# Patient Record
Sex: Male | Born: 1955 | Race: White | Hispanic: Refuse to answer | Marital: Married | Smoking: Current some day smoker
Health system: Southern US, Community
[De-identification: ages and names within clinical notes are randomized; demographics above are authoritative.]

## PROBLEM LIST (undated history)

## (undated) DIAGNOSIS — Z86010 Personal history of colonic polyps: Secondary | ICD-10-CM

## (undated) DIAGNOSIS — I10 Essential (primary) hypertension: Secondary | ICD-10-CM

## (undated) DIAGNOSIS — E785 Hyperlipidemia, unspecified: Secondary | ICD-10-CM

## (undated) DIAGNOSIS — K603 Anal fistula, unspecified: Secondary | ICD-10-CM

## (undated) DIAGNOSIS — N189 Chronic kidney disease, unspecified: Secondary | ICD-10-CM

## (undated) DIAGNOSIS — U071 COVID-19: Secondary | ICD-10-CM

## (undated) DIAGNOSIS — I639 Cerebral infarction, unspecified: Secondary | ICD-10-CM

## (undated) DIAGNOSIS — E119 Type 2 diabetes mellitus without complications: Principal | ICD-10-CM

## (undated) DIAGNOSIS — J189 Pneumonia, unspecified organism: Secondary | ICD-10-CM

## (undated) DIAGNOSIS — Z8719 Personal history of other diseases of the digestive system: Secondary | ICD-10-CM

## (undated) DIAGNOSIS — C679 Malignant neoplasm of bladder, unspecified: Secondary | ICD-10-CM

## (undated) DIAGNOSIS — M1990 Unspecified osteoarthritis, unspecified site: Secondary | ICD-10-CM

## (undated) DIAGNOSIS — L732 Hidradenitis suppurativa: Secondary | ICD-10-CM

## (undated) DIAGNOSIS — L4050 Arthropathic psoriasis, unspecified: Secondary | ICD-10-CM

## (undated) DIAGNOSIS — M25519 Pain in unspecified shoulder: Secondary | ICD-10-CM

## (undated) DIAGNOSIS — Z87442 Personal history of urinary calculi: Secondary | ICD-10-CM

## (undated) HISTORY — DX: Chronic kidney disease, unspecified: N18.9

## (undated) HISTORY — DX: Cerebral infarction, unspecified: I63.9

## (undated) HISTORY — DX: Essential (primary) hypertension: I10

## (undated) HISTORY — PX: TONSILLECTOMY: SUR1361

## (undated) HISTORY — DX: Hidradenitis suppurativa: L73.2

## (undated) HISTORY — DX: Unspecified osteoarthritis, unspecified site: M19.90

## (undated) HISTORY — PX: COLONOSCOPY: SHX174

## (undated) HISTORY — DX: Hyperlipidemia, unspecified: E78.5

## (undated) HISTORY — DX: Pain in unspecified shoulder: M25.519

## (undated) HISTORY — DX: Personal history of colonic polyps: Z86.010

## (undated) HISTORY — DX: Malignant neoplasm of bladder, unspecified: C67.9

## (undated) HISTORY — DX: Type 2 diabetes mellitus without complications: E11.9

---

## 2007-06-06 DIAGNOSIS — Z8551 Personal history of malignant neoplasm of bladder: Secondary | ICD-10-CM

## 2007-06-06 HISTORY — DX: Personal history of malignant neoplasm of bladder: Z85.51

## 2007-06-06 HISTORY — PX: CYSTOSCOPY: SUR368

## 2008-02-04 HISTORY — PX: TRANSURETHRAL RESECTION OF BLADDER TUMOR: SHX2575

## 2009-01-25 ENCOUNTER — Encounter (INDEPENDENT_AMBULATORY_CARE_PROVIDER_SITE_OTHER): Payer: Self-pay | Admitting: *Deleted

## 2009-04-05 ENCOUNTER — Ambulatory Visit: Payer: Self-pay | Admitting: Family Medicine

## 2009-04-05 DIAGNOSIS — E119 Type 2 diabetes mellitus without complications: Secondary | ICD-10-CM

## 2009-04-05 DIAGNOSIS — E785 Hyperlipidemia, unspecified: Secondary | ICD-10-CM

## 2009-04-05 DIAGNOSIS — C679 Malignant neoplasm of bladder, unspecified: Secondary | ICD-10-CM | POA: Insufficient documentation

## 2009-04-05 DIAGNOSIS — M1990 Unspecified osteoarthritis, unspecified site: Secondary | ICD-10-CM

## 2009-04-05 DIAGNOSIS — I10 Essential (primary) hypertension: Secondary | ICD-10-CM | POA: Insufficient documentation

## 2009-04-05 DIAGNOSIS — Z8679 Personal history of other diseases of the circulatory system: Secondary | ICD-10-CM

## 2009-04-05 HISTORY — DX: Hyperlipidemia, unspecified: E78.5

## 2009-04-05 HISTORY — DX: Malignant neoplasm of bladder, unspecified: C67.9

## 2009-04-05 HISTORY — DX: Type 2 diabetes mellitus without complications: E11.9

## 2009-04-05 HISTORY — DX: Personal history of other diseases of the circulatory system: Z86.79

## 2009-04-05 HISTORY — DX: Unspecified osteoarthritis, unspecified site: M19.90

## 2009-04-05 HISTORY — DX: Essential (primary) hypertension: I10

## 2009-04-06 ENCOUNTER — Ambulatory Visit: Payer: Self-pay | Admitting: Family Medicine

## 2009-04-08 ENCOUNTER — Telehealth: Payer: Self-pay | Admitting: Family Medicine

## 2009-04-08 LAB — CONVERTED CEMR LAB
Cholesterol: 206 mg/dL — ABNORMAL HIGH (ref 0–200)
HDL: 37.2 mg/dL — ABNORMAL LOW (ref >39.00)
Triglycerides: 131 mg/dL (ref 0.0–149.0)

## 2009-05-03 ENCOUNTER — Encounter (INDEPENDENT_AMBULATORY_CARE_PROVIDER_SITE_OTHER): Payer: Self-pay | Admitting: *Deleted

## 2009-07-27 ENCOUNTER — Encounter: Payer: Self-pay | Admitting: Family Medicine

## 2009-08-25 ENCOUNTER — Encounter: Payer: Self-pay | Admitting: Family Medicine

## 2010-01-05 ENCOUNTER — Ambulatory Visit: Payer: Self-pay | Admitting: Family Medicine

## 2010-01-05 DIAGNOSIS — M25519 Pain in unspecified shoulder: Secondary | ICD-10-CM

## 2010-01-05 HISTORY — DX: Pain in unspecified shoulder: M25.519

## 2010-01-11 ENCOUNTER — Encounter: Payer: Self-pay | Admitting: Family Medicine

## 2010-03-01 ENCOUNTER — Telehealth: Payer: Self-pay | Admitting: Family Medicine

## 2010-07-05 NOTE — Letter (Signed)
Summary: [facility]  [facility]   Imported By: Maryln Gottron 01/19/2010 10:58:43  _____________________________________________________________________  External Attachment:    Type:   Image     Comment:   External Document

## 2010-07-05 NOTE — Miscellaneous (Signed)
Summary: Accu-Chek testing device dispensed  Pt wife is here with daughter for OV.  Pt has been experiencing evevated blood sugars.  Dr Justine Null requested pt be given a blood sugar testing kit.  Wife instructed on use, Accu-chek dispensed with 20 testing strips.  Pt will schedule OV with Dr Caryl Never in follow-up Sid Falcon LPN  August 25, 2009 10:55 AM  Clinical Lists Changes  Medications: Added new medication of ACCU-CHEK AVIVA  STRP (GLUCOSE BLOOD) as directed - Signed Added new medication of ACCU-CHEK SOFT TOUCH LANCETS  MISC (LANCETS) as directed - Signed Rx of ACCU-CHEK AVIVA  STRP (GLUCOSE BLOOD) as directed;  #50 x 3;  Signed;  Entered by: Sid Falcon LPN;  Authorized by: Evelena Peat MD;  Method used: Electronically to [facility] [id number], [place], [place]  [address], Ph: [phone], Fax: [phone] Rx of ACCU-CHEK SOFT TOUCH LANCETS  MISC (LANCETS) as directed;  #50 x 3;  Signed;  Entered by: Sid Falcon LPN;  Authorized by: Evelena Peat MD;  Method used: Electronically to [facility] [id number], [place], [place]  [address], Ph: [phone], Fax: [phone]    Prescriptions: ACCU-CHEK SOFT TOUCH LANCETS  MISC (LANCETS) as directed  #50 x 3   Entered by:   Sid Falcon LPN   Authorized by:   Evelena Peat MD   Signed by:   Sid Falcon LPN on 66/11/3014   Method used:   Electronically to        [facility] [address] [phone]* (retail)       [address] [place], [place]  [address]       Ph: [phone]       Fax: [phone]   RxID:   [id number] ACCU-CHEK AVIVA  STRP (GLUCOSE BLOOD) as directed  #50 x 3   Entered by:   Sid Falcon LPN   Authorized by:   Evelena Peat MD   Signed by:   Sid Falcon LPN on 85/46/2703   Method used:   Electronically to        [facility] [address] [phone]* (retail)       [address] [place], [place]  [address]       Ph: [phone]       Fax: [phone]   RxID:   [id number]

## 2010-07-05 NOTE — Assessment & Plan Note (Signed)
Summary: CONSULT RE: SHOULDER PAIN ONLY WHEN LIFTING ARM UP/CJR   Vital Signs:  Patient profile:   55 year old male Weight:      226 pounds Temp:     98.0 degrees F oral BP sitting:   110 / 70  (left arm) Cuff size:   large  Vitals Entered By: Sid Falcon LPN (January 05, 2010 11:07 AM) CC: left shoulder pain Is Patient Diabetic? Yes Did you bring your meter with you today? Yes   History of Present Illness: Right shoulder pain for 6 months. No injury. Some gradual worsening with time. Deep achy pain worse with internal rotation and abduction. Denies neck pain, weakness, or numbness. No significant night pain. Occasional Aleve but not sure if this helps. No prior history of shoulder problems  Allergies: 1)  Penicillin V Potassium (Penicillin V Potassium)  Past History:  Past Medical History: Last updated: 04/05/2009 Bladder cancer  2009 Hyperlipidemia Hypertension Kidney stones Hay fever, Alergies Diabetes mellitus, type II Osteoarthritis  Review of Systems      See HPI  Physical Exam  General:  Well-developed,well-nourished,in no acute distress; alert,appropriate and cooperative throughout examination Neck:  No deformities, masses, or tenderness noted. Lungs:  Normal respiratory effort, chest expands symmetrically. Lungs are clear to auscultation, no crackles or wheezes. Heart:  normal rate and regular rhythm.   Extremities:  shoulders are symmetric in appearance. No localized tenderness. Pain with abduction greater than 90 and internal rotation. No triceps or bicipital tenderness. No a.c. joint tenderness   Impression & Recommendations:  Problem # 1:  SHOULDER PAIN (ICD-719.41)  Probable impingement syndrome.  Discussed risks and benefits of steroid injection and pt consents.  Prepped skin with betadine and using sterile technique injected 40 mg depomedrol and 2 cc plain xylocaine without difficulty into subacromial space (post-lateral approach). His updated  medication list for this problem includes:    Ibuprofen 400 Mg Tabs (Ibuprofen) .Marland Kitchen... Take one (1) tablet by mouth three (3) times a day with food  Orders: Joint Aspirate / Injection, Large (20610) Depo- Medrol 40mg  (J1030)  Complete Medication List: 1)  Lisinopril 10 Mg Tabs (Lisinopril) .... Once daily 2)  Accu-chek Aviva Strp (Glucose blood) .... As directed 3)  Accu-chek Soft Touch Lancets Misc (Lancets) .... As directed 4)  Ibuprofen 400 Mg Tabs (Ibuprofen) .... Take one (1) tablet by mouth three (3) times a day with food  Patient Instructions: 1)  Call or touch base in 2 weeks if no improvement 2)  work on gentle range of motion

## 2010-07-05 NOTE — Letter (Signed)
Summary: [facility]  [facility]   Imported By: Sherian Rein 08/05/2009 13:30:15  _____________________________________________________________________  External Attachment:    Type:   Image     Comment:   External Document

## 2010-07-05 NOTE — Progress Notes (Signed)
Summary: REFILL REQUEST Lisinopril, dose question  Phone Note Refill Request Message from:  Patient on March 01, 2010 9:32 AM  Refills Requested: Medication #1:  LISINOPRIL 10 MG TABS once daily   Notes: Education officer, environmental...Marland KitchenMarland KitchenMarland Kitchen Pts wife adv that Dr Caryl Never told her husband that he could take 5mg  instead of 10mg .    Initial call taken by: Debbra Riding,  March 01, 2010 9:32 AM  Follow-up for Phone Call        I believe pt should have return OV, please confirm dose for pt. Sid Falcon LPN  March 01, 2010 11:20 AM We did discuss reducing lisinopril to one half tablet daily since BPs have been well controlled. Follow-up by: Evelena Peat MD,  March 01, 2010 12:37 PM    New/Updated Medications: LISINOPRIL 10 MG TABS (LISINOPRIL) 1/2 tab daily Prescriptions: LISINOPRIL 10 MG TABS (LISINOPRIL) 1/2 tab daily  #135 x 3   Entered by:   Sid Falcon LPN   Authorized by:   Evelena Peat MD   Signed by:   Sid Falcon LPN on 16/03/9603   Method used:   Faxed to ...       [facility] (retail)             , [place]         Ph: [phone]       Fax: [phone]   RxID:   [id number]

## 2011-05-03 ENCOUNTER — Ambulatory Visit (INDEPENDENT_AMBULATORY_CARE_PROVIDER_SITE_OTHER): Payer: 59 | Admitting: Family Medicine

## 2011-05-03 ENCOUNTER — Encounter: Payer: Self-pay | Admitting: Family Medicine

## 2011-05-03 DIAGNOSIS — E785 Hyperlipidemia, unspecified: Secondary | ICD-10-CM

## 2011-05-03 DIAGNOSIS — M1990 Unspecified osteoarthritis, unspecified site: Secondary | ICD-10-CM

## 2011-05-03 DIAGNOSIS — I10 Essential (primary) hypertension: Secondary | ICD-10-CM

## 2011-05-03 DIAGNOSIS — E119 Type 2 diabetes mellitus without complications: Secondary | ICD-10-CM

## 2011-05-03 LAB — LIPID PANEL
LDL Cholesterol: 107 mg/dL — ABNORMAL HIGH (ref 0–99)
Total CHOL/HDL Ratio: 4
VLDL: 19 mg/dL (ref 0.0–40.0)

## 2011-05-03 LAB — BASIC METABOLIC PANEL
BUN: 12 mg/dL (ref 6–23)
Chloride: 107 mEq/L (ref 96–112)
Creatinine, Ser: 0.9 mg/dL (ref 0.4–1.5)
Glucose, Bld: 125 mg/dL — ABNORMAL HIGH (ref 70–99)
Potassium: 4.9 mEq/L (ref 3.5–5.1)

## 2011-05-03 LAB — HEPATIC FUNCTION PANEL
Bilirubin, Direct: 0 mg/dL (ref 0.0–0.3)
Total Bilirubin: 0.7 mg/dL (ref 0.3–1.2)

## 2011-05-03 MED ORDER — GLUCOSE BLOOD VI STRP: ORAL_STRIP | Status: DC

## 2011-05-03 MED ORDER — MELOXICAM 15 MG PO TABS: 15.0000 mg | ORAL_TABLET | Freq: Every day | ORAL | Status: DC

## 2011-05-03 NOTE — Progress Notes (Signed)
  Subjective:    Patient ID: Joshua [name], male    DOB: 09-11-1955, 55 y.o.   MRN: [id number]  HPI  Medical followup. Patient has history of osteoarthritis, remote history of bladder cancer, type 2 diabetes, hyperlipidemia, and history of hypertension. He is currently taking no prescription medications. Not monitoring blood sugars. Needs test strips refilled. Has lost about 20 pounds due to exercise efforts. Overall feels well but is having some bilateral hand and foot pain. Most involving toes DIP and PIP joints of the hand. No signs of inflammation such as erythema or warmth. No other joint pains. Uses ibuprofen and Aleve with some relief. He has early morning stiffness and pain which improves with activity.  Patient denies any chest pains or dyspnea. No symptoms of hyper or hypoglycemia.  Past Medical History  Diagnosis Date  . BLADDER CANCER 04/05/2009  . DIABETES MELLITUS, TYPE II 04/05/2009  . HYPERLIPIDEMIA 04/05/2009  . HYPERTENSION 04/05/2009  . OSTEOARTHRITIS 04/05/2009  . SHOULDER PAIN 01/05/2010   Past Surgical History  Procedure Date  . Cystoscopy 2009    tumor on bladder    reports that he has quit smoking. His smoking use included Cigarettes. He has a 5 pack-year smoking history. He does not have any smokeless tobacco history on file. His alcohol and drug histories not on file. family history includes Cancer in his other and Hypertension in his other. Allergies  Allergen Reactions  . Penicillins     REACTION: hives, fever blisters      Review of Systems  Constitutional: Negative for fever, chills, fatigue and unexpected weight change.  Respiratory: Negative for cough and shortness of breath.   Cardiovascular: Negative for chest pain, palpitations and leg swelling.  Musculoskeletal: Positive for arthralgias.  Neurological: Negative for dizziness and headaches.  Psychiatric/Behavioral: Negative for dysphoric mood.       Objective:   Physical Exam  Constitutional:  He appears well-developed and well-nourished. No distress.  HENT:  Mouth/Throat: Oropharynx is clear and moist.  Neck: Neck supple. No thyromegaly present.  Cardiovascular: Normal rate and regular rhythm.   Pulmonary/Chest: Effort normal and breath sounds normal. No respiratory distress. He has no wheezes. He has no rales.  Musculoskeletal: Normal range of motion. He exhibits no edema and no tenderness.  Lymphadenopathy:    He has no cervical adenopathy.          Assessment & Plan:  #1 osteoarthritis most involving hands. Trial of Meloxicam 15 mg one daily and reviewed possible side effects #2 history of type 2 diabetes. Recheck A1c. Continue weight loss efforts.   test strips ordered #3 hyperlipidemia recheck lipids

## 2011-05-04 NOTE — Progress Notes (Signed)
Quick Note:  Pt informed ______ 

## 2011-07-03 ENCOUNTER — Telehealth: Payer: Self-pay | Admitting: Family Medicine

## 2011-07-03 MED ORDER — LISINOPRIL 5 MG PO TABS: 5.0000 mg | ORAL_TABLET | Freq: Every day | ORAL | Status: DC

## 2011-07-03 NOTE — Telephone Encounter (Signed)
Pt req refill of lisinopril (PRINIVIL,ZESTRIL) 5 MG tablet to [facility] 1 year script. Pls call in today. Pt will be out of med on Thursday.

## 2011-12-28 ENCOUNTER — Encounter: Payer: Self-pay | Admitting: Family Medicine

## 2011-12-28 ENCOUNTER — Ambulatory Visit (INDEPENDENT_AMBULATORY_CARE_PROVIDER_SITE_OTHER): Payer: 59 | Admitting: Family Medicine

## 2011-12-28 VITALS — BP 110/70 | Temp 98.6°F | Ht 68.5 in | Wt 219.0 lb

## 2011-12-28 DIAGNOSIS — E119 Type 2 diabetes mellitus without complications: Secondary | ICD-10-CM

## 2011-12-28 DIAGNOSIS — E785 Hyperlipidemia, unspecified: Secondary | ICD-10-CM

## 2011-12-28 DIAGNOSIS — M25539 Pain in unspecified wrist: Secondary | ICD-10-CM

## 2011-12-28 DIAGNOSIS — M25532 Pain in left wrist: Secondary | ICD-10-CM

## 2011-12-28 DIAGNOSIS — I10 Essential (primary) hypertension: Secondary | ICD-10-CM

## 2011-12-28 LAB — BASIC METABOLIC PANEL
CO2: 26 mEq/L (ref 19–32)
Calcium: 9.5 mg/dL (ref 8.4–10.5)
Creatinine, Ser: 0.9 mg/dL (ref 0.4–1.5)
Glucose, Bld: 105 mg/dL — ABNORMAL HIGH (ref 70–99)
Sodium: 139 mEq/L (ref 135–145)

## 2011-12-28 LAB — LIPID PANEL
HDL: 47.9 mg/dL (ref >39.00)
Triglycerides: 117 mg/dL (ref 0.0–149.0)

## 2011-12-28 MED ORDER — CLOTRIMAZOLE-BETAMETHASONE 1-0.05 % EX CREA: TOPICAL_CREAM | Freq: Two times a day (BID) | CUTANEOUS | Status: AC

## 2011-12-28 NOTE — Progress Notes (Signed)
  Subjective:    Patient ID: Joshua [name], male    DOB: 04-Sep-1955, 56 y.o.   MRN: [id number]  HPI  Patient seen for medical followup. History of type 2 diabetes, hypertension, and hyperlipidemia. Currently takes low-dose lisinopril 5 mg daily but no other medications. He has not been monitoring blood sugars recently. Last A1c 6.4%. No symptoms of hyperglycemia. No consistent exercise. Poor compliance with diet at times. Blood pressure has been well controlled. No orthostasis. Patient brings in form from his employer requesting basic things like height and weight along with blood sugar and lipids. Patient is fasting today.  He continues to have problems with joint pains mostly involving the hands and has recently had some increased left wrist pain. No edema. No warmth. Possibly related to golf injury. Location is ulnar aspect of wrist  Past Medical History  Diagnosis Date  . BLADDER CANCER 04/05/2009  . DIABETES MELLITUS, TYPE II 04/05/2009  . HYPERLIPIDEMIA 04/05/2009  . HYPERTENSION 04/05/2009  . OSTEOARTHRITIS 04/05/2009  . SHOULDER PAIN 01/05/2010   Past Surgical History  Procedure Date  . Cystoscopy 2009    tumor on bladder    reports that he has quit smoking. His smoking use included Cigarettes. He has a 5 pack-year smoking history. He does not have any smokeless tobacco history on file. His alcohol and drug histories not on file. family history includes Cancer in his other and Hypertension in his other. Allergies  Allergen Reactions  . Penicillins     REACTION: hives, fever blisters      Review of Systems  Constitutional: Negative for fatigue.  Eyes: Negative for visual disturbance.  Respiratory: Negative for cough, chest tightness and shortness of breath.   Cardiovascular: Negative for chest pain, palpitations and leg swelling.  Neurological: Negative for dizziness, syncope, weakness, light-headedness and headaches.       Objective:   Physical Exam  Constitutional: He  appears well-developed and well-nourished.  Cardiovascular: Normal rate and regular rhythm.   Pulmonary/Chest: Effort normal and breath sounds normal. No respiratory distress. He has no wheezes. He has no rales.  Musculoskeletal: He exhibits no edema.       Left wrist reveals full range of motion. Minimal tenderness just distal to left ulna. No bony tenderness. No warmth or erythema.          Assessment & Plan:  #1type 2 diabetes. Diet controlled. Recheck A1c. Establish more consistent exercise  #2 hypertension adequate control  Continue low-dose lisinopril  #3 history of hyperlipidemia. Recheck lipid and hepatic panel  #4 left wrist pain. Question ligamentous strain. Try over-the-counter wrist splint and continue Aleve and consider orthopedic referral if not improving over the next few weeks

## 2012-01-02 NOTE — Progress Notes (Signed)
Quick Note:  Labs mailed to pt home to complete form ______

## 2012-07-17 ENCOUNTER — Other Ambulatory Visit: Payer: Self-pay | Admitting: *Deleted

## 2012-07-17 MED ORDER — LISINOPRIL 5 MG PO TABS: 5.0000 mg | ORAL_TABLET | Freq: Every day | ORAL | Status: DC

## 2012-07-18 ENCOUNTER — Ambulatory Visit: Payer: 59 | Admitting: Family Medicine

## 2012-07-26 ENCOUNTER — Ambulatory Visit: Payer: 59 | Admitting: Family Medicine

## 2012-08-01 ENCOUNTER — Encounter: Payer: Self-pay | Admitting: Family Medicine

## 2012-08-01 ENCOUNTER — Ambulatory Visit (INDEPENDENT_AMBULATORY_CARE_PROVIDER_SITE_OTHER): Payer: 59 | Admitting: Family Medicine

## 2012-08-01 VITALS — BP 122/78 | Temp 98.0°F | Wt 228.0 lb

## 2012-08-01 DIAGNOSIS — I10 Essential (primary) hypertension: Secondary | ICD-10-CM

## 2012-08-01 MED ORDER — LISINOPRIL 5 MG PO TABS: 5.0000 mg | ORAL_TABLET | Freq: Every day | ORAL | Status: DC

## 2012-08-01 NOTE — Progress Notes (Signed)
  Subjective:    Patient ID: Joshua [name], male    DOB: [birth year], 58 y.o.   MRN: [id number]  HPI Followup hypertension. Blood pressure controlled with very low-dose lisinopril 5 mg daily Has tried in the past discontinuing this but blood pressures rebounded. Generally feels well. Does have history of type 2 diabetes which is been managed with diet In consistent exercise. Last A1c 6.6%. Preparing to have labs through his work including A1c. Weight is up and down  Past Medical History  Diagnosis Date  . BLADDER CANCER 04/05/2009  . DIABETES MELLITUS, TYPE II 04/05/2009  . HYPERLIPIDEMIA 04/05/2009  . HYPERTENSION 04/05/2009  . OSTEOARTHRITIS 04/05/2009  . SHOULDER PAIN 01/05/2010   Past Surgical History  Procedure Laterality Date  . Cystoscopy  2009    tumor on bladder    reports that he has quit smoking. His smoking use included Cigarettes. He has a 5 pack-year smoking history. He does not have any smokeless tobacco history on file. His alcohol and drug histories are not on file. family history includes Cancer in his other and Hypertension in his other. Allergies  Allergen Reactions  . Penicillins     REACTION: hives, fever blisters      Review of Systems  Constitutional: Negative for chills, fatigue and unexpected weight change.  Eyes: Negative for visual disturbance.  Respiratory: Negative for cough, chest tightness and shortness of breath.   Cardiovascular: Negative for chest pain, palpitations and leg swelling.  Neurological: Negative for dizziness, syncope, weakness, light-headedness and headaches.       Objective:   Physical Exam  Constitutional: He appears well-developed and well-nourished.  Neck: Neck supple. No thyromegaly present.  Cardiovascular: Normal rate and regular rhythm.   Pulmonary/Chest: Effort normal and breath sounds normal. No respiratory distress. He has no wheezes. He has no rales.  Musculoskeletal: He exhibits no edema.  Lymphadenopathy:    He  has no cervical adenopathy.          Assessment & Plan:  Hypertension. Well controlled. Refill lisinopril for one year. Patient will have his upcoming lab work next week faxed over for [place] to review

## 2012-09-26 ENCOUNTER — Other Ambulatory Visit: Payer: Self-pay | Admitting: Family Medicine

## 2013-06-05 HISTORY — PX: EXTRACORPOREAL SHOCK WAVE LITHOTRIPSY: SHX1557

## 2013-12-17 ENCOUNTER — Other Ambulatory Visit (INDEPENDENT_AMBULATORY_CARE_PROVIDER_SITE_OTHER): Payer: 59

## 2013-12-17 DIAGNOSIS — Z0000 Encounter for general adult medical examination without abnormal findings: Secondary | ICD-10-CM

## 2013-12-17 LAB — POCT URINALYSIS DIPSTICK
Bilirubin, UA: NEGATIVE
Glucose, UA: NEGATIVE
KETONES UA: NEGATIVE
Leukocytes, UA: NEGATIVE
Nitrite, UA: NEGATIVE
PH UA: 6
RBC UA: NEGATIVE
SPEC GRAV UA: 1.02
UROBILINOGEN UA: 0.2

## 2013-12-17 LAB — LIPID PANEL
CHOL/HDL RATIO: 3
CHOLESTEROL: 164 mg/dL (ref 0–200)
HDL: 47.7 mg/dL (ref >39.00)
LDL CALC: 104 mg/dL — AB (ref 0–99)
NonHDL: 116.3
TRIGLYCERIDES: 63 mg/dL (ref 0.0–149.0)
VLDL: 12.6 mg/dL (ref 0.0–40.0)

## 2013-12-17 LAB — CBC WITH DIFFERENTIAL/PLATELET
BASOS PCT: 0.4 % (ref 0.0–3.0)
Basophils Absolute: 0 10*3/uL (ref 0.0–0.1)
EOS PCT: 0.8 % (ref 0.0–5.0)
Eosinophils Absolute: 0.1 10*3/uL (ref 0.0–0.7)
HCT: 46.3 % (ref 39.0–52.0)
Hemoglobin: 15.5 g/dL (ref 13.0–17.0)
LYMPHS PCT: 23.5 % (ref 12.0–46.0)
Lymphs Abs: 1.8 10*3/uL (ref 0.7–4.0)
MCHC: 33.5 g/dL (ref 30.0–36.0)
MCV: 93.3 fl (ref 78.0–100.0)
MONOS PCT: 4.5 % (ref 3.0–12.0)
Monocytes Absolute: 0.3 10*3/uL (ref 0.1–1.0)
NEUTROS PCT: 70.8 % (ref 43.0–77.0)
Neutro Abs: 5.4 10*3/uL (ref 1.4–7.7)
PLATELETS: 237 10*3/uL (ref 150.0–400.0)
RBC: 4.96 Mil/uL (ref 4.22–5.81)
RDW: 12.7 % (ref 11.5–15.5)
WBC: 7.6 10*3/uL (ref 4.0–10.5)

## 2013-12-17 LAB — HEPATIC FUNCTION PANEL
ALBUMIN: 4.1 g/dL (ref 3.5–5.2)
ALK PHOS: 85 U/L (ref 39–117)
ALT: 23 U/L (ref 0–53)
AST: 26 U/L (ref 0–37)
Bilirubin, Direct: 0.1 mg/dL (ref 0.0–0.3)
TOTAL PROTEIN: 7.6 g/dL (ref 6.0–8.3)
Total Bilirubin: 0.7 mg/dL (ref 0.2–1.2)

## 2013-12-17 LAB — BASIC METABOLIC PANEL
BUN: 17 mg/dL (ref 6–23)
CALCIUM: 9.9 mg/dL (ref 8.4–10.5)
CHLORIDE: 103 meq/L (ref 96–112)
CO2: 25 mEq/L (ref 19–32)
CREATININE: 1 mg/dL (ref 0.4–1.5)
GFR: 82.46 mL/min (ref >60.00)
Glucose, Bld: 148 mg/dL — ABNORMAL HIGH (ref 70–99)
Potassium: 4.8 mEq/L (ref 3.5–5.1)
SODIUM: 139 meq/L (ref 135–145)

## 2013-12-17 LAB — PSA: PSA: 1.63 ng/mL (ref 0.10–4.00)

## 2013-12-17 LAB — TSH: TSH: 1.67 u[IU]/mL (ref 0.35–4.50)

## 2013-12-26 ENCOUNTER — Ambulatory Visit (INDEPENDENT_AMBULATORY_CARE_PROVIDER_SITE_OTHER): Payer: 59 | Admitting: Family Medicine

## 2013-12-26 ENCOUNTER — Encounter: Payer: Self-pay | Admitting: Family Medicine

## 2013-12-26 VITALS — BP 120/70 | HR 63 | Temp 98.6°F | Ht 68.5 in | Wt 193.0 lb

## 2013-12-26 DIAGNOSIS — Z23 Encounter for immunization: Secondary | ICD-10-CM

## 2013-12-26 DIAGNOSIS — Z0000 Encounter for general adult medical examination without abnormal findings: Secondary | ICD-10-CM

## 2013-12-26 DIAGNOSIS — E119 Type 2 diabetes mellitus without complications: Secondary | ICD-10-CM

## 2013-12-26 LAB — HEMOGLOBIN A1C: Hgb A1c MFr Bld: 5.9 % (ref 4.6–6.5)

## 2013-12-26 NOTE — Progress Notes (Signed)
Pre visit review using our clinic review tool, if applicable. No additional management support is needed unless otherwise documented below in the visit note. 

## 2013-12-26 NOTE — Patient Instructions (Signed)
We will call you regarding screening colonoscopy.

## 2013-12-26 NOTE — Progress Notes (Signed)
   Subjective:    Patient ID: Joshua [name], male    DOB: [birth year], 58 y.o.   MRN: [id number]  HPI Patient seen for complete physical. He has history of type 2 diabetes, hypertension, dyslipidemia. He also has history of bladder cancer and is followed closely by urology. He has osteoarthritis mostly involving hands and feet. He has made dramatic lifestyle changes during the past year. Has lost about 45 pounds and blood sugars have tremendously improved. He has tapered himself off lisinopril for hypertension. He currently runs 3-4 miles most days of the week. Last tetanus unknown.  Smoked only briefly several years ago. Fasting blood sugars stable between 100 to 125 usually. No symptoms of hyperglycemia. He has never had screening colonoscopy.  Past Medical History  Diagnosis Date  . BLADDER CANCER 04/05/2009  . DIABETES MELLITUS, TYPE II 04/05/2009  . HYPERLIPIDEMIA 04/05/2009  . HYPERTENSION 04/05/2009  . OSTEOARTHRITIS 04/05/2009  . SHOULDER PAIN 01/05/2010   Past Surgical History  Procedure Laterality Date  . Cystoscopy  2009    tumor on bladder    reports that he has quit smoking. His smoking use included Cigarettes. He has a 5 pack-year smoking history. He does not have any smokeless tobacco history on file. His alcohol and drug histories are not on file. family history includes Cancer in his other; Hypertension in his other. Allergies  Allergen Reactions  . Penicillins     REACTION: hives, fever blisters      Review of Systems  Constitutional: Negative for fever, activity change, appetite change and fatigue.  HENT: Negative for congestion, ear pain and trouble swallowing.   Eyes: Negative for pain and visual disturbance.  Respiratory: Negative for cough, shortness of breath and wheezing.   Cardiovascular: Negative for chest pain and palpitations.  Gastrointestinal: Negative for nausea, vomiting, abdominal pain, diarrhea, constipation, blood in stool, abdominal distention and  rectal pain.  Genitourinary: Negative for dysuria, hematuria and testicular pain.  Musculoskeletal: Negative for arthralgias and joint swelling.  Skin: Negative for rash.  Neurological: Negative for dizziness, syncope and headaches.  Hematological: Negative for adenopathy.  Psychiatric/Behavioral: Negative for confusion and dysphoric mood.       Objective:   Physical Exam  Constitutional: He is oriented to person, place, and time. He appears well-developed and well-nourished. No distress.  HENT:  Head: Normocephalic and atraumatic.  Right Ear: External ear normal.  Left Ear: External ear normal.  Mouth/Throat: Oropharynx is clear and moist.  Eyes: Conjunctivae and EOM are normal. Pupils are equal, round, and reactive to light.  Neck: Normal range of motion. Neck supple. No thyromegaly present.  Cardiovascular: Normal rate, regular rhythm and normal heart sounds.   No murmur heard. Pulmonary/Chest: No respiratory distress. He has no wheezes. He has no rales.  Abdominal: Soft. Bowel sounds are normal. He exhibits no distension and no mass. There is no tenderness. There is no rebound and no guarding.  Genitourinary:  Per urology  Musculoskeletal: He exhibits no edema.  Lymphadenopathy:    He has no cervical adenopathy.  Neurological: He is alert and oriented to person, place, and time. He displays normal reflexes. No cranial nerve deficit.  Skin: No rash noted.  Psychiatric: He has a normal mood and affect.          Assessment & Plan:  Complete physical. Labs reviewed. Unfortunately, A1c was not obtained. Add A1c. Continue healthy lifestyle habits. Tetanus booster given. Schedule screening colonoscopy. Continue close followup with urology

## 2013-12-31 ENCOUNTER — Encounter: Payer: Self-pay | Admitting: Internal Medicine

## 2014-02-25 ENCOUNTER — Ambulatory Visit (AMBULATORY_SURGERY_CENTER): Payer: Self-pay

## 2014-02-25 VITALS — Ht 68.0 in | Wt 187.6 lb

## 2014-02-25 DIAGNOSIS — Z1211 Encounter for screening for malignant neoplasm of colon: Secondary | ICD-10-CM

## 2014-02-25 NOTE — Progress Notes (Signed)
No allergies to eggs or soy No past problems with anesthesia No home oxygen No diet/weight loss meds  Has email  Emmi instructions given for colonoscopy 

## 2014-03-12 ENCOUNTER — Encounter: Payer: Self-pay | Admitting: Internal Medicine

## 2014-03-12 ENCOUNTER — Ambulatory Visit (AMBULATORY_SURGERY_CENTER): Payer: 59 | Admitting: Internal Medicine

## 2014-03-12 VITALS — BP 110/64 | HR 52 | Temp 96.8°F | Resp 13 | Ht 68.0 in | Wt 187.0 lb

## 2014-03-12 DIAGNOSIS — D123 Benign neoplasm of transverse colon: Secondary | ICD-10-CM

## 2014-03-12 DIAGNOSIS — Z1211 Encounter for screening for malignant neoplasm of colon: Secondary | ICD-10-CM

## 2014-03-12 DIAGNOSIS — Z860100 Personal history of colon polyps, unspecified: Secondary | ICD-10-CM | POA: Insufficient documentation

## 2014-03-12 HISTORY — DX: Personal history of colonic polyps: Z86.010

## 2014-03-12 HISTORY — DX: Personal history of colon polyps, unspecified: Z86.0100

## 2014-03-12 MED ORDER — SODIUM CHLORIDE 0.9 % IV SOLN: 500.0000 mL | INTRAVENOUS | Status: DC

## 2014-03-12 NOTE — Op Note (Signed)
[facility]  [address] [place], [address]   COLONOSCOPY PROCEDURE REPORT  PATIENT: Joshua [name], Joshua [name]  MR#: [id number] BIRTHDATE: 1955/08/31 , 21  yrs. old GENDER: male ENDOSCOPIST: Gatha Mayer, MD, [facility] PROCEDURE DATE:  03/12/2014 PROCEDURE:   Colonoscopy with snare polypectomy First Screening Colonoscopy - Avg.  risk and is 50 yrs.  old or older Yes.  Prior Negative Screening - Now for repeat screening. N/A  History of Adenoma - Now for follow-up colonoscopy & has been > or = to 3 yrs.  N/A ASA CLASS:   Class II INDICATIONS:average risk for colorectal cancer and first colonoscopy. MEDICATIONS: Propofol 230 mg IV and Monitored anesthesia care  DESCRIPTION OF PROCEDURE:   After the risks benefits and alternatives of the procedure were thoroughly explained, informed consent was obtained.  The digital rectal exam revealed no abnormalities of the rectum, revealed no prostatic nodules, and revealed the prostate was not enlarged.   The [id number] endoscope was introduced through the anus and advanced to the   . No adverse events experienced.   The quality of the prep was excellent, using MiraLax  The instrument was then slowly withdrawn as the colon was fully examined.   COLON FINDINGS: A sessile polyp measuring 7 mm in size was found in the transverse colon.  A polypectomy was performed with a cold snare.  The resection was complete, the polyp tissue was completely retrieved and sent to histology.   There was moderate diverticulosis noted in the sigmoid colon.   The examination was otherwise normal.   Right colon retroflexion included.  Retroflexed views revealed no abnormalities. The time to cecum=5 minutes 16 seconds.  Withdrawal time=6 minutes 32 seconds.  The scope was withdrawn and the procedure completed. COMPLICATIONS: There were no immediate complications.  ENDOSCOPIC IMPRESSION: 1.   Sessile polyp measuring 7 mm in size was found in  the transverse colon; polypectomy was performed with a cold snare 2.   Moderate diverticulosis was noted in the sigmoid colon 3.   The examination was otherwise normal  RECOMMENDATIONS: Timing of repeat colonoscopy will be determined by pathology findings.  eSigned:  Gatha Mayer, MD, [facility] 03/12/2014 11:14 AM   cc: Carolann Littler, MD and The Patient

## 2014-03-12 NOTE — Patient Instructions (Addendum)
I found and removed 1 polyp that looks benign. You also have a condition called diverticulosis - common and not usually a problem. Please read the handout provided.  I will let you know pathology results and when to have another routine colonoscopy by mail.  I appreciate the opportunity to care for you. Gatha Mayer, MD, FACG  YOU HAD AN ENDOSCOPIC PROCEDURE TODAY AT [facility]: Refer to the procedure report that was given to you for any specific questions about what was found during the examination.  If the procedure report does not answer your questions, please call your gastroenterologist to clarify.  If you requested that your care partner not be given the details of your procedure findings, then the procedure report has been included in a sealed envelope for you to review at your convenience later.  YOU SHOULD EXPECT: Some feelings of bloating in the abdomen. Passage of more gas than usual.  Walking can help get rid of the air that was put into your GI tract during the procedure and reduce the bloating. If you had a lower endoscopy (such as a colonoscopy or flexible sigmoidoscopy) you may notice spotting of blood in your stool or on the toilet paper. If you underwent a bowel prep for your procedure, then you may not have a normal bowel movement for a few days.  DIET: Your first meal following the procedure should be a light meal and then it is ok to progress to your normal diet.  A half-sandwich or bowl of soup is an example of a good first meal.  Heavy or fried foods are harder to digest and may make you feel nauseous or bloated.  Likewise meals heavy in dairy and vegetables can cause extra gas to form and this can also increase the bloating.  Drink plenty of fluids but you should avoid alcoholic beverages for 24 hours.  ACTIVITY: Your care partner should take you home directly after the procedure.  You should plan to take it easy, moving slowly for the rest of the  day.  You can resume normal activity the day after the procedure however you should NOT DRIVE or use heavy machinery for 24 hours (because of the sedation medicines used during the test).    SYMPTOMS TO REPORT IMMEDIATELY: A gastroenterologist can be reached at any hour.  During normal business hours, 8:30 AM to 5:00 PM Monday through Friday, call [phone].  After hours and on weekends, please call the GI answering service at [phone] who will take a message and have the physician on call contact you.   Following lower endoscopy (colonoscopy or flexible sigmoidoscopy):  Excessive amounts of blood in the stool  Significant tenderness or worsening of abdominal pains  Swelling of the abdomen that is new, acute  Fever of 100F or higher  FOLLOW UP: If any biopsies were taken you will be contacted by phone or by letter within the next 1-3 weeks.  Call your gastroenterologist if you have not heard about the biopsies in 3 weeks.  Our staff will call the home number listed on your records the next business day following your procedure to check on you and address any questions or concerns that you may have at that time regarding the information given to you following your procedure. This is a courtesy call and so if there is no answer at the home number and we have not heard from you through the emergency physician on call, we will  assume that you have returned to your regular daily activities without incident.  SIGNATURES/CONFIDENTIALITY: You and/or your care partner have signed paperwork which will be entered into your electronic medical record.  These signatures attest to the fact that that the information above on your After Visit Summary has been reviewed and is understood.  Full responsibility of the confidentiality of this discharge information lies with you and/or your care-partner.  Await pathology  Please continue your normal medications  Please read over handouts about polyps and  diverticulosis

## 2014-03-12 NOTE — Progress Notes (Signed)
Called to room to assist during endoscopic procedure.  Patient ID and intended procedure confirmed with present staff. Received instructions for my participation in the procedure from the performing physician.  

## 2014-03-12 NOTE — Progress Notes (Signed)
Report to PACU, RN, vss, BBS= Clear.  

## 2014-03-13 ENCOUNTER — Telehealth: Payer: Self-pay | Admitting: *Deleted

## 2014-03-13 NOTE — Telephone Encounter (Signed)
  Follow up Call-  Call back number 03/12/2014  Post procedure Call Back phone  # [phone] cell  Permission to leave phone message Yes     Patient questions:  Do you have a fever, pain , or abdominal swelling? No. Pain Score  0 *  Have you tolerated food without any problems? Yes.    Have you been able to return to your normal activities? Yes.    Do you have any questions about your discharge instructions: Diet   No. Medications  No. Follow up visit  No.  Do you have questions or concerns about your Care? No.  Actions: * If pain score is 4 or above: No action needed, pain <4.

## 2014-03-18 ENCOUNTER — Encounter: Payer: Self-pay | Admitting: Internal Medicine

## 2014-03-24 ENCOUNTER — Other Ambulatory Visit: Payer: Self-pay | Admitting: Urology

## 2014-04-03 ENCOUNTER — Encounter (HOSPITAL_COMMUNITY): Payer: Self-pay | Admitting: Pharmacy Technician

## 2014-04-06 ENCOUNTER — Ambulatory Visit (HOSPITAL_COMMUNITY): Payer: 59

## 2014-04-06 ENCOUNTER — Encounter (HOSPITAL_COMMUNITY): Payer: Self-pay | Admitting: *Deleted

## 2014-04-06 ENCOUNTER — Encounter (HOSPITAL_COMMUNITY)
Admission: RE | Disposition: A | Discharge status: Home / Self Care | Payer: Self-pay | Source: Ambulatory Visit | Attending: Urology

## 2014-04-06 ENCOUNTER — Ambulatory Visit (HOSPITAL_COMMUNITY)
Admission: RE | Admit: 2014-04-06 | Discharge: 2014-04-06 | Disposition: A | Discharge status: Home / Self Care | Payer: 59 | Source: Ambulatory Visit | Attending: Urology | Admitting: Urology

## 2014-04-06 DIAGNOSIS — Z8551 Personal history of malignant neoplasm of bladder: Secondary | ICD-10-CM | POA: Insufficient documentation

## 2014-04-06 DIAGNOSIS — I10 Essential (primary) hypertension: Secondary | ICD-10-CM | POA: Diagnosis not present

## 2014-04-06 DIAGNOSIS — M1990 Unspecified osteoarthritis, unspecified site: Secondary | ICD-10-CM | POA: Diagnosis not present

## 2014-04-06 DIAGNOSIS — E785 Hyperlipidemia, unspecified: Secondary | ICD-10-CM | POA: Insufficient documentation

## 2014-04-06 DIAGNOSIS — N200 Calculus of kidney: Secondary | ICD-10-CM | POA: Diagnosis present

## 2014-04-06 DIAGNOSIS — E119 Type 2 diabetes mellitus without complications: Secondary | ICD-10-CM | POA: Diagnosis not present

## 2014-04-06 DIAGNOSIS — Z87891 Personal history of nicotine dependence: Secondary | ICD-10-CM | POA: Insufficient documentation

## 2014-04-06 SURGERY — LITHOTRIPSY, ESWL
Anesthesia: LOCAL | Laterality: Right

## 2014-04-06 MED ORDER — OXYCODONE-ACETAMINOPHEN 5-325 MG PO TABS
1.0000 | ORAL_TABLET | Freq: Once | ORAL | Status: AC
  Administered 2014-04-06: 1 via ORAL
  Filled 2014-04-06: qty 1

## 2014-04-06 MED ORDER — DIPHENHYDRAMINE HCL 25 MG PO CAPS
25.0000 mg | ORAL_CAPSULE | ORAL | Status: AC
  Administered 2014-04-06: 25 mg via ORAL
  Filled 2014-04-06: qty 1

## 2014-04-06 MED ORDER — DIAZEPAM 5 MG PO TABS
10.0000 mg | ORAL_TABLET | ORAL | Status: AC
  Administered 2014-04-06: 10 mg via ORAL
  Filled 2014-04-06: qty 2

## 2014-04-06 MED ORDER — CIPROFLOXACIN HCL 500 MG PO TABS
500.0000 mg | ORAL_TABLET | ORAL | Status: AC
Start: 2014-04-06 — End: 2014-04-06
  Administered 2014-04-06: 500 mg via ORAL
  Filled 2014-04-06: qty 1

## 2014-04-06 MED ORDER — OXYCODONE-ACETAMINOPHEN 10-325 MG PO TABS: 1.0000 | ORAL_TABLET | ORAL | Status: DC | PRN

## 2014-04-06 MED ORDER — OXYCODONE HCL 5 MG PO TABS
5.0000 mg | ORAL_TABLET | Freq: Once | ORAL | Status: AC
Start: 2014-04-06 — End: 2014-04-06
  Administered 2014-04-06: 5 mg via ORAL
  Filled 2014-04-06: qty 1

## 2014-04-06 MED ORDER — SODIUM CHLORIDE 0.9 % IV SOLN
INTRAVENOUS | Status: DC
  Administered 2014-04-06: 1000 mL via INTRAVENOUS

## 2014-04-06 MED ORDER — TAMSULOSIN HCL 0.4 MG PO CAPS: 0.4000 mg | ORAL_CAPSULE | ORAL | Status: DC

## 2014-04-06 NOTE — Op Note (Signed)
See [facility] OP note scanned into chart. 

## 2014-04-06 NOTE — H&P (Signed)
History of Present Illness History of transitional cell carcinoma of the bladder - This was diagnosed in 9/09 after an episode of gross hematuria and treated with transurethral resection. The pathology revealed low grade, superficial transitional cell carcinoma. He has not had a recurrence.        He has a history of nephrolithiasis that was diagnosed at the time of his workup for his hematuria in '09. Bilateral stones were identified but he has never passed a stone nor has he had any flank pain. On KUB he has a single stone seen in the lower pole of his right kidney and no stones can be seen on the left-hand side.        There is a family history of prostate cancer. His brother was diagnosed with this disease.     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  Current therapy: Cialis 5 mg    Interval history: He reports that about 2 months ago he noted some rust-colored urine. He then had a similar episode about 2 weeks ago and then yesterday, after going out for a run, he noted grossly bloody urine. He has not been having any flank pain. He denies any new voiding symptoms. His gross hematuria would be considered moderate severity with no modifying factors or associated signs and symptoms.   Past Medical History Problems  1. History of Diabetes mellitus type 2, controlled, without complications (Y85.0) 2. Former smoker [phone]) 3. History of hyperlipidemia (Z86.39) 4. History of hypertension (Z86.79) 5. History of Osteoarthritis 6. 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 History of Cystoscopy Bladder Tumor  Current Meds 1. Aleve TABS;  Therapy: (Recorded:28Oct2014) to Recorded 2. Cialis 5 MG Oral Tablet; TAKE AS DIRECTED;  Therapy: 04Apr2012 to (Last Rx:04Apr2012) Ordered 3. Diazepam 10 MG Oral  Tablet; USE AS DIRECTED;  Therapy: [id number] to (Last Rx:28Apr2015) Ordered 4. Fish Oil CAPS;  Therapy: (Recorded:28Oct2014) to Recorded 5. Lisinopril 5 MG Oral Tablet;  Therapy: 26Oct2013 to Recorded 6. Multi Vitamin/Minerals TABS;  Therapy: (Recorded:28Oct2014) to Recorded  Allergies Medication  1. Penicillins  Family History Problems  1. Family history of Diabetes Mellitus : Maternal Aunt 2. Family history of Prostate Cancer : Brother  Social History Problems  1. Alcohol Use 2. Caffeine Use 3. Former smoker [phone]) 4. Marital History - Currently Married  Review of Systems Genitourinary, constitutional, skin, eye, otolaryngeal, hematologic/lymphatic, cardiovascular, pulmonary, endocrine, musculoskeletal, gastrointestinal, neurological and psychiatric system(s) were reviewed and pertinent findings if present are noted.    Vitals Vital Signs  Weight: 217 lb  Blood Pressure  Blood Pressure: 120 / 78 Pulse  Heart Rate: 82 Respiration  Respiration: 18  Physical Exam Constitutional: Well nourished and well developed. No acute distress.  ENT:. The ears and nose are normal in appearance.  Neck: The appearance of the neck is normal and no neck mass is present.  Pulmonary: No respiratory distress and normal respiratory rhythm and effort.  Cardiovascular: Heart rate and rhythm are normal. No peripheral edema.  Abdomen: The abdomen is soft and nontender. No masses are palpated. No CVA tenderness. No hernias are palpable. No hepatosplenomegaly noted.  Rectal: Rectal exam demonstrates normal sphincter tone, no tenderness and no masses. The prostate has no nodularity and is not tender. The left seminal vesicle is nonpalpable. The right seminal vesicle is nonpalpable. The perineum is normal on inspection.  Genitourinary: Examination  of the penis demonstrates no discharge, no masses, no lesions and a normal meatus. The scrotum is without lesions. The right epididymis is palpably  normal and non-tender. The left epididymis is palpably normal and non-tender. The right testis is non-tender and without masses. The left testis is non-tender and without masses.  Lymphatics: The femoral and inguinal nodes are not enlarged or tender.  Skin: Normal skin turgor, no visible rash and no visible skin lesions.  Neuro/Psych:. Mood and affect are appropriate.       The following images/tracing/specimen were independently visualized:  KUB: Stone in his right kidney appears to increased in size measuring 9 x 10 mm and is now located in the area of the renal pelvis.  The following clinical lab reports were reviewed:  UA:.    Assessment   We discussed the management of urinary stones. These options include observation, ureteroscopy, shockwave lithotripsy, and PCNL. We discussed which options are relevant to these particular stones. We discussed the natural history of stones as well as the complications of untreated stones and the impact on quality of life without treatment as well as with each of the above listed treatments. We also discussed the efficacy of each treatment in its ability to clear the stone burden. With any of these management options I discussed the signs and symptoms of infection and the need for emergent treatment should these be experienced. For each option we discussed the ability of each procedure to clear the patient of their stone burden.    For observation I described the risks which include but are not limited to silent renal damage, life-threatening infection, need for emergent surgery, failure to pass stone, and pain.    For ureteroscopy I described the risks which include heart attack, stroke, pulmonary embolus, death, bleeding, infection, damage to contiguous structures, positioning injury, ureteral stricture, ureteral avulsion, ureteral injury, need for ureteral stent, inability to perform ureteroscopy, need for an interval procedure, inability to clear stone  burden, stent discomfort and pain.    For shockwave lithotripsy I described the risks which include arrhythmia, kidney contusion, kidney hemorrhage, need for transfusion, long-term risk of diabetes or hypertension, back discomfort, flank ecchymosis, flank abrasion, inability to break up stone, inability to pass stone fragments, Steinstrasse, infection associated with obstructing stones, need for different surgical procedure, need for repeat shockwave lithotripsy, and death.    I didn't recommend a percutaneous procedure based on the size of the stone but did discuss ureteroscopy and lithotripsy at length. He has elected proceed with lithotripsy.   Plan  He will be scheduled for lithotripsy of his 10 mm right renal pelvic stone

## 2014-04-06 NOTE — Discharge Instructions (Signed)

## 2014-04-06 NOTE — Progress Notes (Signed)
Patient asked when he could take his alleve and also when he can resume running on treadmill post litho. Dr. Karsten Ro stated he can resume both in 48 hours. Patient informed.

## 2014-08-17 ENCOUNTER — Ambulatory Visit (INDEPENDENT_AMBULATORY_CARE_PROVIDER_SITE_OTHER): Payer: 59 | Admitting: Family Medicine

## 2014-08-17 ENCOUNTER — Encounter: Payer: Self-pay | Admitting: Family Medicine

## 2014-08-17 VITALS — BP 128/72 | HR 60 | Temp 97.9°F | Wt 198.0 lb

## 2014-08-17 DIAGNOSIS — M150 Primary generalized (osteo)arthritis: Secondary | ICD-10-CM

## 2014-08-17 DIAGNOSIS — M79641 Pain in right hand: Secondary | ICD-10-CM

## 2014-08-17 DIAGNOSIS — M159 Polyosteoarthritis, unspecified: Secondary | ICD-10-CM

## 2014-08-17 DIAGNOSIS — M8949 Other hypertrophic osteoarthropathy, multiple sites: Secondary | ICD-10-CM

## 2014-08-17 LAB — RHEUMATOID FACTOR: Rhuematoid fact SerPl-aCnc: <10 IU/mL (ref <=14)

## 2014-08-17 MED ORDER — IBUPROFEN 800 MG PO TABS: 800.0000 mg | ORAL_TABLET | Freq: Three times a day (TID) | ORAL | Status: DC | PRN

## 2014-08-17 NOTE — Progress Notes (Signed)
Pre visit review using our clinic review tool, if applicable. No additional management support is needed unless otherwise documented below in the visit note. 

## 2014-08-17 NOTE — Progress Notes (Signed)
   Subjective:    Patient ID: Joshua [name], male    DOB: [birth year], 59 y.o.   MRN: [id number]  HPI Patient with right hand swelling. He has known osteoarthritis involving multiple joints including the hands but this is more of an acute swelling. He's not had any upper extremity swelling otherwise. No injury. Pain involving all the digits diffusely. Denies any MCP joint pain. He's had some diffuse swelling and possibly some mild warmth. He has taken over-the-counter Motrin with mild relief. He has not been formally diagnosed with psoriasis but recently seen dermatologist and has psoriasis-like plaque in his posterior scalp. No other psoriasis rash. No other family history of inflammatory arthritis. He's had other joint pains including shoulder and knee intermittently  Past Medical History  Diagnosis Date  . BLADDER CANCER 04/05/2009  . HYPERLIPIDEMIA 04/05/2009  . OSTEOARTHRITIS 04/05/2009  . SHOULDER PAIN 01/05/2010  . DIABETES MELLITUS, TYPE II 04/05/2009    pt lost 55 lbs dm has been reversed per the pt  . HYPERTENSION 04/05/2009    per the pt he lost 55 lbs and htn has been reversed  . History of colonic polyp - sessile serrated polyp 03/12/2014   Past Surgical History  Procedure Laterality Date  . Cystoscopy  2009    tumor on bladder  . Tonsillectomy      reports that he has quit smoking. His smoking use included Cigarettes. He has a 5 pack-year smoking history. He has never used smokeless tobacco. He reports that he drinks about 1.8 oz of alcohol per week. He reports that he does not use illicit drugs. family history is not on file. Allergies  Allergen Reactions  . Penicillins     REACTION: hives, fever blisters      Review of Systems  Constitutional: Negative for fever, chills and unexpected weight change.  Respiratory: Negative for cough.   Musculoskeletal: Positive for arthralgias.  Skin: Positive for rash.  Hematological: Negative for adenopathy.       Objective:   Physical Exam  Constitutional: He appears well-developed and well-nourished.  Cardiovascular: Normal rate and regular rhythm.   Pulmonary/Chest: Effort normal and breath sounds normal. No respiratory distress. He has no wheezes. He has no rales.  Musculoskeletal:  He has mild edema involving the right hand compared to the left. Minimal warmth involving all digits. No erythema. Full grip strengths bilaterally.  Skin: Rash noted.  No nail changes such as pitting nails  The midline occipital scalp he has area of rash about 2 x 3 cm which is erythematous well demarcated with thick silvery scale          Assessment & Plan:  Hand pain. He has some mild edema and slight warmth suggesting possible inflammatory changes. Doubt rheumatoid arthritis-given asymmetry and diffuse digit involvement and sparing of MCP joint. He has some known underlying osteoarthritis but this seems to be more acute. Rule out psoriatic arthritis. Obtain labs with CBC, sedimentation rate, rheumatoid factor, anti-CCP antibodies, ANA, HLA-B27 antigen. Motrin 800 mg every 8 hours as needed with food with caution for possible GI symptoms. Consider rheumatology referral based on labs above and response to Motrin

## 2014-08-18 LAB — CBC WITH DIFFERENTIAL/PLATELET
BASOS ABS: 0 10*3/uL (ref 0.0–0.1)
Basophils Relative: 0.5 % (ref 0.0–3.0)
Eosinophils Absolute: 0.2 10*3/uL (ref 0.0–0.7)
Eosinophils Relative: 2.3 % (ref 0.0–5.0)
HCT: 42.9 % (ref 39.0–52.0)
Hemoglobin: 14.7 g/dL (ref 13.0–17.0)
LYMPHS ABS: 3.1 10*3/uL (ref 0.7–4.0)
Lymphocytes Relative: 38.6 % (ref 12.0–46.0)
MCHC: 34.2 g/dL (ref 30.0–36.0)
MCV: 90 fl (ref 78.0–100.0)
Monocytes Absolute: 0.6 10*3/uL (ref 0.1–1.0)
Monocytes Relative: 7.4 % (ref 3.0–12.0)
NEUTROS PCT: 51.2 % (ref 43.0–77.0)
Neutro Abs: 4.1 10*3/uL (ref 1.4–7.7)
Platelets: 288 10*3/uL (ref 150.0–400.0)
RBC: 4.77 Mil/uL (ref 4.22–5.81)
RDW: 13.8 % (ref 11.5–15.5)
WBC: 7.9 10*3/uL (ref 4.0–10.5)

## 2014-08-18 LAB — ANA: ANA: NEGATIVE

## 2014-08-18 LAB — CYCLIC CITRUL PEPTIDE ANTIBODY, IGG

## 2014-08-18 LAB — SEDIMENTATION RATE: Sed Rate: 31 mm/hr — ABNORMAL HIGH (ref 0–22)

## 2014-08-19 LAB — HLA-B27 ANTIGEN: DNA Result:: NOT DETECTED

## 2014-08-21 ENCOUNTER — Other Ambulatory Visit: Payer: Self-pay | Admitting: Family Medicine

## 2014-08-21 DIAGNOSIS — M79641 Pain in right hand: Secondary | ICD-10-CM

## 2016-01-31 ENCOUNTER — Other Ambulatory Visit (INDEPENDENT_AMBULATORY_CARE_PROVIDER_SITE_OTHER): Payer: 59

## 2016-01-31 DIAGNOSIS — Z0000 Encounter for general adult medical examination without abnormal findings: Secondary | ICD-10-CM | POA: Diagnosis not present

## 2016-01-31 DIAGNOSIS — R7989 Other specified abnormal findings of blood chemistry: Secondary | ICD-10-CM | POA: Diagnosis not present

## 2016-01-31 LAB — CBC WITH DIFFERENTIAL/PLATELET
BASOS ABS: 0 10*3/uL (ref 0.0–0.1)
Basophils Relative: 0.6 % (ref 0.0–3.0)
Eosinophils Absolute: 0.1 10*3/uL (ref 0.0–0.7)
Eosinophils Relative: 1.7 % (ref 0.0–5.0)
HEMATOCRIT: 47 % (ref 39.0–52.0)
Hemoglobin: 16.2 g/dL (ref 13.0–17.0)
LYMPHS PCT: 30.1 % (ref 12.0–46.0)
Lymphs Abs: 2.4 10*3/uL (ref 0.7–4.0)
MCHC: 34.5 g/dL (ref 30.0–36.0)
MCV: 93.8 fl (ref 78.0–100.0)
MONOS PCT: 5.2 % (ref 3.0–12.0)
Monocytes Absolute: 0.4 10*3/uL (ref 0.1–1.0)
NEUTROS PCT: 62.4 % (ref 43.0–77.0)
Neutro Abs: 5 10*3/uL (ref 1.4–7.7)
Platelets: 231 10*3/uL (ref 150.0–400.0)
RBC: 5 Mil/uL (ref 4.22–5.81)
RDW: 12.9 % (ref 11.5–15.5)
WBC: 8 10*3/uL (ref 4.0–10.5)

## 2016-01-31 LAB — BASIC METABOLIC PANEL
BUN: 17 mg/dL (ref 6–23)
CALCIUM: 9.3 mg/dL (ref 8.4–10.5)
CO2: 27 mEq/L (ref 19–32)
Chloride: 104 mEq/L (ref 96–112)
Creatinine, Ser: 0.97 mg/dL (ref 0.40–1.50)
GFR: 83.81 mL/min (ref >60.00)
Glucose, Bld: 169 mg/dL — ABNORMAL HIGH (ref 70–99)
Potassium: 5 mEq/L (ref 3.5–5.1)
SODIUM: 140 meq/L (ref 135–145)

## 2016-01-31 LAB — LIPID PANEL
Cholesterol: 217 mg/dL — ABNORMAL HIGH (ref 0–200)
HDL: 47.1 mg/dL (ref >39.00)
NONHDL: 169.55
Total CHOL/HDL Ratio: 5
Triglycerides: 206 mg/dL — ABNORMAL HIGH (ref 0.0–149.0)
VLDL: 41.2 mg/dL — ABNORMAL HIGH (ref 0.0–40.0)

## 2016-01-31 LAB — HEPATIC FUNCTION PANEL
ALBUMIN: 4.2 g/dL (ref 3.5–5.2)
ALK PHOS: 76 U/L (ref 39–117)
ALT: 34 U/L (ref 0–53)
AST: 22 U/L (ref 0–37)
Bilirubin, Direct: 0.1 mg/dL (ref 0.0–0.3)
TOTAL PROTEIN: 7.2 g/dL (ref 6.0–8.3)
Total Bilirubin: 0.6 mg/dL (ref 0.2–1.2)

## 2016-01-31 LAB — TSH: TSH: 1.65 u[IU]/mL (ref 0.35–4.50)

## 2016-01-31 LAB — PSA: PSA: 1.29 ng/mL (ref 0.10–4.00)

## 2016-01-31 LAB — LDL CHOLESTEROL, DIRECT: Direct LDL: 141 mg/dL

## 2016-02-21 ENCOUNTER — Encounter: Payer: Self-pay | Admitting: Family Medicine

## 2016-02-21 ENCOUNTER — Ambulatory Visit (INDEPENDENT_AMBULATORY_CARE_PROVIDER_SITE_OTHER): Payer: 59 | Admitting: Family Medicine

## 2016-02-21 VITALS — BP 140/82 | HR 83 | Temp 98.2°F | Ht 68.0 in | Wt 212.1 lb

## 2016-02-21 DIAGNOSIS — Z0000 Encounter for general adult medical examination without abnormal findings: Secondary | ICD-10-CM

## 2016-02-21 DIAGNOSIS — E119 Type 2 diabetes mellitus without complications: Secondary | ICD-10-CM | POA: Diagnosis not present

## 2016-02-21 DIAGNOSIS — L4050 Arthropathic psoriasis, unspecified: Secondary | ICD-10-CM | POA: Insufficient documentation

## 2016-02-21 LAB — POCT GLYCOSYLATED HEMOGLOBIN (HGB A1C): Hemoglobin A1C: 6.9

## 2016-02-21 MED ORDER — GLUCOSE BLOOD VI STRP: ORAL_STRIP | 5 refills | Status: DC

## 2016-02-21 MED ORDER — BAYER MICROLET LANCETS MISC: 5 refills | Status: AC

## 2016-02-21 NOTE — Progress Notes (Signed)
Pre visit review using our clinic review tool, if applicable. No additional management support is needed unless otherwise documented below in the visit note. 

## 2016-02-21 NOTE — Patient Instructions (Signed)

## 2016-02-21 NOTE — Progress Notes (Signed)
Subjective:     Patient ID: Joshua [name], male   DOB: 1956/05/03, 60 y.o.   MRN: [id number]  HPI Patient here for physical exam. He has history of psoriatic arthritis followed by rheumatology. Past history of bladder cancer followed yearly by urology. He is currently taking Imuran and that seems to be controlling his arthritis very well. He's had past history of type 2 diabetes and was basically able to come off metformin and control this for some time with weight loss. He has been more limited in exercise during the past year and has gained back about 15 pounds. Not currently monitoring blood sugars. He has not had flu vaccine or shingles vaccine-and declines both. Colonoscopy is up-to-date  Past Medical History:  Diagnosis Date  . BLADDER CANCER 04/05/2009  . DIABETES MELLITUS, TYPE II 04/05/2009   pt lost 55 lbs dm has been reversed per the pt  . History of colonic polyp - sessile serrated polyp 03/12/2014  . HYPERLIPIDEMIA 04/05/2009  . HYPERTENSION 04/05/2009   per the pt he lost 55 lbs and htn has been reversed  . OSTEOARTHRITIS 04/05/2009  . SHOULDER PAIN 01/05/2010   Past Surgical History:  Procedure Laterality Date  . CYSTOSCOPY  2009   tumor on bladder  . TONSILLECTOMY      reports that he has quit smoking. His smoking use included Cigarettes. He has a 5.00 pack-year smoking history. He has never used smokeless tobacco. He reports that he drinks about 1.8 oz of alcohol per week . He reports that he does not use drugs. family history includes Arthritis in his father; Diabetes in his mother; Hypertension in his mother. Allergies  Allergen Reactions  . Penicillins     REACTION: hives, fever blisters     Review of Systems  Constitutional: Negative for activity change, appetite change, fatigue and fever.  HENT: Negative for congestion, ear pain and trouble swallowing.   Eyes: Negative for pain and visual disturbance.  Respiratory: Negative for cough, shortness of breath and  wheezing.   Cardiovascular: Negative for chest pain and palpitations.  Gastrointestinal: Negative for abdominal distention, abdominal pain, blood in stool, constipation, diarrhea, nausea, rectal pain and vomiting.  Endocrine: Negative for polydipsia and polyuria.  Genitourinary: Negative for dysuria, hematuria and testicular pain.  Musculoskeletal: Negative for arthralgias and joint swelling.  Skin: Negative for rash.  Neurological: Negative for dizziness, syncope and headaches.  Hematological: Negative for adenopathy.  Psychiatric/Behavioral: Negative for confusion and dysphoric mood.       Objective:   Physical Exam  Constitutional: He is oriented to person, place, and time. He appears well-developed and well-nourished. No distress.  HENT:  Head: Normocephalic and atraumatic.  Right Ear: External ear normal.  Left Ear: External ear normal.  Mouth/Throat: Oropharynx is clear and moist.  Eyes: Conjunctivae and EOM are normal. Pupils are equal, round, and reactive to light.  Neck: Normal range of motion. Neck supple. No thyromegaly present.  Cardiovascular: Normal rate, regular rhythm and normal heart sounds.   No murmur heard. Pulmonary/Chest: No respiratory distress. He has no wheezes. He has no rales.  Abdominal: Soft. Bowel sounds are normal. He exhibits no distension and no mass. There is no tenderness. There is no rebound and no guarding.  Musculoskeletal: He exhibits no edema.  Lymphadenopathy:    He has no cervical adenopathy.  Neurological: He is alert and oriented to person, place, and time. He displays normal reflexes. No cranial nerve deficit.  Skin: No rash noted.  Psychiatric: He has  a normal mood and affect.       Assessment:     Physical exam. Labs reviewed and has fasting glucose 169 with dyslipidemia.    Plan:     -Check hemoglobin A1c (6.9%) -Advised weight loss and more consistent exercise -Low saturated fat diet and consider follow-up for repeat lipids  and A1C in 6 months -Recommend flu vaccine and shingles vaccine and he declines both  [name] MD [facility]

## 2016-02-22 ENCOUNTER — Telehealth: Payer: Self-pay

## 2016-02-22 NOTE — Telephone Encounter (Signed)
Received PA request from [facility] for Contour next test strips. PA submitted & is pending. Key: [id number]

## 2016-02-24 MED ORDER — GLUCOSE BLOOD VI STRP: ORAL_STRIP | 2 refills | Status: DC

## 2016-02-24 NOTE — Telephone Encounter (Signed)
PA was denied. Patient has to try one touch ultra or one touch verio unless patient is currently utilizing a MiniMed Insulin Pump, or if the patient has a documented physical or mental limitation that makes utilization of one of the following [employer] diabetic meter/test strip products unsafe, inaccurate or otherwise not feasible.

## 2016-02-24 NOTE — Telephone Encounter (Signed)
Alternate strips sent in for patient.

## 2016-02-28 ENCOUNTER — Telehealth: Payer: Self-pay | Admitting: Family Medicine

## 2016-02-28 MED ORDER — ONETOUCH ULTRA SYSTEM W/DEVICE KIT: 1.0000 | PACK | Freq: Once | 0 refills | Status: AC

## 2016-02-28 NOTE — Telephone Encounter (Signed)
Device sent to pharmacy. 

## 2016-02-28 NOTE — Telephone Encounter (Signed)
Pt needs new rx  one touch ultra glucometer send to [facility]. Pt insurance prefer this meter

## 2016-03-17 LAB — HM DIABETES EYE EXAM

## 2016-03-22 ENCOUNTER — Encounter: Payer: Self-pay | Admitting: Family Medicine

## 2017-01-05 ENCOUNTER — Ambulatory Visit (INDEPENDENT_AMBULATORY_CARE_PROVIDER_SITE_OTHER): Payer: 59 | Admitting: Family Medicine

## 2017-01-05 ENCOUNTER — Encounter: Payer: Self-pay | Admitting: Family Medicine

## 2017-01-05 VITALS — BP 130/80 | HR 73 | Temp 99.4°F | Wt 217.6 lb

## 2017-01-05 DIAGNOSIS — R531 Weakness: Secondary | ICD-10-CM | POA: Diagnosis not present

## 2017-01-05 DIAGNOSIS — E785 Hyperlipidemia, unspecified: Secondary | ICD-10-CM | POA: Diagnosis not present

## 2017-01-05 DIAGNOSIS — E119 Type 2 diabetes mellitus without complications: Secondary | ICD-10-CM

## 2017-01-05 LAB — LIPID PANEL
CHOL/HDL RATIO: 4
Cholesterol: 178 mg/dL (ref 0–200)
HDL: 45.9 mg/dL (ref >39.00)
LDL Cholesterol: 100 mg/dL — ABNORMAL HIGH (ref 0–99)
NonHDL: 132.52
TRIGLYCERIDES: 165 mg/dL — AB (ref 0.0–149.0)
VLDL: 33 mg/dL (ref 0.0–40.0)

## 2017-01-05 LAB — CBC WITH DIFFERENTIAL/PLATELET
BASOS PCT: 1.8 % (ref 0.0–3.0)
Basophils Absolute: 0.1 10*3/uL (ref 0.0–0.1)
EOS ABS: 0 10*3/uL (ref 0.0–0.7)
Eosinophils Relative: 0.5 % (ref 0.0–5.0)
HCT: 47.2 % (ref 39.0–52.0)
Hemoglobin: 15.9 g/dL (ref 13.0–17.0)
Lymphocytes Relative: 19.8 % (ref 12.0–46.0)
Lymphs Abs: 1.6 10*3/uL (ref 0.7–4.0)
MCHC: 33.8 g/dL (ref 30.0–36.0)
MCV: 97.2 fl (ref 78.0–100.0)
MONO ABS: 0.6 10*3/uL (ref 0.1–1.0)
Monocytes Relative: 8 % (ref 3.0–12.0)
Neutro Abs: 5.6 10*3/uL (ref 1.4–7.7)
Neutrophils Relative %: 69.9 % (ref 43.0–77.0)
PLATELETS: 220 10*3/uL (ref 150.0–400.0)
RBC: 4.85 Mil/uL (ref 4.22–5.81)
RDW: 12.7 % (ref 11.5–15.5)
WBC: 7.9 10*3/uL (ref 4.0–10.5)

## 2017-01-05 LAB — HEMOGLOBIN A1C: Hgb A1c MFr Bld: 8.1 % — ABNORMAL HIGH (ref 4.6–6.5)

## 2017-01-05 LAB — COMPREHENSIVE METABOLIC PANEL
ALT: 54 U/L — ABNORMAL HIGH (ref 0–53)
AST: 37 U/L (ref 0–37)
Albumin: 4.2 g/dL (ref 3.5–5.2)
Alkaline Phosphatase: 83 U/L (ref 39–117)
BUN: 16 mg/dL (ref 6–23)
CHLORIDE: 102 meq/L (ref 96–112)
CO2: 30 meq/L (ref 19–32)
Calcium: 9.6 mg/dL (ref 8.4–10.5)
Creatinine, Ser: 1.14 mg/dL (ref 0.40–1.50)
GFR: 69.34 mL/min (ref >60.00)
GLUCOSE: 257 mg/dL — AB (ref 70–99)
Potassium: 4.8 mEq/L (ref 3.5–5.1)
SODIUM: 137 meq/L (ref 135–145)
Total Bilirubin: 0.8 mg/dL (ref 0.2–1.2)
Total Protein: 7.2 g/dL (ref 6.0–8.3)

## 2017-01-05 NOTE — Patient Instructions (Signed)

## 2017-01-05 NOTE — Progress Notes (Signed)
Subjective:     Patient ID: Joshua [name], male   DOB: [birth year], 61 y.o.   MRN: [id number]  HPI Patient is seen with nonspecific symptoms of fatigue and weakness and some myalgias. He states this past Monday he woke up with some nausea without vomiting and diarrhea without 8 or 10 watery nonbloody stools. His diarrhea had resolved by next day. His nausea has resolved. He has persistent fatigue and weakness. He was playing golf last Saturday and had on his right lower ankle what he described as a papular lesion with a white center. He felt something had bitten him as he noticed sharp pain when he was walking through some high grass. He did not see tick, spider, or any other insects.. He reached down and scratched the area and had some bleeding afterwards. He was concerned whether he may have had a tick bite but never saw a tick.  He's had some night sweats occasionally and increased malaise. No fever. No rash. Only mild headache yesterday but none since then. Appetite improving.  He has psoriatic arthritis and is on Enbrel.  Past Medical History:  Diagnosis Date  . BLADDER CANCER 04/05/2009  . DIABETES MELLITUS, TYPE II 04/05/2009   pt lost 55 lbs dm has been reversed per the pt  . History of colonic polyp - sessile serrated polyp 03/12/2014  . HYPERLIPIDEMIA 04/05/2009  . HYPERTENSION 04/05/2009   per the pt he lost 55 lbs and htn has been reversed  . OSTEOARTHRITIS 04/05/2009  . SHOULDER PAIN 01/05/2010   Past Surgical History:  Procedure Laterality Date  . CYSTOSCOPY  2009   tumor on bladder  . TONSILLECTOMY      reports that he has quit smoking. His smoking use included Cigarettes. He has a 5.00 pack-year smoking history. He has never used smokeless tobacco. He reports that he drinks about 1.8 oz of alcohol per week . He reports that he does not use drugs. family history includes Arthritis in his father; Diabetes in his mother; Hypertension in his mother. Allergies  Allergen Reactions  .  Penicillins     REACTION: hives, fever blisters     Review of Systems  Constitutional: Positive for fatigue. Negative for fever.  HENT: Negative for sore throat.   Respiratory: Negative for cough and shortness of breath.   Skin: Negative for rash.  Neurological: Positive for weakness. Negative for headaches.  Hematological: Negative for adenopathy.       Objective:   Physical Exam  Constitutional: He appears well-developed and well-nourished.  HENT:  Right Ear: External ear normal.  Left Ear: External ear normal.  Mouth/Throat: Oropharynx is clear and moist.  Neck: Neck supple.  Cardiovascular: Normal rate and regular rhythm.   Pulmonary/Chest: Effort normal and breath sounds normal. No respiratory distress. He has no wheezes. He has no rales.  Lymphadenopathy:    He has no cervical adenopathy.  Skin: No rash noted.  Patient has a very small eschar just above his right ankle. No pustules. No skin rash. No visible retained tick parts       Assessment:     #1 Patient presents with nonspecific symptoms of weakness and fatigue following 1 day of diarrhea earlier in the week. He had question of recent tick bite but this was not observed. He does not have any other features to suggest tick related illness such as headache, fever, rash, etc.  #2 Type 2 diabetes.  Overdue for follow up labs/ A1C.    Plan:     -  Check labs with comprehensive metabolic panel and CBC -Follow-up immediately for any fever, headache, skin rash -A1C and lipids.  [name] MD [facility] at [facility]

## 2017-01-09 ENCOUNTER — Other Ambulatory Visit: Payer: Self-pay | Admitting: Family Medicine

## 2017-01-09 MED ORDER — METFORMIN HCL 500 MG PO TABS: 500.0000 mg | ORAL_TABLET | Freq: Two times a day (BID) | ORAL | 3 refills | Status: DC

## 2017-01-11 ENCOUNTER — Other Ambulatory Visit: Payer: Self-pay | Admitting: Adult Health

## 2017-01-11 ENCOUNTER — Ambulatory Visit (INDEPENDENT_AMBULATORY_CARE_PROVIDER_SITE_OTHER)
Admission: RE | Admit: 2017-01-11 | Discharge: 2017-01-11 | Disposition: A | Discharge status: Home / Self Care | Payer: 59 | Source: Ambulatory Visit | Attending: Adult Health | Admitting: Adult Health

## 2017-01-11 ENCOUNTER — Encounter: Payer: Self-pay | Admitting: Adult Health

## 2017-01-11 ENCOUNTER — Ambulatory Visit (INDEPENDENT_AMBULATORY_CARE_PROVIDER_SITE_OTHER): Payer: 59 | Admitting: Adult Health

## 2017-01-11 VITALS — BP 124/68 | Temp 97.9°F | Wt 213.0 lb

## 2017-01-11 DIAGNOSIS — R6889 Other general symptoms and signs: Secondary | ICD-10-CM | POA: Diagnosis not present

## 2017-01-11 DIAGNOSIS — R61 Generalized hyperhidrosis: Secondary | ICD-10-CM

## 2017-01-11 DIAGNOSIS — R059 Cough, unspecified: Secondary | ICD-10-CM

## 2017-01-11 DIAGNOSIS — R05 Cough: Secondary | ICD-10-CM

## 2017-01-11 DIAGNOSIS — J189 Pneumonia, unspecified organism: Secondary | ICD-10-CM

## 2017-01-11 DIAGNOSIS — J181 Lobar pneumonia, unspecified organism: Principal | ICD-10-CM

## 2017-01-11 LAB — CBC WITH DIFFERENTIAL/PLATELET
BASOS PCT: 1.1 % (ref 0.0–3.0)
Basophils Absolute: 0.1 10*3/uL (ref 0.0–0.1)
EOS ABS: 0.1 10*3/uL (ref 0.0–0.7)
Eosinophils Relative: 1.6 % (ref 0.0–5.0)
HEMATOCRIT: 45.6 % (ref 39.0–52.0)
HEMOGLOBIN: 15.5 g/dL (ref 13.0–17.0)
LYMPHS PCT: 41.3 % (ref 12.0–46.0)
Lymphs Abs: 2.1 10*3/uL (ref 0.7–4.0)
MCHC: 34.1 g/dL (ref 30.0–36.0)
MCV: 95.8 fl (ref 78.0–100.0)
Monocytes Absolute: 0.6 10*3/uL (ref 0.1–1.0)
Monocytes Relative: 12.7 % — ABNORMAL HIGH (ref 3.0–12.0)
Neutro Abs: 2.2 10*3/uL (ref 1.4–7.7)
Neutrophils Relative %: 43.3 % (ref 43.0–77.0)
Platelets: 292 10*3/uL (ref 150.0–400.0)
RBC: 4.76 Mil/uL (ref 4.22–5.81)
RDW: 12.2 % (ref 11.5–15.5)
WBC: 5 10*3/uL (ref 4.0–10.5)

## 2017-01-11 MED ORDER — LEVOFLOXACIN 750 MG PO TABS: 750.0000 mg | ORAL_TABLET | Freq: Every day | ORAL | 0 refills | Status: DC

## 2017-01-11 NOTE — Progress Notes (Signed)
Subjective:    Patient ID: Joshua [name], male    DOB: [birth year], 61 y.o.   MRN: [id number]  HPI  61 year old male who  has a past medical history of BLADDER CANCER (04/05/2009); DIABETES MELLITUS, TYPE II (04/05/2009); History of colonic polyp - sessile serrated polyp (03/12/2014); HYPERLIPIDEMIA (04/05/2009); HYPERTENSION (04/05/2009); OSTEOARTHRITIS (04/05/2009); and SHOULDER PAIN (01/05/2010).  He is a patient of Dr. Elease Hashimoto who I am seeing today. He was seen by his PCP 6 days ago for symptoms of fatigue and weakness with some myalgias. Prior to seeing Dr. Elease Hashimoto on this day he had episodes of nausea without vomiting and diarrhea that had resolved prior. The patient at this time felt that something had bitten him on the right lower ankle while he was playing golf.   Per PCP note: Patient has a very small eschar just above his right ankle. No pustules. No skin rash. No visible retained tick parts   Today he reports that over the weekend he had a fever and woke up in the middle of the night " in a pool of sweat" He he felt as though he had a fever   The next day he woke up feeling fine until that night when he had a low grade fever up to 99 degrees.   Over the last two night he reports waking up with cold sweats but no fever.   Over the last 24 hours he has develop a dry cough.   Over all he feels as though " I am about 80% better than I was when I saw Dr. Elease Hashimoto "   He denies any rashes, joint pain or feeling acutely ill.   Review of Systems See HPI   Past Medical History:  Diagnosis Date  . BLADDER CANCER 04/05/2009  . DIABETES MELLITUS, TYPE II 04/05/2009   pt lost 55 lbs dm has been reversed per the pt  . History of colonic polyp - sessile serrated polyp 03/12/2014  . HYPERLIPIDEMIA 04/05/2009  . HYPERTENSION 04/05/2009   per the pt he lost 55 lbs and htn has been reversed  . OSTEOARTHRITIS 04/05/2009  . SHOULDER PAIN 01/05/2010    Social History   Social History  . Marital  status: Married    Spouse name: N/A  . Number of children: N/A  . Years of education: N/A   Occupational History  . Not on file.   Social History Main Topics  . Smoking status: Former Smoker    Packs/day: 1.00    Years: 5.00    Types: Cigarettes  . Smokeless tobacco: Never Used  . Alcohol use 1.8 oz/week    3 Cans of beer per week  . Drug use: No  . Sexual activity: Not on file   Other Topics Concern  . Not on file   Social History Narrative  . No narrative on file    Past Surgical History:  Procedure Laterality Date  . CYSTOSCOPY  2009   tumor on bladder  . TONSILLECTOMY      Family History  Problem Relation Age of Onset  . Diabetes Mother   . Hypertension Mother   . Arthritis Father     Allergies  Allergen Reactions  . Penicillins     REACTION: hives, fever blisters    Current Outpatient Prescriptions on File Prior to Visit  Medication Sig Dispense Refill  . BAYER MICROLET LANCETS lancets Check Blood Sugar Once Daily. 30 each 5  . ENBREL SURECLICK 50 MG/ML injection     .  glucose blood test strip Check blood sugars once per day. 100 each 2  . ibuprofen (ADVIL,MOTRIN) 800 MG tablet Take 1 tablet (800 mg total) by mouth every 8 (eight) hours as needed. 60 tablet 1  . naproxen sodium (ANAPROX) 220 MG tablet Take 220 mg by mouth 2 (two) times daily with a meal.      . metFORMIN (GLUCOPHAGE) 500 MG tablet Take 1 tablet (500 mg total) by mouth 2 (two) times daily with a meal. (Patient not taking: Reported on 01/11/2017) 180 tablet 3   No current facility-administered medications on file prior to visit.     BP 124/68 (BP Location: Left Arm)   Temp 97.9 F (36.6 C) (Oral)   Wt 213 lb (96.6 kg)   BMI 32.39 kg/m       Objective:   Physical Exam  Constitutional: He is oriented to person, place, and time. He appears well-developed and well-nourished. No distress.  Neck: Normal range of motion. Neck supple.  Cardiovascular: Normal rate, regular rhythm, normal  heart sounds and intact distal pulses.  Exam reveals no gallop and no friction rub.   No murmur heard. Pulmonary/Chest: Effort normal and breath sounds normal. No respiratory distress. He has no wheezes. He has no rales. He exhibits no tenderness.  Abdominal: Soft. Bowel sounds are normal. He exhibits no distension and no mass. There is no tenderness. There is no rebound and no guarding.  Lymphadenopathy:    He has no cervical adenopathy.  Neurological: He is alert and oriented to person, place, and time.  Skin: Skin is warm and dry. No rash noted. He is not diaphoretic. No erythema. No pallor.  Psychiatric: He has a normal mood and affect. His behavior is normal. Judgment and thought content normal.  Nursing note and vitals reviewed.     Assessment & Plan:   1. Cough - Unlikely but need to r/o pneumonia due to symptoms of night sweats and fever  - CBC with Differential/Platelet - DG Chest 2 View; Future  2. Night sweats -  Highly unlikely for tick borne illness but will check for lyme. It appears as though this is a viral illness.  - B. [place]dorfi Antibodies  - CBC with Differential/Platelet - Will update patient with labs and plan   Dorothyann Peng, NP

## 2017-01-12 LAB — B. BURGDORFI ANTIBODIES

## 2017-02-07 ENCOUNTER — Ambulatory Visit (INDEPENDENT_AMBULATORY_CARE_PROVIDER_SITE_OTHER)
Admission: RE | Admit: 2017-02-07 | Discharge: 2017-02-07 | Disposition: A | Discharge status: Home / Self Care | Payer: 59 | Source: Ambulatory Visit | Attending: Adult Health | Admitting: Adult Health

## 2017-02-07 DIAGNOSIS — J189 Pneumonia, unspecified organism: Secondary | ICD-10-CM

## 2017-02-07 DIAGNOSIS — J181 Lobar pneumonia, unspecified organism: Secondary | ICD-10-CM

## 2017-05-11 ENCOUNTER — Telehealth: Payer: Self-pay | Admitting: Family Medicine

## 2017-05-11 NOTE — Telephone Encounter (Signed)
Patient is aware and an appointment made 

## 2017-05-11 NOTE — Telephone Encounter (Signed)
Copied from [place] [id number]. Topic: Quick Communication - See Telephone Encounter >> May 11, 2017  9:04 AM Antonieta Iba C wrote:    CRM for notification. See Telephone encounter for: Pt's spouse [name] called in to be advised. She said that pt is currently taking Metformin but it causes that pt to have a stomach ache, diarrhea and constipation. She would like to know if pt could take something else?   Please advise.   CB: [phone]   05/11/17.

## 2017-05-11 NOTE — Telephone Encounter (Signed)
Set up follow-up to discuss other potential options

## 2017-05-18 ENCOUNTER — Ambulatory Visit (INDEPENDENT_AMBULATORY_CARE_PROVIDER_SITE_OTHER): Payer: 59 | Admitting: Family Medicine

## 2017-05-18 ENCOUNTER — Encounter: Payer: Self-pay | Admitting: Family Medicine

## 2017-05-18 VITALS — BP 110/80 | HR 78 | Temp 98.5°F | Wt 217.6 lb

## 2017-05-18 DIAGNOSIS — E119 Type 2 diabetes mellitus without complications: Secondary | ICD-10-CM

## 2017-05-18 LAB — POCT GLYCOSYLATED HEMOGLOBIN (HGB A1C): HEMOGLOBIN A1C: 8.3

## 2017-05-18 MED ORDER — METFORMIN HCL ER 500 MG PO TB24: 500.0000 mg | ORAL_TABLET | Freq: Every day | ORAL | 6 refills | Status: DC

## 2017-05-18 NOTE — Progress Notes (Signed)
Subjective:     Patient ID: Joshua [name], male   DOB: 1955/10/21, 61 y.o.   MRN: [id number]  HPI Patient for follow-up type 2 diabetes.  Back in August his A1c was 8.1%. We started back metformin but he was unable to tolerate even 500 mg once daily secondary to abdominal cramps and occasional diarrhea. Has been off now for several months. He took early retirement in October. Exercising several days per week. Weight unchanged. Overdue for eye exam.  Past Medical History:  Diagnosis Date  . BLADDER CANCER 04/05/2009  . DIABETES MELLITUS, TYPE II 04/05/2009   pt lost 55 lbs dm has been reversed per the pt  . History of colonic polyp - sessile serrated polyp 03/12/2014  . HYPERLIPIDEMIA 04/05/2009  . HYPERTENSION 04/05/2009   per the pt he lost 55 lbs and htn has been reversed  . OSTEOARTHRITIS 04/05/2009  . SHOULDER PAIN 01/05/2010   Past Surgical History:  Procedure Laterality Date  . CYSTOSCOPY  2009   tumor on bladder  . TONSILLECTOMY      reports that he has quit smoking. His smoking use included cigarettes. He has a 5.00 pack-year smoking history. he has never used smokeless tobacco. He reports that he drinks about 1.8 oz of alcohol per week. He reports that he does not use drugs. family history includes Arthritis in his father; Diabetes in his mother; Hypertension in his mother. Allergies  Allergen Reactions  . Penicillins     REACTION: hives, fever blisters     Review of Systems  Constitutional: Negative for fatigue.  Eyes: Negative for visual disturbance.  Respiratory: Negative for cough, chest tightness and shortness of breath.   Cardiovascular: Negative for chest pain, palpitations and leg swelling.  Endocrine: Negative for polydipsia and polyuria.  Musculoskeletal: Positive for arthralgias.  Neurological: Negative for dizziness, syncope, weakness, light-headedness and headaches.       Objective:   Physical Exam  Constitutional: He is oriented to person, place, and time.  He appears well-developed and well-nourished.  HENT:  Right Ear: External ear normal.  Left Ear: External ear normal.  Mouth/Throat: Oropharynx is clear and moist.  Eyes: Pupils are equal, round, and reactive to light.  Neck: Neck supple. No thyromegaly present.  Cardiovascular: Normal rate and regular rhythm.  Pulmonary/Chest: Effort normal and breath sounds normal. No respiratory distress. He has no wheezes. He has no rales.  Musculoskeletal: He exhibits no edema.  Neurological: He is alert and oriented to person, place, and time.       Assessment:     Type 2 diabetes. Poorly controlled with A1c today 8.3%. Intolerance with immediate release metformin    Plan:     -will try metformin extended release 500 mg once daily. He is encouraged to let us know immediately if he can't tolerate -continue regular exercise habits and try to lose some weight -Plan routine follow-up in 3 months. If not to goal at that point titrate metformin versus additional medication.  [name] MD [facility] at [facility]

## 2017-05-21 ENCOUNTER — Telehealth: Payer: Self-pay | Admitting: Family Medicine

## 2017-05-21 MED ORDER — EMPAGLIFLOZIN 10 MG PO TABS: 10.0000 mg | ORAL_TABLET | Freq: Every day | ORAL | 3 refills | Status: DC

## 2017-05-21 NOTE — Telephone Encounter (Signed)
Copied from [place]. Topic: Quick Communication - See Telephone Encounter >> May 21, 2017  9:11 AM Robina Ade, Helene Kelp D wrote: CRM for notification. See Telephone encounter for: 05/21/17. Patient wife called and said that he was seen on Friday and was given metformin but the medication gave him stomach pain, constipation, gas, diarrhea.  Please call patient back, thanks.

## 2017-05-21 NOTE — Telephone Encounter (Signed)
If he can't take extended release Metformin, I suggest we try to start Jardiance 10 mg po once daily #30 with 3 refills. Make sure we have 3 month follow up.

## 2017-05-21 NOTE — Telephone Encounter (Signed)
Spoke with wife.  New rx sent.  Med list updated.

## 2017-07-31 ENCOUNTER — Encounter: Payer: Self-pay | Admitting: Family Medicine

## 2017-07-31 ENCOUNTER — Ambulatory Visit (INDEPENDENT_AMBULATORY_CARE_PROVIDER_SITE_OTHER): Payer: BLUE CROSS/BLUE SHIELD | Admitting: Family Medicine

## 2017-07-31 VITALS — BP 110/70 | HR 65 | Temp 98.1°F | Wt 214.2 lb

## 2017-07-31 DIAGNOSIS — E119 Type 2 diabetes mellitus without complications: Secondary | ICD-10-CM | POA: Diagnosis not present

## 2017-07-31 LAB — MICROALBUMIN / CREATININE URINE RATIO
Creatinine,U: 96.5 mg/dL
MICROALB/CREAT RATIO: 0.7 mg/g (ref 0.0–30.0)
Microalb, Ur: <0.7 mg/dL (ref 0.0–1.9)

## 2017-07-31 LAB — POCT GLYCOSYLATED HEMOGLOBIN (HGB A1C): Hemoglobin A1C: 7

## 2017-07-31 MED ORDER — EMPAGLIFLOZIN 10 MG PO TABS: 10.0000 mg | ORAL_TABLET | Freq: Every day | ORAL | 3 refills | Status: DC

## 2017-07-31 MED ORDER — GLUCOSE BLOOD VI STRP: ORAL_STRIP | 2 refills | Status: DC

## 2017-07-31 NOTE — Progress Notes (Signed)
Subjective:     Patient ID: Joshua [name], male   DOB: May 27, 1956, 62 y.o.   MRN: [id number]  HPI Follow-up type 2 diabetes. Patient's had previous intolerance with metformin. We started Jardiance and has done very well with no side effects. Last A1c 8.3%. Recent fasting blood sugars around 120-130. No polyuria or polydipsia. Needs urine microalbumin today.  Has been exercising more diligently. He had lipids checked in the fall. These were reviewed. He declines statin use. Has lost 3 pounds since last visit  Past Medical History:  Diagnosis Date  . BLADDER CANCER 04/05/2009  . DIABETES MELLITUS, TYPE II 04/05/2009   pt lost 55 lbs dm has been reversed per the pt  . History of colonic polyp - sessile serrated polyp 03/12/2014  . HYPERLIPIDEMIA 04/05/2009  . HYPERTENSION 04/05/2009   per the pt he lost 55 lbs and htn has been reversed  . OSTEOARTHRITIS 04/05/2009  . SHOULDER PAIN 01/05/2010   Past Surgical History:  Procedure Laterality Date  . CYSTOSCOPY  2009   tumor on bladder  . TONSILLECTOMY      reports that he has quit smoking. His smoking use included cigarettes. He has a 5.00 pack-year smoking history. he has never used smokeless tobacco. He reports that he drinks about 1.8 oz of alcohol per week. He reports that he does not use drugs. family history includes Arthritis in his father; Diabetes in his mother; Hypertension in his mother. Allergies  Allergen Reactions  . Penicillins     REACTION: hives, fever blisters     Review of Systems  Constitutional: Negative for fatigue and unexpected weight change.  Eyes: Negative for visual disturbance.  Respiratory: Negative for cough, chest tightness and shortness of breath.   Cardiovascular: Negative for chest pain, palpitations and leg swelling.  Endocrine: Negative for polydipsia and polyuria.  Neurological: Negative for dizziness, syncope, weakness, light-headedness and headaches.       Objective:   Physical Exam   Constitutional: He is oriented to person, place, and time. He appears well-developed and well-nourished.  HENT:  Right Ear: External ear normal.  Left Ear: External ear normal.  Mouth/Throat: Oropharynx is clear and moist.  Eyes: Pupils are equal, round, and reactive to light.  Neck: Neck supple. No thyromegaly present.  Cardiovascular: Normal rate and regular rhythm.  Pulmonary/Chest: Effort normal and breath sounds normal. No respiratory distress. He has no wheezes. He has no rales.  Musculoskeletal: He exhibits no edema.  Neurological: He is alert and oriented to person, place, and time.       Assessment:     #1 type 2 diabetes improved with A1c today 7.0%  #2 mild dyslipidemia. We discussed recommendations for statin use and he declines    Plan:     -Continue Jardiance with refills given -Refilled glucose test strips -Continue regular exercise habits -Routine follow-up in 6 months  Joshua Post MD [facility] at [facility]

## 2017-08-07 DIAGNOSIS — L409 Psoriasis, unspecified: Secondary | ICD-10-CM | POA: Diagnosis not present

## 2017-08-07 DIAGNOSIS — Z79899 Other long term (current) drug therapy: Secondary | ICD-10-CM | POA: Diagnosis not present

## 2017-08-07 DIAGNOSIS — L4059 Other psoriatic arthropathy: Secondary | ICD-10-CM | POA: Diagnosis not present

## 2017-08-07 DIAGNOSIS — M2550 Pain in unspecified joint: Secondary | ICD-10-CM | POA: Diagnosis not present

## 2017-08-28 DIAGNOSIS — L820 Inflamed seborrheic keratosis: Secondary | ICD-10-CM | POA: Diagnosis not present

## 2017-08-28 DIAGNOSIS — L700 Acne vulgaris: Secondary | ICD-10-CM | POA: Diagnosis not present

## 2017-08-28 DIAGNOSIS — L400 Psoriasis vulgaris: Secondary | ICD-10-CM | POA: Diagnosis not present

## 2017-08-28 DIAGNOSIS — L720 Epidermal cyst: Secondary | ICD-10-CM | POA: Diagnosis not present

## 2018-01-17 DIAGNOSIS — Z125 Encounter for screening for malignant neoplasm of prostate: Secondary | ICD-10-CM | POA: Diagnosis not present

## 2018-01-29 DIAGNOSIS — N5201 Erectile dysfunction due to arterial insufficiency: Secondary | ICD-10-CM | POA: Diagnosis not present

## 2018-01-29 DIAGNOSIS — Z125 Encounter for screening for malignant neoplasm of prostate: Secondary | ICD-10-CM | POA: Diagnosis not present

## 2018-01-29 DIAGNOSIS — Z8551 Personal history of malignant neoplasm of bladder: Secondary | ICD-10-CM | POA: Diagnosis not present

## 2018-02-08 DIAGNOSIS — L409 Psoriasis, unspecified: Secondary | ICD-10-CM | POA: Diagnosis not present

## 2018-02-08 DIAGNOSIS — L4059 Other psoriatic arthropathy: Secondary | ICD-10-CM | POA: Diagnosis not present

## 2018-02-08 DIAGNOSIS — Z79899 Other long term (current) drug therapy: Secondary | ICD-10-CM | POA: Diagnosis not present

## 2018-02-08 DIAGNOSIS — M2550 Pain in unspecified joint: Secondary | ICD-10-CM | POA: Diagnosis not present

## 2018-02-14 DIAGNOSIS — N5201 Erectile dysfunction due to arterial insufficiency: Secondary | ICD-10-CM | POA: Diagnosis not present

## 2018-03-26 DIAGNOSIS — N5201 Erectile dysfunction due to arterial insufficiency: Secondary | ICD-10-CM | POA: Diagnosis not present

## 2018-03-27 DIAGNOSIS — L0291 Cutaneous abscess, unspecified: Secondary | ICD-10-CM | POA: Diagnosis not present

## 2018-03-27 DIAGNOSIS — L080 Pyoderma: Secondary | ICD-10-CM | POA: Diagnosis not present

## 2018-04-04 DIAGNOSIS — N5201 Erectile dysfunction due to arterial insufficiency: Secondary | ICD-10-CM | POA: Diagnosis not present

## 2018-06-01 ENCOUNTER — Other Ambulatory Visit: Payer: Self-pay | Admitting: Family Medicine

## 2018-07-03 ENCOUNTER — Telehealth: Payer: Self-pay | Admitting: *Deleted

## 2018-07-03 NOTE — Telephone Encounter (Signed)
Prior auth for Jardiance 10mg  sent to [email or web address]-key [id number].

## 2018-07-05 NOTE — Telephone Encounter (Signed)
Fax received from [facility] stating the request was approved through 07/04/2019.

## 2018-08-29 ENCOUNTER — Other Ambulatory Visit: Payer: Self-pay | Admitting: Family Medicine

## 2018-08-30 NOTE — Telephone Encounter (Signed)
Patient has a WebEx appointment on Monday, 09/02/18.

## 2018-09-02 ENCOUNTER — Ambulatory Visit (INDEPENDENT_AMBULATORY_CARE_PROVIDER_SITE_OTHER): Payer: BLUE CROSS/BLUE SHIELD | Admitting: Family Medicine

## 2018-09-02 ENCOUNTER — Other Ambulatory Visit: Payer: Self-pay

## 2018-09-02 DIAGNOSIS — E785 Hyperlipidemia, unspecified: Secondary | ICD-10-CM

## 2018-09-02 DIAGNOSIS — E119 Type 2 diabetes mellitus without complications: Secondary | ICD-10-CM

## 2018-09-02 DIAGNOSIS — I10 Essential (primary) hypertension: Secondary | ICD-10-CM | POA: Diagnosis not present

## 2018-09-02 LAB — LIPID PANEL
Cholesterol: 212 mg/dL — ABNORMAL HIGH (ref 0–200)
HDL: 35.9 mg/dL — ABNORMAL LOW (ref >39.00)
NonHDL: 176
Total CHOL/HDL Ratio: 6
Triglycerides: 396 mg/dL — ABNORMAL HIGH (ref 0.0–149.0)
VLDL: 79.2 mg/dL — ABNORMAL HIGH (ref 0.0–40.0)

## 2018-09-02 LAB — BASIC METABOLIC PANEL
BUN: 24 mg/dL — ABNORMAL HIGH (ref 6–23)
CO2: 25 mEq/L (ref 19–32)
Calcium: 9.6 mg/dL (ref 8.4–10.5)
Chloride: 102 mEq/L (ref 96–112)
Creatinine, Ser: 1.03 mg/dL (ref 0.40–1.50)
GFR: 72.95 mL/min (ref >60.00)
Glucose, Bld: 132 mg/dL — ABNORMAL HIGH (ref 70–99)
Potassium: 5 mEq/L (ref 3.5–5.1)
Sodium: 139 mEq/L (ref 135–145)

## 2018-09-02 LAB — HEPATIC FUNCTION PANEL
ALBUMIN: 4.2 g/dL (ref 3.5–5.2)
ALT: 19 U/L (ref 0–53)
AST: 14 U/L (ref 0–37)
Alkaline Phosphatase: 87 U/L (ref 39–117)
Bilirubin, Direct: 0.1 mg/dL (ref 0.0–0.3)
Total Bilirubin: 0.4 mg/dL (ref 0.2–1.2)
Total Protein: 7.3 g/dL (ref 6.0–8.3)

## 2018-09-02 LAB — LDL CHOLESTEROL, DIRECT: Direct LDL: 131 mg/dL

## 2018-09-02 LAB — HEMOGLOBIN A1C: Hgb A1c MFr Bld: 7 % — ABNORMAL HIGH (ref 4.6–6.5)

## 2018-09-02 MED ORDER — GLUCOSE BLOOD VI STRP: ORAL_STRIP | 3 refills | Status: DC

## 2018-09-02 NOTE — Progress Notes (Signed)
Patient ID: Joshua [name], male   DOB: 06-Sep-1955, 63 y.o.   MRN: [id number]  Virtual Visit via Video Note  I connected with Joshua [name] on 09/02/18 at  9:45 AM EDT by a video enabled telemedicine application and verified that I am speaking with the correct person using two identifiers.  Location patient: home Location provider:work or home office Persons participating in the virtual visit: patient, provider  I discussed the limitations of evaluation and management by telemedicine and the availability of in person appointments. The patient expressed understanding and agreed to proceed.   HPI: Patient has history of type 2 diabetes, hypertension, hyperlipidemia, psoriatic arthritis, and remote history of bladder cancer.  He is followed regularly by rheumatology regarding his psoriatic arthritis.  Patient had last A1c of 7.0%.  Since last visit he has been very diligent with reducing carbs and has seen some improvement in blood sugars with his weight loss.  He has had some recent average fasting blood sugar around 105 which is an improvement from before.  He remains on Jardiance.  He is requesting refill of test strips today.  He has not had any recent lipids.  Does have history of high triglycerides in the past.  He would like to get further labs today.  Denies any recent chest pains or dizziness.  Still playing golf regularly.   ROS: See pertinent positives and negatives per HPI.  Past Medical History:  Diagnosis Date  . BLADDER CANCER 04/05/2009  . DIABETES MELLITUS, TYPE II 04/05/2009   pt lost 55 lbs dm has been reversed per the pt  . History of colonic polyp - sessile serrated polyp 03/12/2014  . HYPERLIPIDEMIA 04/05/2009  . HYPERTENSION 04/05/2009   per the pt he lost 55 lbs and htn has been reversed  . OSTEOARTHRITIS 04/05/2009  . SHOULDER PAIN 01/05/2010    Past Surgical History:  Procedure Laterality Date  . CYSTOSCOPY  2009   tumor on bladder  . TONSILLECTOMY       Family History  Problem Relation Age of Onset  . Diabetes Mother   . Hypertension Mother   . Arthritis Father     SOCIAL HX: non-smoker.     Current Outpatient Medications:  .  BAYER MICROLET LANCETS lancets, Check Blood Sugar Once Daily., Disp: 30 each, Rfl: 5 .  ENBREL SURECLICK 50 MG/ML injection, , Disp: , Rfl:  .  glucose blood (ONE TOUCH ULTRA TEST) test strip, Use as instructed to check blood glucose one time daily. Dx Code E11.9, Disp: 100 each, Rfl: 3 .  JARDIANCE 10 MG TABS tablet, TAKE 1 TABLET BY MOUTH  DAILY, Disp: 90 tablet, Rfl: 0 .  naproxen sodium (ANAPROX) 220 MG tablet, Take 220 mg by mouth 2 (two) times daily with a meal.  , Disp: , Rfl:   EXAM:  VITALS per patient if applicable:  GENERAL: alert, oriented, appears well and in no acute distress  HEENT: atraumatic, conjunttiva clear, no obvious abnormalities on inspection of external nose and ears  NECK: normal movements of the head and neck  LUNGS: on inspection no signs of respiratory distress, breathing rate appears normal, no obvious gross SOB, gasping or wheezing  CV: no obvious cyanosis  MS: moves all visible extremities without noticeable abnormality  PSYCH/NEURO: pleasant and cooperative, no obvious depression or anxiety, speech and thought processing grossly intact  ASSESSMENT AND PLAN:  Discussed the following assessment and plan:  Type 2 diabetes mellitus without complication, without long-term current use of insulin (HCC) -  Plan: Hemoglobin A1c, Hemoglobin A1c  Essential hypertension - Plan: Basic metabolic panel, Hepatic function panel, Hepatic function panel, Basic metabolic panel  Hyperlipidemia, unspecified hyperlipidemia type - Plan: Lipid panel, Lipid panel       I discussed the assessment and treatment plan with the patient. The patient was provided an opportunity to ask questions and all were answered. The patient agreed with the plan and demonstrated an understanding of the  instructions.   The patient was advised to call back or seek an in-person evaluation if the symptoms worsen or if the condition fails to improve as anticipated.  I provided 20 minutes of non-face-to-face time during this encounter.   Carolann Littler, MD

## 2018-09-02 NOTE — Telephone Encounter (Signed)
Same dose   Refill for one year.

## 2018-09-02 NOTE — Telephone Encounter (Signed)
OK to continue same dose?

## 2018-09-03 DIAGNOSIS — L409 Psoriasis, unspecified: Secondary | ICD-10-CM | POA: Diagnosis not present

## 2018-09-03 DIAGNOSIS — L4059 Other psoriatic arthropathy: Secondary | ICD-10-CM | POA: Diagnosis not present

## 2018-09-03 DIAGNOSIS — M2550 Pain in unspecified joint: Secondary | ICD-10-CM | POA: Diagnosis not present

## 2018-09-03 DIAGNOSIS — M064 Inflammatory polyarthropathy: Secondary | ICD-10-CM | POA: Diagnosis not present

## 2018-09-06 ENCOUNTER — Telehealth: Payer: Self-pay | Admitting: *Deleted

## 2018-09-06 DIAGNOSIS — E785 Hyperlipidemia, unspecified: Secondary | ICD-10-CM

## 2018-09-06 MED ORDER — ATORVASTATIN CALCIUM 20 MG PO TABS: 20.0000 mg | ORAL_TABLET | Freq: Every day | ORAL | 0 refills | Status: DC

## 2018-09-06 NOTE — Telephone Encounter (Signed)
-----   Message from [name], MD sent at 09/03/2018  8:23 AM EDT ----- A1C remains unchanged from last year at 7.0%.   Lipids are high.  If he is willing, advise start Lipitor 20 mg po qd and repeat lipids and hepatic in 2 months (if starts the statin).

## 2018-09-06 NOTE — Telephone Encounter (Signed)
Patient notified per Dr. Elease Hashimoto A1C remains unchanged from last year at 7.0%.  Lipids are high. If he is willing, advise start Lipitor 20 mg po qd and repeat lipids and hepatic in 2 months (if starts the statin).  Patient verbalized understanding. 3 month supply sent to [facility] and if patient does well will send 90 day rx to [facility] patient verbalized understanding. Lab Appointment made for 11/06/2018 at 0830 and lab orders entered as well.

## 2018-11-06 ENCOUNTER — Other Ambulatory Visit: Payer: Self-pay

## 2019-02-25 ENCOUNTER — Encounter: Payer: Self-pay | Admitting: Internal Medicine

## 2019-02-27 DIAGNOSIS — L570 Actinic keratosis: Secondary | ICD-10-CM | POA: Diagnosis not present

## 2019-02-27 DIAGNOSIS — L821 Other seborrheic keratosis: Secondary | ICD-10-CM | POA: Diagnosis not present

## 2019-02-27 DIAGNOSIS — L738 Other specified follicular disorders: Secondary | ICD-10-CM | POA: Diagnosis not present

## 2019-02-27 DIAGNOSIS — L400 Psoriasis vulgaris: Secondary | ICD-10-CM | POA: Diagnosis not present

## 2019-03-04 DIAGNOSIS — Z125 Encounter for screening for malignant neoplasm of prostate: Secondary | ICD-10-CM | POA: Diagnosis not present

## 2019-03-05 DIAGNOSIS — L4059 Other psoriatic arthropathy: Secondary | ICD-10-CM | POA: Diagnosis not present

## 2019-03-05 DIAGNOSIS — Z79899 Other long term (current) drug therapy: Secondary | ICD-10-CM | POA: Diagnosis not present

## 2019-03-05 DIAGNOSIS — L409 Psoriasis, unspecified: Secondary | ICD-10-CM | POA: Diagnosis not present

## 2019-03-05 DIAGNOSIS — M2550 Pain in unspecified joint: Secondary | ICD-10-CM | POA: Diagnosis not present

## 2019-03-11 DIAGNOSIS — Z8551 Personal history of malignant neoplasm of bladder: Secondary | ICD-10-CM | POA: Diagnosis not present

## 2019-03-11 DIAGNOSIS — Z125 Encounter for screening for malignant neoplasm of prostate: Secondary | ICD-10-CM | POA: Diagnosis not present

## 2019-03-12 ENCOUNTER — Other Ambulatory Visit: Payer: Self-pay | Admitting: Family Medicine

## 2019-03-12 ENCOUNTER — Other Ambulatory Visit: Payer: Self-pay

## 2019-03-12 ENCOUNTER — Ambulatory Visit (INDEPENDENT_AMBULATORY_CARE_PROVIDER_SITE_OTHER): Payer: BC Managed Care – PPO | Admitting: Family Medicine

## 2019-03-12 VITALS — BP 122/64 | HR 61 | Temp 97.2°F | Wt 202.3 lb

## 2019-03-12 DIAGNOSIS — Z23 Encounter for immunization: Secondary | ICD-10-CM | POA: Diagnosis not present

## 2019-03-12 DIAGNOSIS — Z0000 Encounter for general adult medical examination without abnormal findings: Secondary | ICD-10-CM

## 2019-03-12 DIAGNOSIS — Z1159 Encounter for screening for other viral diseases: Secondary | ICD-10-CM | POA: Diagnosis not present

## 2019-03-12 LAB — MICROALBUMIN / CREATININE URINE RATIO
Creatinine,U: 74.1 mg/dL
Microalb Creat Ratio: 0.9 mg/g (ref 0.0–30.0)
Microalb, Ur: <0.7 mg/dL (ref 0.0–1.9)

## 2019-03-12 LAB — CBC WITH DIFFERENTIAL/PLATELET
Basophils Absolute: 0 10*3/uL (ref 0.0–0.1)
Basophils Relative: 0.8 % (ref 0.0–3.0)
Eosinophils Absolute: 0.1 10*3/uL (ref 0.0–0.7)
Eosinophils Relative: 2.2 % (ref 0.0–5.0)
HCT: 50.4 % (ref 39.0–52.0)
Hemoglobin: 17.1 g/dL — ABNORMAL HIGH (ref 13.0–17.0)
Lymphocytes Relative: 36.1 % (ref 12.0–46.0)
Lymphs Abs: 2.1 10*3/uL (ref 0.7–4.0)
MCHC: 33.8 g/dL (ref 30.0–36.0)
MCV: 97.6 fl (ref 78.0–100.0)
Monocytes Absolute: 0.4 10*3/uL (ref 0.1–1.0)
Monocytes Relative: 7.3 % (ref 3.0–12.0)
Neutro Abs: 3.1 10*3/uL (ref 1.4–7.7)
Neutrophils Relative %: 53.6 % (ref 43.0–77.0)
Platelets: 231 10*3/uL (ref 150.0–400.0)
RBC: 5.16 Mil/uL (ref 4.22–5.81)
RDW: 13.1 % (ref 11.5–15.5)
WBC: 5.9 10*3/uL (ref 4.0–10.5)

## 2019-03-12 LAB — TSH: TSH: 2.43 u[IU]/mL (ref 0.35–4.50)

## 2019-03-12 LAB — LIPID PANEL
Cholesterol: 212 mg/dL — ABNORMAL HIGH (ref 0–200)
HDL: 52.6 mg/dL (ref >39.00)
LDL Cholesterol: 135 mg/dL — ABNORMAL HIGH (ref 0–99)
NonHDL: 159.42
Total CHOL/HDL Ratio: 4
Triglycerides: 124 mg/dL (ref 0.0–149.0)
VLDL: 24.8 mg/dL (ref 0.0–40.0)

## 2019-03-12 LAB — BASIC METABOLIC PANEL
BUN: 18 mg/dL (ref 6–23)
CO2: 30 mEq/L (ref 19–32)
Calcium: 10.2 mg/dL (ref 8.4–10.5)
Chloride: 102 mEq/L (ref 96–112)
Creatinine, Ser: 1.04 mg/dL (ref 0.40–1.50)
GFR: 72.02 mL/min (ref >60.00)
Glucose, Bld: 147 mg/dL — ABNORMAL HIGH (ref 70–99)
Potassium: 4.8 mEq/L (ref 3.5–5.1)
Sodium: 140 mEq/L (ref 135–145)

## 2019-03-12 LAB — HEPATIC FUNCTION PANEL
ALT: 27 U/L (ref 0–53)
AST: 18 U/L (ref 0–37)
Albumin: 4.5 g/dL (ref 3.5–5.2)
Alkaline Phosphatase: 81 U/L (ref 39–117)
Bilirubin, Direct: 0.1 mg/dL (ref 0.0–0.3)
Total Bilirubin: 0.6 mg/dL (ref 0.2–1.2)
Total Protein: 7.8 g/dL (ref 6.0–8.3)

## 2019-03-12 LAB — HEMOGLOBIN A1C: Hgb A1c MFr Bld: 6.8 % — ABNORMAL HIGH (ref 4.6–6.5)

## 2019-03-12 MED ORDER — ROSUVASTATIN CALCIUM 10 MG PO TABS: 10.0000 mg | ORAL_TABLET | Freq: Every day | ORAL | 0 refills | Status: DC

## 2019-03-12 NOTE — Patient Instructions (Signed)
Set up repeat colonoscopy with Dr Carlean Purl  Set up eye exam  Consider Shingrix vaccine and check on insurance coverage if interested.

## 2019-03-12 NOTE — Progress Notes (Signed)
Subjective:     Patient ID: Joshua [name], male   DOB: August 10, 1955, 63 y.o.   MRN: [id number]  HPI Joshua [name] is seen for physical exam.  He retired a couple years ago.  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  He plays golf about 2 times per week.  Lost about 35 pounds couple years ago but has been fairly good at maintaining that.  Chronic problems include type 2 diabetes, hypertension history (although controlled now with weight loss).  He has psoriatic arthritis, hyperlipidemia, history of bladder cancer.  Followed regularly by urology and had recent PSA which was normal.  He remains on Enbrel.  He declines flu vaccinations.  No history of pneumonia vaccine.  No history of hepatitis C testing.  Needs follow-up A1c.  Due for repeat colonoscopy.  He did have adenomatous polyp on colonoscopy 5 years ago.  No history of shingles vaccine.  Overdue for eye exam.  Past Medical History:  Diagnosis Date  . BLADDER CANCER 04/05/2009  . DIABETES MELLITUS, TYPE II 04/05/2009   pt lost 55 lbs dm has been reversed per the pt  . History of colonic polyp - sessile serrated polyp 03/12/2014  . HYPERLIPIDEMIA 04/05/2009  . HYPERTENSION 04/05/2009   per the pt he lost 55 lbs and htn has been reversed  . OSTEOARTHRITIS 04/05/2009  . SHOULDER PAIN 01/05/2010   Past Surgical History:  Procedure Laterality Date  . CYSTOSCOPY  2009   tumor on bladder  . TONSILLECTOMY      reports that he has quit smoking. His smoking use included cigarettes. He has a 5.00 pack-year smoking history. He has never used smokeless tobacco. He reports current alcohol use of about 3.0 standard drinks of alcohol per week. He reports that he does not use drugs. family history includes Arthritis in his father; Diabetes in his mother; Hypertension in his mother. Allergies  Allergen Reactions  . Penicillins     REACTION: hives, fever blisters     Review of Systems  Constitutional: Negative for activity  change, appetite change, fatigue, fever and unexpected weight change.  HENT: Negative for congestion, ear pain and trouble swallowing.   Eyes: Negative for pain and visual disturbance.  Respiratory: Negative for cough, shortness of breath and wheezing.   Cardiovascular: Negative for chest pain and palpitations.  Gastrointestinal: Negative for abdominal distention, abdominal pain, blood in stool, constipation, diarrhea, nausea, rectal pain and vomiting.  Endocrine: Negative for polydipsia and polyuria.  Genitourinary: Negative for dysuria, hematuria and testicular pain.  Musculoskeletal: Negative for joint swelling.  Skin: Negative for rash.  Neurological: Negative for dizziness, syncope and headaches.  Hematological: Negative for adenopathy.  Psychiatric/Behavioral: Negative for confusion and dysphoric mood.       Objective:   Physical Exam Constitutional:      General: He is not in acute distress.    Appearance: He is well-developed.  HENT:     Head: Normocephalic and atraumatic.     Right Ear: External ear normal.     Left Ear: External ear normal.  Eyes:     Conjunctiva/sclera: Conjunctivae normal.     Pupils: Pupils are equal, round, and reactive to light.  Neck:     Musculoskeletal: Normal range of motion and neck supple.     Thyroid: No thyromegaly.  Cardiovascular:     Rate and Rhythm: Normal rate and regular rhythm.     Heart sounds: Normal heart sounds. No murmur.  Pulmonary:  Effort: No respiratory distress.     Breath sounds: No wheezing or rales.  Abdominal:     General: Bowel sounds are normal. There is no distension.     Palpations: Abdomen is soft. There is no mass.     Tenderness: There is no abdominal tenderness. There is no guarding or rebound.  Musculoskeletal:     Right lower leg: No edema.     Left lower leg: No edema.  Lymphadenopathy:     Cervical: No cervical adenopathy.  Skin:    Findings: No rash.     Comments: Feet reveal no skin lesions.  Good distal foot pulses. Good capillary refill. No calluses. Normal sensation with monofilament testing   Neurological:     Mental Status: He is alert and oriented to person, place, and time.     Cranial Nerves: No cranial nerve deficit.     Deep Tendon Reflexes: Reflexes normal.        Assessment:     Physical exam.  He has chronic problems as above and we addressed several health maintenance issues as below    Plan:     -Flu vaccine recommended but declined -Recommend Prevnar 13 with his history of Enbrel use -Recommend hepatitis C antibody -Check labs including A1c.  Defer PSA since he just saw urologist recently -Set up diabetic eye exam -Patient will call to set up repeat colonoscopy -Discussed shingles vaccine and he will check on insurance coverage  Joshua Post MD [facility] at [facility]

## 2019-03-13 LAB — HEPATITIS C ANTIBODY
Hepatitis C Ab: NONREACTIVE
SIGNAL TO CUT-OFF: 0.02 (ref <1.00)

## 2019-03-21 ENCOUNTER — Encounter: Payer: Self-pay | Admitting: Internal Medicine

## 2019-04-06 DIAGNOSIS — Z8616 Personal history of COVID-19: Secondary | ICD-10-CM

## 2019-04-06 HISTORY — DX: Personal history of COVID-19: Z86.16

## 2019-04-24 DIAGNOSIS — U071 COVID-19: Secondary | ICD-10-CM | POA: Diagnosis not present

## 2019-05-01 ENCOUNTER — Emergency Department (HOSPITAL_COMMUNITY)
Admission: EM | Admit: 2019-05-01 | Discharge: 2019-05-02 | Disposition: A | Discharge status: Home / Self Care | Payer: BC Managed Care – PPO | Attending: Emergency Medicine | Admitting: Emergency Medicine

## 2019-05-01 ENCOUNTER — Other Ambulatory Visit: Payer: Self-pay

## 2019-05-01 ENCOUNTER — Encounter (HOSPITAL_COMMUNITY): Payer: Self-pay | Admitting: Emergency Medicine

## 2019-05-01 ENCOUNTER — Emergency Department (HOSPITAL_COMMUNITY): Payer: BC Managed Care – PPO

## 2019-05-01 DIAGNOSIS — R0602 Shortness of breath: Secondary | ICD-10-CM | POA: Diagnosis not present

## 2019-05-01 DIAGNOSIS — Z87891 Personal history of nicotine dependence: Secondary | ICD-10-CM | POA: Diagnosis not present

## 2019-05-01 DIAGNOSIS — J1289 Other viral pneumonia: Secondary | ICD-10-CM | POA: Insufficient documentation

## 2019-05-01 DIAGNOSIS — E119 Type 2 diabetes mellitus without complications: Secondary | ICD-10-CM | POA: Insufficient documentation

## 2019-05-01 DIAGNOSIS — J029 Acute pharyngitis, unspecified: Secondary | ICD-10-CM | POA: Diagnosis not present

## 2019-05-01 DIAGNOSIS — I10 Essential (primary) hypertension: Secondary | ICD-10-CM | POA: Diagnosis not present

## 2019-05-01 DIAGNOSIS — R0789 Other chest pain: Secondary | ICD-10-CM | POA: Insufficient documentation

## 2019-05-01 DIAGNOSIS — Z8551 Personal history of malignant neoplasm of bladder: Secondary | ICD-10-CM | POA: Insufficient documentation

## 2019-05-01 DIAGNOSIS — J1282 Pneumonia due to coronavirus disease 2019: Secondary | ICD-10-CM

## 2019-05-01 DIAGNOSIS — R509 Fever, unspecified: Secondary | ICD-10-CM | POA: Diagnosis not present

## 2019-05-01 DIAGNOSIS — U071 COVID-19: Secondary | ICD-10-CM

## 2019-05-01 HISTORY — DX: COVID-19: U07.1

## 2019-05-01 LAB — CBC WITH DIFFERENTIAL/PLATELET
Abs Immature Granulocytes: 0.01 10*3/uL (ref 0.00–0.07)
Basophils Absolute: 0 10*3/uL (ref 0.0–0.1)
Basophils Relative: 0 %
Eosinophils Absolute: 0 10*3/uL (ref 0.0–0.5)
Eosinophils Relative: 0 %
HCT: 52.8 % — ABNORMAL HIGH (ref 39.0–52.0)
Hemoglobin: 18.1 g/dL — ABNORMAL HIGH (ref 13.0–17.0)
Immature Granulocytes: 0 %
Lymphocytes Relative: 21 %
Lymphs Abs: 1 10*3/uL (ref 0.7–4.0)
MCH: 32.3 pg (ref 26.0–34.0)
MCHC: 34.3 g/dL (ref 30.0–36.0)
MCV: 94.3 fL (ref 80.0–100.0)
Monocytes Absolute: 0.5 10*3/uL (ref 0.1–1.0)
Monocytes Relative: 11 %
Neutro Abs: 3.3 10*3/uL (ref 1.7–7.7)
Neutrophils Relative %: 68 %
Platelets: 154 10*3/uL (ref 150–400)
RBC: 5.6 MIL/uL (ref 4.22–5.81)
RDW: 12.3 % (ref 11.5–15.5)
WBC: 4.8 10*3/uL (ref 4.0–10.5)
nRBC: 0 % (ref 0.0–0.2)

## 2019-05-01 NOTE — ED Triage Notes (Signed)
Patient tested positive for COVID 19 Wednesday this week reports SOB and occasional dry cough and mild loss of taste. Denies fever or chills .

## 2019-05-02 LAB — BASIC METABOLIC PANEL
Anion gap: 15 (ref 5–15)
BUN: 21 mg/dL (ref 8–23)
CO2: 18 mmol/L — ABNORMAL LOW (ref 22–32)
Calcium: 9.2 mg/dL (ref 8.9–10.3)
Chloride: 106 mmol/L (ref 98–111)
Creatinine, Ser: 1.29 mg/dL — ABNORMAL HIGH (ref 0.61–1.24)
GFR calc Af Amer: >60 mL/min (ref >60)
GFR calc non Af Amer: 59 mL/min — ABNORMAL LOW (ref >60)
Glucose, Bld: 144 mg/dL — ABNORMAL HIGH (ref 70–99)
Potassium: 3.7 mmol/L (ref 3.5–5.1)
Sodium: 139 mmol/L (ref 135–145)

## 2019-05-02 MED ORDER — ALBUTEROL SULFATE HFA 108 (90 BASE) MCG/ACT IN AERS
2.0000 | INHALATION_SPRAY | Freq: Once | RESPIRATORY_TRACT | Status: AC
  Administered 2019-05-02: 2 via RESPIRATORY_TRACT
  Filled 2019-05-02: qty 6.7

## 2019-05-02 NOTE — ED Notes (Signed)
ED Provider at bedside. 

## 2019-05-02 NOTE — ED Notes (Signed)
Pt ambulated in room with sats dropping to 93%; Patient sats at rest is between 98%-93%; Pt c/o SOB while walking; Obvious SOB noted by RN-Monique,RN

## 2019-05-02 NOTE — ED Provider Notes (Signed)
[facility] EMERGENCY DEPARTMENT Provider Note   CSN: [id number] Arrival date & time: 05/01/19  2247     History   Chief Complaint Chief Complaint  Patient presents with  . Covid+ / SOB    HPI Joshua [name] is a 63 y.o. male.     The history is provided by the patient.  Shortness of Breath Severity:  Moderate Onset quality:  Gradual Timing:  Intermittent Progression:  Worsening Chronicity:  New Context: activity   Relieved by:  Rest Worsened by:  Exertion (lying flat) Associated symptoms: chest pain, cough, fever and sore throat   Associated symptoms: no hemoptysis   Patient with history of diabetes, hypertension presents with cough and shortness of breath.  Patient was diagnosed with COVID-19 on November 18.  He started having symptoms the day before.  Since that time he had intermittent shortness of breath and coughing.  He also has chest pain and sore throat.  He reports over the past several hours has had increasing shortness of breath. He is a non-smoker Past Medical History:  Diagnosis Date  . BLADDER CANCER 04/05/2009  . COVID-19   . DIABETES MELLITUS, TYPE II 04/05/2009   pt lost 55 lbs dm has been reversed per the pt  . History of colonic polyp - sessile serrated polyp 03/12/2014  . HYPERLIPIDEMIA 04/05/2009  . HYPERTENSION 04/05/2009   per the pt he lost 55 lbs and htn has been reversed  . OSTEOARTHRITIS 04/05/2009  . SHOULDER PAIN 01/05/2010    Patient Active Problem List   Diagnosis Date Noted  . Psoriatic arthritis ([place]) 02/21/2016  . History of colonic polyp - sessile serrated polyp 03/12/2014  . SHOULDER PAIN 01/05/2010  . BLADDER CANCER 04/05/2009  . Type 2 diabetes mellitus ([place]) 04/05/2009  . Hyperlipidemia 04/05/2009  . Essential hypertension 04/05/2009  . Osteoarthritis 04/05/2009    Past Surgical History:  Procedure Laterality Date  . CYSTOSCOPY  2009   tumor on bladder  . TONSILLECTOMY          Home Medications     Prior to Admission medications   Medication Sig Start Date End Date Taking? Authorizing Provider  BAYER MICROLET LANCETS lancets Check Blood Sugar Once Daily. 02/21/16   Burchette, Alinda Sierras, MD  ENBREL SURECLICK 50 MG/ML injection  12/08/16   [provider]  glucose blood (ONE TOUCH ULTRA TEST) test strip Use as instructed to check blood glucose one time daily. Dx Code E11.9 09/02/18   Burchette, Alinda Sierras, MD  JARDIANCE 10 MG TABS tablet TAKE 1 TABLET BY MOUTH  DAILY 09/03/18   Burchette, Alinda Sierras, MD  naproxen sodium (ANAPROX) 220 MG tablet Take 220 mg by mouth 2 (two) times daily with a meal.      [provider]  rosuvastatin (CRESTOR) 10 MG tablet Take 1 tablet (10 mg total) by mouth daily. 03/12/19   Burchette, Alinda Sierras, MD    Family History Family History  Problem Relation Age of Onset  . Diabetes Mother   . Hypertension Mother   . Arthritis Father     Social History Social History   Tobacco Use  . Smoking status: Former Smoker    Packs/day: 1.00    Years: 5.00    Pack years: 5.00    Types: Cigarettes  . Smokeless tobacco: Never Used  Substance Use Topics  . Alcohol use: Yes    Alcohol/week: 3.0 standard drinks    Types: 3 Cans of beer per week  . Drug  use: No     Allergies   Penicillins   Review of Systems Review of Systems  Constitutional: Positive for fatigue and fever.  HENT: Positive for sore throat.   Respiratory: Positive for cough and shortness of breath. Negative for hemoptysis.   Cardiovascular: Positive for chest pain.  All other systems reviewed and are negative.    Physical Exam Updated Vital Signs BP (!) 153/90 (BP Location: Right Arm)   Pulse (!) 111   Temp 99.9 F (37.7 C) (Oral)   Resp (!) 25   Ht 1.727 m (5\' 8" )   Wt 88.5 kg   SpO2 95%   BMI 29.65 kg/m   Physical Exam CONSTITUTIONAL: Well developed/well nourished HEAD: Normocephalic/atraumatic EYES: EOMI/PERRL ENMT: Mucous membranes moist NECK: supple no meningeal  signs SPINE/BACK:entire spine nontender CV: S1/S2 noted, no murmurs/rubs/gallops noted LUNGS: Tachypneic, crackles noted bilaterally ABDOMEN: soft, nontender, no rebound or guarding, bowel sounds noted throughout abdomen GU:no cva tenderness NEURO: Pt is awake/alert/appropriate, moves all extremitiesx4.  No facial droop.   EXTREMITIES: pulses normal/equal, full ROM, no lower extremity edema SKIN: warm, color normal PSYCH: no abnormalities of mood noted, alert and oriented to situation   ED Treatments / Results  Labs (all labs ordered are listed, but only abnormal results are displayed) Labs Reviewed  CBC WITH DIFFERENTIAL/PLATELET - Abnormal; Notable for the following components:      Result Value   Hemoglobin 18.1 (*)    HCT 52.8 (*)    All other components within normal limits  BASIC METABOLIC PANEL - Abnormal; Notable for the following components:   CO2 18 (*)    Glucose, Bld 144 (*)    Creatinine, Ser 1.29 (*)    GFR calc non Af Amer 59 (*)    All other components within normal limits    EKG EKG Interpretation  Date/Time:  Thursday May 01 2019 23:06:50 EST Ventricular Rate:  114 PR Interval:  156 QRS Duration: 94 QT Interval:  344 QTC Calculation: 474 R Axis:   19 Text Interpretation: Sinus tachycardia Otherwise normal ECG No previous ECGs available Confirmed by Ripley Fraise [phone]) on 05/02/2019 3:09:15 AM   Radiology Dg Chest Portable 1 View  Result Date: 05/01/2019 CLINICAL DATA:  Shortness of breath and COVID-19 positivity EXAM: PORTABLE CHEST 1 VIEW COMPARISON:  02/07/2017 FINDINGS: Cardiac shadow is mildly enlarged but stable. The lungs are hypoinflated. Patchy parenchymal opacities are noted particularly in the left lung base consistent with the given clinical history. No sizable effusion is noted. No bony abnormality is noted. IMPRESSION: Patchy airspace opacities particularly in the left base consistent with the given clinical history.  Electronically Signed   By: Inez Catalina M.D.   On: 05/01/2019 23:53    Procedures Procedures (including critical care time)  Medications Ordered in ED Medications  albuterol (VENTOLIN HFA) 108 (90 Base) MCG/ACT inhaler 2 puff (has no administration in time range)     Initial Impression / Assessment and Plan / ED Course  I have reviewed the triage vital signs and the nursing notes.  Pertinent labs & imaging results that were available during my care of the patient were reviewed by me and considered in my medical decision making (see chart for details).       5:45 AM Patient presented with increasing shortness of breath due to Covid.  He has had symptoms for over a week. However he noted his shortness of breath did worsen the past day X-ray did show some opacities He appears mildly tachypneic  but is in otherwise no distress.  At rest his pulse ox ranges from 93 to 95%.  When he walked his pulse ox dropped to 93% He has not required oxygen at this time I gave the patient the option of admission and monitoring.  Patient reports he is actually feeling improved and would prefer to go home I discussed that he may want to purchase a pulse ox from the pharmacy to check his oxygenation at home. I discussed that if his pulse ox drops into the  low 90s he needs to call 911 Also had a long conversation with patient about strict return precaution Patient understands signs and symptoms of when to call 911    Joshua [name] was evaluated in Emergency Department on 05/02/2019 for the symptoms described in the history of present illness. He was evaluated in the context of the global COVID-19 pandemic, which necessitated consideration that the patient might be at risk for infection with the SARS-CoV-2 virus that causes COVID-19. Institutional protocols and algorithms that pertain to the evaluation of patients at risk for COVID-19 are in a state of rapid change based on information released by  regulatory bodies including the CDC and federal and state organizations. These policies and algorithms were followed during the patient's care in the ED.  Final Clinical Impressions(s) / ED Diagnoses   Final diagnoses:  Pneumonia due to COVID-19 virus    ED Discharge Orders    None       Ripley Fraise, MD 05/02/19 [phone]

## 2019-08-29 ENCOUNTER — Other Ambulatory Visit: Payer: Self-pay | Admitting: Family Medicine

## 2019-09-02 LAB — HM DIABETES EYE EXAM

## 2019-09-03 DIAGNOSIS — Z79899 Other long term (current) drug therapy: Secondary | ICD-10-CM | POA: Diagnosis not present

## 2019-09-03 DIAGNOSIS — L4059 Other psoriatic arthropathy: Secondary | ICD-10-CM | POA: Diagnosis not present

## 2019-09-03 DIAGNOSIS — M2550 Pain in unspecified joint: Secondary | ICD-10-CM | POA: Diagnosis not present

## 2019-09-03 DIAGNOSIS — L409 Psoriasis, unspecified: Secondary | ICD-10-CM | POA: Diagnosis not present

## 2019-10-01 ENCOUNTER — Other Ambulatory Visit: Payer: Self-pay | Admitting: Family Medicine

## 2020-03-01 ENCOUNTER — Encounter: Payer: Self-pay | Admitting: Gastroenterology

## 2020-03-01 DIAGNOSIS — L821 Other seborrheic keratosis: Secondary | ICD-10-CM | POA: Diagnosis not present

## 2020-03-01 DIAGNOSIS — L738 Other specified follicular disorders: Secondary | ICD-10-CM | POA: Diagnosis not present

## 2020-03-01 DIAGNOSIS — D1801 Hemangioma of skin and subcutaneous tissue: Secondary | ICD-10-CM | POA: Diagnosis not present

## 2020-03-01 DIAGNOSIS — L239 Allergic contact dermatitis, unspecified cause: Secondary | ICD-10-CM | POA: Diagnosis not present

## 2020-03-01 DIAGNOSIS — L570 Actinic keratosis: Secondary | ICD-10-CM | POA: Diagnosis not present

## 2020-03-10 DIAGNOSIS — L4059 Other psoriatic arthropathy: Secondary | ICD-10-CM | POA: Diagnosis not present

## 2020-03-10 DIAGNOSIS — M2550 Pain in unspecified joint: Secondary | ICD-10-CM | POA: Diagnosis not present

## 2020-03-10 DIAGNOSIS — Z111 Encounter for screening for respiratory tuberculosis: Secondary | ICD-10-CM | POA: Diagnosis not present

## 2020-03-10 DIAGNOSIS — Z79899 Other long term (current) drug therapy: Secondary | ICD-10-CM | POA: Diagnosis not present

## 2020-03-10 DIAGNOSIS — L409 Psoriasis, unspecified: Secondary | ICD-10-CM | POA: Diagnosis not present

## 2020-03-29 DIAGNOSIS — Z125 Encounter for screening for malignant neoplasm of prostate: Secondary | ICD-10-CM | POA: Diagnosis not present

## 2020-03-31 ENCOUNTER — Ambulatory Visit (INDEPENDENT_AMBULATORY_CARE_PROVIDER_SITE_OTHER): Payer: BC Managed Care – PPO | Admitting: Gastroenterology

## 2020-03-31 ENCOUNTER — Encounter: Payer: Self-pay | Admitting: Gastroenterology

## 2020-03-31 VITALS — BP 124/72 | HR 72 | Ht 68.0 in | Wt 202.5 lb

## 2020-03-31 DIAGNOSIS — K625 Hemorrhage of anus and rectum: Secondary | ICD-10-CM | POA: Diagnosis not present

## 2020-03-31 DIAGNOSIS — K603 Anal fistula: Secondary | ICD-10-CM

## 2020-03-31 DIAGNOSIS — Z86010 Personal history of colonic polyps: Secondary | ICD-10-CM | POA: Diagnosis not present

## 2020-03-31 MED ORDER — HYDROCORTISONE (PERIANAL) 2.5 % EX CREA: 1.0000 "application " | TOPICAL_CREAM | Freq: Two times a day (BID) | CUTANEOUS | 1 refills | Status: DC

## 2020-03-31 NOTE — Patient Instructions (Signed)
If you are age 64 or older, your body mass index should be between 23-30. Your Body mass index is 30.79 kg/m. If this is out of the aforementioned range listed, please consider follow up with your Primary Care Provider.  If you are age 93 or younger, your body mass index should be between 19-25. Your Body mass index is 30.79 kg/m. If this is out of the aformentioned range listed, please consider follow up with your Primary Care Provider.   We have sent the following medications to your pharmacy for you to pick up at your convenience: Hydrocortisone 2.5% apply rectally twice daily.  You have been scheduled for a colonoscopy. Please follow written instructions given to you at your visit today.  Please pick up your prep supplies at the pharmacy within the next 1-3 days. If you use inhalers (even only as needed), please bring them with you on the day of your procedure.  Due to recent changes in healthcare laws, you may see the results of your imaging and laboratory studies on MyChart before your provider has had a chance to review them.  We understand that in some cases there may be results that are confusing or concerning to you. Not all laboratory results come back in the same time frame and the provider may be waiting for multiple results in order to interpret others.  Please give [place] 48 hours in order for your provider to thoroughly review all the results before contacting the office for clarification of your results.

## 2020-03-31 NOTE — Progress Notes (Signed)
03/31/2020 Joshua [name] [id number] 08/07/1955   HISTORY OF PRESENT ILLNESS: This is a 64 year old male is a patient of Dr. Celesta Aver. He is here today with several different complaints. He says that for the past 2 years he has been having persistent rectal bleeding. Says that he sees blood every time that he wipes. He says that he has had some issues with severe constipation a couple of times a month where he will have to use some type of laxative. He describes his stools as rabbit pellets. Constipation has been worse over the past 2 to 3 months and never had been in the past. He says about 2 or 3 days of the week he has severe lower abdominal pain/cramping. He says that there is an area near his anus that he felt was the bleeding source. He saw dermatology and they said that he needed to see GI for concern for fistula.  His last colonoscopy was in October 2015 at which time he was found to have one sessile serrated polyp/adenoma removed. Was due for 5-year recall.  Past Medical History:  Diagnosis Date  . BLADDER CANCER 04/05/2009  . COVID-19   . DIABETES MELLITUS, TYPE II 04/05/2009   pt lost 55 lbs dm has been reversed per the pt  . History of colonic polyp - sessile serrated polyp 03/12/2014  . HYPERLIPIDEMIA 04/05/2009  . HYPERTENSION 04/05/2009   per the pt he lost 55 lbs and htn has been reversed  . OSTEOARTHRITIS 04/05/2009  . SHOULDER PAIN 01/05/2010   Past Surgical History:  Procedure Laterality Date  . COLONOSCOPY    . CYSTOSCOPY  2009   tumor on bladder  . TONSILLECTOMY      reports that he has quit smoking. His smoking use included cigarettes. He has a 5.00 pack-year smoking history. He has never used smokeless tobacco. He reports current alcohol use of about 2.0 standard drinks of alcohol per week. He reports that he does not use drugs. family history includes Arthritis in his father; Crohn's disease in his daughter; Diabetes in his mother; Hypertension in his  mother. Allergies  Allergen Reactions  . Penicillins     REACTION: hives, fever blisters      Outpatient Encounter Medications as of 03/31/2020  Medication Sig  . BAYER MICROLET LANCETS lancets Check Blood Sugar Once Daily.  Scarlette Shorts SURECLICK 50 MG/ML injection   . JARDIANCE 10 MG TABS tablet TAKE 1 TABLET BY MOUTH  DAILY  . naproxen sodium (ANAPROX) 220 MG tablet Take 220 mg by mouth 2 (two) times daily with a meal.    . ONETOUCH ULTRA test strip USE AS DIRECTED TO CHECK BLOOD GLUCOSE ONE TIME DAILY  . [DISCONTINUED] rosuvastatin (CRESTOR) 10 MG tablet Take 1 tablet (10 mg total) by mouth daily. (Patient not taking: Reported on 03/31/2020)   No facility-administered encounter medications on file as of 03/31/2020.     REVIEW OF SYSTEMS  : All other systems reviewed and negative except where noted in the History of Present Illness.   PHYSICAL EXAM: BP 124/72 (BP Location: Left Arm, Patient Position: Sitting, Cuff Size: Normal)   Pulse 72   Ht 5\' 8"  (1.727 m)   Wt 202 lb 8 oz (91.9 kg)   SpO2 98%   BMI 30.79 kg/m  General: Well developed male in no acute distress Head: Normocephalic and atraumatic Eyes:  Sclerae anicteric, conjunctiva pink. Ears: Normal auditory acuity Lungs: Clear throughout to auscultation; no W/R/R. Heart: Regular rate and rhythm;  no M/R/G. Abdomen: Soft, non-distended.  BS present.  Non-tender. 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  They are very close to each other.  The larger one was oozing drips of blood during the exam.  Nontender.  DRE did not reveal any masses or tenderness.  No stool was noted on exam glove.  Anoscopy not performed. Musculoskeletal: Symmetrical with no gross deformities  Skin: No lesions on visible extremities Extremities: No edema  Neurological: Alert oriented x 4, grossly non-focal Psychological:  Alert and cooperative. Normal mood and affect  ASSESSMENT  AND PLAN: *Personal history of colon polyps: Last colonoscopy was October 2015 at which time he had 1 sessile serrated polyp/adenoma removed.  Was due for repeat 03/2019.  We will plan for colonoscopy with Dr. Carlean Purl, which is already scheduled for late November.  The risks, benefits, and alternatives to colonoscopy were discussed with the patient and he consents to proceed.  *? Perianal fistula: Seen on exam today, 1 oozing/dripping blood.  Dr. Carlean Purl will re-assess at the time of colonoscopy. *Constipation: We discussed beginning MiraLAX daily.   CC:  [name], MD

## 2020-04-05 DIAGNOSIS — Z8042 Family history of malignant neoplasm of prostate: Secondary | ICD-10-CM | POA: Diagnosis not present

## 2020-04-05 DIAGNOSIS — Z8551 Personal history of malignant neoplasm of bladder: Secondary | ICD-10-CM | POA: Diagnosis not present

## 2020-04-05 DIAGNOSIS — N400 Enlarged prostate without lower urinary tract symptoms: Secondary | ICD-10-CM | POA: Diagnosis not present

## 2020-05-03 ENCOUNTER — Telehealth: Payer: Self-pay | Admitting: Internal Medicine

## 2020-05-03 ENCOUNTER — Other Ambulatory Visit: Payer: Self-pay | Admitting: Internal Medicine

## 2020-05-03 DIAGNOSIS — Z1159 Encounter for screening for other viral diseases: Secondary | ICD-10-CM | POA: Diagnosis not present

## 2020-05-03 LAB — SARS CORONAVIRUS 2 (TAT 6-24 HRS): SARS Coronavirus 2: NEGATIVE

## 2020-05-03 NOTE — Telephone Encounter (Signed)
Called patient back and told him he could go for his coVId test this morning. Will call [facility] to let them know.

## 2020-05-04 ENCOUNTER — Other Ambulatory Visit: Payer: Self-pay

## 2020-05-04 ENCOUNTER — Encounter: Payer: Self-pay | Admitting: Internal Medicine

## 2020-05-04 ENCOUNTER — Ambulatory Visit (AMBULATORY_SURGERY_CENTER): Payer: BC Managed Care – PPO | Admitting: Internal Medicine

## 2020-05-04 VITALS — BP 130/69 | HR 61 | Temp 96.6°F | Resp 16 | Ht 68.0 in | Wt 202.0 lb

## 2020-05-04 DIAGNOSIS — D120 Benign neoplasm of cecum: Secondary | ICD-10-CM | POA: Diagnosis not present

## 2020-05-04 DIAGNOSIS — K51411 Inflammatory polyps of colon with rectal bleeding: Secondary | ICD-10-CM | POA: Diagnosis not present

## 2020-05-04 DIAGNOSIS — K648 Other hemorrhoids: Secondary | ICD-10-CM | POA: Diagnosis not present

## 2020-05-04 DIAGNOSIS — K5730 Diverticulosis of large intestine without perforation or abscess without bleeding: Secondary | ICD-10-CM | POA: Diagnosis not present

## 2020-05-04 DIAGNOSIS — K5140 Inflammatory polyps of colon without complications: Secondary | ICD-10-CM | POA: Diagnosis not present

## 2020-05-04 DIAGNOSIS — K625 Hemorrhage of anus and rectum: Secondary | ICD-10-CM

## 2020-05-04 DIAGNOSIS — K603 Anal fistula: Secondary | ICD-10-CM

## 2020-05-04 DIAGNOSIS — K5900 Constipation, unspecified: Secondary | ICD-10-CM | POA: Diagnosis not present

## 2020-05-04 DIAGNOSIS — D125 Benign neoplasm of sigmoid colon: Secondary | ICD-10-CM

## 2020-05-04 MED ORDER — SODIUM CHLORIDE 0.9 % IV SOLN: 500.0000 mL | Freq: Once | INTRAVENOUS | Status: DC

## 2020-05-04 NOTE — Progress Notes (Signed)
A/ox3, pleased with MAC, report to RN 

## 2020-05-04 NOTE — Progress Notes (Signed)
Vs in adm by N Campbell,LPN

## 2020-05-04 NOTE — Op Note (Signed)
[facility] Patient Name: Joshua [name] Procedure Date: 05/04/2020 10:28 AM MRN: [id number] Endoscopist: Gatha Mayer , MD Age: 64 Referring MD:  Date of Birth: 1956/02/21 Gender: Male Account #: [id number] Procedure:                Colonoscopy Indications:              Rectal bleeding Medicines:                Propofol per Anesthesia, Monitored Anesthesia Care Procedure:                Pre-Anesthesia Assessment:                           - Prior to the procedure, a History and Physical                            was performed, and patient medications and                            allergies were reviewed. The patient's tolerance of                            previous anesthesia was also reviewed. The risks                            and benefits of the procedure and the sedation                            options and risks were discussed with the patient.                            All questions were answered, and informed consent                            was obtained. Prior Anticoagulants: The patient has                            taken no previous anticoagulant or antiplatelet                            agents. ASA Grade Assessment: II - A patient with                            mild systemic disease. After reviewing the risks                            and benefits, the patient was deemed in                            satisfactory condition to undergo the procedure.                           After obtaining informed consent, the colonoscope  was passed under direct vision. Throughout the                            procedure, the patient's blood pressure, pulse, and                            oxygen saturations were monitored continuously. The                            Colonoscope was introduced through the anus and                            advanced to the the terminal ileum, with                            identification of the  appendiceal orifice and IC                            valve. The [place] was introduced                            through the and advanced to the. The colonoscopy                            was performed with difficulty due to restricted                            mobility of the colon. Successful completion of the                            procedure was aided by withdrawing the scope and                            replacing with the pediatric colonoscope. The bowel                            preparation used was Miralax via split dose                            instruction. The ileocecal valve, appendiceal                            orifice, and rectum were photographed. The patient                            tolerated the procedure well. The terminal ileum,                            ileocecal valve, appendiceal orifice, and rectum                            were photographed. Scope In: 10:39:25 AM Scope Out: 11:17:05 AM Scope Withdrawal Time: 0 hours 26 minutes 38 seconds  Total  Procedure Duration: 0 hours 37 minutes 40 seconds  Findings:                 Hemorrhoids were found on perianal exam.                           The digital rectal exam was normal.                           A 30 mm polyp was found in the cecum. The polyp was                            sessile. The polyp was removed with a piecemeal                            technique using a hot snare. Resection and                            retrieval were complete. Verification of patient                            identification for the specimen was done. Estimated                            blood loss was minimal. To prevent bleeding after                            the polypectomy, five hemostatic clips were                            successfully placed (MR conditional).                           Multiple diverticula were found in the sigmoid                            colon. There was narrowing of the  colon in                            association with the diverticular opening.                           A diminutive polyp was found in the sigmoid colon.                            The polyp was sessile. The polyp was removed with a                            cold snare. Resection and retrieval were complete.                            Verification of patient identification for the  specimen was done. Estimated blood loss was minimal.                           External hemorrhoids were found.                           The exam was otherwise without abnormality on                            direct and retroflexion views. Complications:            No immediate complications. Estimated Blood Loss:     Estimated blood loss was minimal. Impression:               - Hemorrhoids found on perianal exam. External                            hemorrhoids are bleeding - no fistula identified                            today - seen 1 month ago                           - One 30 mm polyp in the cecum, removed piecemeal                            using a hot snare. Resected and retrieved. Clips                            (MR conditional) were placed.                           - Severe diverticulosis in the sigmoid colon. There                            was narrowing of the colon in association with the                            diverticular opening.                           - One diminutive polyp in the sigmoid colon,                            removed with a cold snare. Resected and retrieved.                           - External hemorrhoids.                           - The examination was otherwise normal on direct                            and retroflexion views.                           -  Personal history of colonic polyp 7 mm ssp 2015. Recommendation:           - Patient has a contact number available for                            emergencies. The signs and symptoms  of potential                            delayed complications were discussed with the                            patient. Return to normal activities tomorrow.                            Written discharge instructions were provided to the                            patient.                           - High fiber diet. Benefiber 1-2 tbsp qd also -                            think diverticulosis causing constipation                           - Patient has a contact number available for                            emergencies. The signs and symptoms of potential                            delayed complications were discussed with the                            patient. Return to normal activities tomorrow.                            Written discharge instructions were provided to the                            patient.                           - No aspirin, ibuprofen, naproxen, or other                            non-steroidal anti-inflammatory drugs for 2 weeks                            after polyp removal.                           - Repeat colonoscopy is recommended. The  colonoscopy date will be determined after pathology                            results from today's exam become available for                            review.                           - hydrocortisone cream prn Gatha Mayer, MD 05/04/2020 11:38:27 AM This report has been signed electronically.

## 2020-05-04 NOTE — Patient Instructions (Addendum)
I think you are bleeding from hemorrhoids in the anal canal also called external hemorrhoids.  I did not identify any fistulas today.  They could have resolved or receded.  Continue to use the hydrocortisone cream and we will reassess.  Constipation is coming from pretty severe diverticulosis and some narrowing of the colon.  I want you to increase fiber and fluids in your diet and you can add Benefiber 1 or 2 tablespoons daily.  There was a large polyp in the cecum, the very beginning of the colon that I removed.  Given the size I anticipate asking you to have this rechecked in about 6 months or so.  It looks benign but I will let you know the pathology results.  There was also another tiny polyps that I removed.  Please avoid aspirin and ibuprofen and Aleve etc. for the next 2 weeks to reduce the chance of bleeding from the polyp removal.  Acetaminophen or Tylenol is acceptable.  I appreciate the opportunity to care for you. Gatha Mayer, MD, [facility]   Handouts given for polyps, diverticulosis and high fiber diet.  Keep clip card in your wallet and notify your doctor if you are to have an MRI, the clips should slough off undetected in your stool in a matter of weeks.    YOU HAD AN ENDOSCOPIC PROCEDURE TODAY AT [facility]:   Refer to the procedure report that was given to you for any specific questions about what was found during the examination.  If the procedure report does not answer your questions, please call your gastroenterologist to clarify.  If you requested that your care partner not be given the details of your procedure findings, then the procedure report has been included in a sealed envelope for you to review at your convenience later.  YOU SHOULD EXPECT: Some feelings of bloating in the abdomen. Passage of more gas than usual.  Walking can help get rid of the air that was put into your GI tract during the procedure and reduce the bloating. If you had a lower  endoscopy (such as a colonoscopy or flexible sigmoidoscopy) you may notice spotting of blood in your stool or on the toilet paper. If you underwent a bowel prep for your procedure, you may not have a normal bowel movement for a few days.  Please Note:  You might notice some irritation and congestion in your nose or some drainage.  This is from the oxygen used during your procedure.  There is no need for concern and it should clear up in a day or so.  SYMPTOMS TO REPORT IMMEDIATELY:   Following lower endoscopy (colonoscopy or flexible sigmoidoscopy):  Excessive amounts of blood in the stool  Significant tenderness or worsening of abdominal pains  Swelling of the abdomen that is new, acute  Fever of 100F or higher  For urgent or emergent issues, a gastroenterologist can be reached at any hour by calling [phone]. Do not use MyChart messaging for urgent concerns.    DIET:  We do recommend a small meal at first, but then you may proceed to your regular diet.  Drink plenty of fluids but you should avoid alcoholic beverages for 24 hours.  ACTIVITY:  You should plan to take it easy for the rest of today and you should NOT DRIVE or use heavy machinery until tomorrow (because of the sedation medicines used during the test).    FOLLOW UP: Our staff will call the number listed on  your records 48-72 hours following your procedure to check on you and address any questions or concerns that you may have regarding the information given to you following your procedure. If we do not reach you, we will leave a message.  We will attempt to reach you two times.  During this call, we will ask if you have developed any symptoms of COVID 19. If you develop any symptoms (ie: fever, flu-like symptoms, shortness of breath, cough etc.) before then, please call [phone].  If you test positive for Covid 19 in the 2 weeks post procedure, please call and report this information to [place].    If any biopsies were  taken you will be contacted by phone or by letter within the next 1-3 weeks.  Please call us at [phone] if you have not heard about the biopsies in 3 weeks.    SIGNATURES/CONFIDENTIALITY: You and/or your care partner have signed paperwork which will be entered into your electronic medical record.  These signatures attest to the fact that that the information above on your After Visit Summary has been reviewed and is understood.  Full responsibility of the confidentiality of this discharge information lies with you and/or your care-partner.

## 2020-05-04 NOTE — Progress Notes (Signed)
Called to room to assist during endoscopic procedure.  Patient ID and intended procedure confirmed with present staff. Received instructions for my participation in the procedure from the performing physician.  

## 2020-05-06 ENCOUNTER — Telehealth: Payer: Self-pay

## 2020-05-06 NOTE — Telephone Encounter (Signed)
  Follow up Call-  Call back number 05/04/2020  Post procedure Call Back phone  # [phone]  Permission to leave phone message Yes  Some recent data might be hidden     Patient questions:  Do you have a fever, pain , or abdominal swelling? No. Pain Score  0 *  Have you tolerated food without any problems? Yes.    Have you been able to return to your normal activities? Yes.    Do you have any questions about your discharge instructions: Diet   No. Medications  No. Follow up visit  No.  Do you have questions or concerns about your Care? No.  Actions: * If pain score is 4 or above: No action needed, pain <4. 1. Have you developed a fever since your procedure? no  2.   Have you had an respiratory symptoms (SOB or cough) since your procedure? no  3.   Have you tested positive for COVID 19 since your procedure no  4.   Have you had any family members/close contacts diagnosed with the COVID 19 since your procedure?  no   If yes to any of these questions please route to Joylene John, RN and Joella Prince, RN

## 2020-05-16 ENCOUNTER — Encounter: Payer: Self-pay | Admitting: Internal Medicine

## 2020-06-13 ENCOUNTER — Other Ambulatory Visit: Payer: Self-pay | Admitting: Family Medicine

## 2020-09-08 DIAGNOSIS — Z79899 Other long term (current) drug therapy: Secondary | ICD-10-CM | POA: Diagnosis not present

## 2020-09-08 DIAGNOSIS — M2550 Pain in unspecified joint: Secondary | ICD-10-CM | POA: Diagnosis not present

## 2020-09-08 DIAGNOSIS — L4050 Arthropathic psoriasis, unspecified: Secondary | ICD-10-CM | POA: Diagnosis not present

## 2020-09-08 DIAGNOSIS — L409 Psoriasis, unspecified: Secondary | ICD-10-CM | POA: Diagnosis not present

## 2020-10-21 ENCOUNTER — Other Ambulatory Visit: Payer: Self-pay

## 2020-10-21 ENCOUNTER — Ambulatory Visit (AMBULATORY_SURGERY_CENTER): Payer: Self-pay | Admitting: *Deleted

## 2020-10-21 VITALS — Ht 68.0 in | Wt 195.0 lb

## 2020-10-21 DIAGNOSIS — Z86010 Personal history of colonic polyps: Secondary | ICD-10-CM

## 2020-10-21 NOTE — Progress Notes (Signed)
Patient's pre-visit was done today over the phone with the patient due to COVID-19 pandemic. Name,DOB and address verified. Insurance verified. Patient denies any allergies to Eggs and Soy. Patient denies any problems with anesthesia/sedation. Patient denies taking diet pills or blood thinners. Packet of Prep instructions mailed to patient including a copy of a consent form-pt is aware. Patient understands to call us back with any questions or concerns. Patient is aware of our care-partner policy and [id number] safety protocol. EMMI education assigned to the patient for the procedure, sent to [place]. The patient is COVID-19 vaccinated, per patient. Patient denies any medical , family hx changes since last GI visit.

## 2020-11-04 ENCOUNTER — Other Ambulatory Visit: Payer: Self-pay

## 2020-11-04 ENCOUNTER — Encounter: Payer: Self-pay | Admitting: Internal Medicine

## 2020-11-04 ENCOUNTER — Ambulatory Visit (AMBULATORY_SURGERY_CENTER): Payer: Medicare Other | Admitting: Internal Medicine

## 2020-11-04 VITALS — BP 100/62 | HR 60 | Temp 98.6°F | Resp 16 | Ht 68.0 in | Wt 195.0 lb

## 2020-11-04 DIAGNOSIS — K56699 Other intestinal obstruction unspecified as to partial versus complete obstruction: Secondary | ICD-10-CM

## 2020-11-04 DIAGNOSIS — Z86010 Personal history of colonic polyps: Secondary | ICD-10-CM | POA: Diagnosis not present

## 2020-11-04 DIAGNOSIS — D123 Benign neoplasm of transverse colon: Secondary | ICD-10-CM

## 2020-11-04 DIAGNOSIS — K635 Polyp of colon: Secondary | ICD-10-CM | POA: Diagnosis not present

## 2020-11-04 DIAGNOSIS — D120 Benign neoplasm of cecum: Secondary | ICD-10-CM

## 2020-11-04 DIAGNOSIS — Z1211 Encounter for screening for malignant neoplasm of colon: Secondary | ICD-10-CM | POA: Diagnosis not present

## 2020-11-04 MED ORDER — SODIUM CHLORIDE 0.9 % IV SOLN: 500.0000 mL | Freq: Once | INTRAVENOUS | Status: DC

## 2020-11-04 NOTE — Progress Notes (Signed)
PT taken to PACU. Monitors in place. VSS. Report given to RN. 

## 2020-11-04 NOTE — Patient Instructions (Addendum)
HANDOUTS PROVIDED: POLYPS  I found 2 tiny polyps and checked the site of pol;ypectomy with biopsies but big polyp seems gone.  I do not see a spot or nodule draining at the anus.  Will get a pelvic MRI to check it out - my office will arrange  I appreciate the opportunity to care for you. Gatha Mayer, MD, FACG   YOU HAD AN ENDOSCOPIC PROCEDURE TODAY AT [facility]:   Refer to the procedure report that was given to you for any specific questions about what was found during the examination.  If the procedure report does not answer your questions, please call your gastroenterologist to clarify.  If you requested that your care partner not be given the details of your procedure findings, then the procedure report has been included in a sealed envelope for you to review at your convenience later.  YOU SHOULD EXPECT: Some feelings of bloating in the abdomen. Passage of more gas than usual.  Walking can help get rid of the air that was put into your GI tract during the procedure and reduce the bloating. If you had a lower endoscopy (such as a colonoscopy or flexible sigmoidoscopy) you may notice spotting of blood in your stool or on the toilet paper. If you underwent a bowel prep for your procedure, you may not have a normal bowel movement for a few days.  Please Note:  You might notice some irritation and congestion in your nose or some drainage.  This is from the oxygen used during your procedure.  There is no need for concern and it should clear up in a day or so.  SYMPTOMS TO REPORT IMMEDIATELY:   Following lower endoscopy (colonoscopy or flexible sigmoidoscopy):  Excessive amounts of blood in the stool  Significant tenderness or worsening of abdominal pains  Swelling of the abdomen that is new, acute  Fever of 100F or higher  For urgent or emergent issues, a gastroenterologist can be reached at any hour by calling [phone]. Do not use MyChart messaging for urgent  concerns.    DIET:  We do recommend a small meal at first, but then you may proceed to your regular diet.  Drink plenty of fluids but you should avoid alcoholic beverages for 24 hours.  ACTIVITY:  You should plan to take it easy for the rest of today and you should NOT DRIVE or use heavy machinery until tomorrow (because of the sedation medicines used during the test).    FOLLOW UP: Our staff will call the number listed on your records Monday morning between 7:15 am and 8:15 am following your procedure to check on you and address any questions or concerns that you may have regarding the information given to you following your procedure. If we do not reach you, we will leave a message.  We will attempt to reach you two times.  During this call, we will ask if you have developed any symptoms of COVID 19. If you develop any symptoms (ie: fever, flu-like symptoms, shortness of breath, cough etc.) before then, please call [phone].  If you test positive for Covid 19 in the 2 weeks post procedure, please call and report this information to [place].    If any biopsies were taken you will be contacted by phone or by letter within the next 1-3 weeks.  Please call us at [phone] if you have not heard about the biopsies in 3 weeks.    SIGNATURES/CONFIDENTIALITY: You and/or your  care partner have signed paperwork which will be entered into your electronic medical record.  These signatures attest to the fact that that the information above on your After Visit Summary has been reviewed and is understood.  Full responsibility of the confidentiality of this discharge information lies with you and/or your care-partner.

## 2020-11-04 NOTE — Op Note (Addendum)
[place] Patient Name: Joshua [name] Procedure Date: 11/04/2020 10:43 AM MRN: [id number] Endoscopist: Gatha Mayer , MD Age: 65 Referring MD:  Date of Birth: 05-May-1956 Gender: Male Account #: [id number] Procedure:                Colonoscopy Indications:              Surveillance: Piecemeal removal of large sessile                            adenoma last colonoscopy (< 3 yrs) Medicines:                Propofol per Anesthesia, Monitored Anesthesia Care Procedure:                Pre-Anesthesia Assessment:                           - Prior to the procedure, a History and Physical                            was performed, and patient medications and                            allergies were reviewed. The patient's tolerance of                            previous anesthesia was also reviewed. The risks                            and benefits of the procedure and the sedation                            options and risks were discussed with the patient.                            All questions were answered, and informed consent                            was obtained. Prior Anticoagulants: The patient has                            taken no previous anticoagulant or antiplatelet                            agents. ASA Grade Assessment: II - A patient with                            mild systemic disease. After reviewing the risks                            and benefits, the patient was deemed in                            satisfactory condition to undergo the procedure.  After obtaining informed consent, the colonoscope                            was passed under direct vision. Throughout the                            procedure, the patient's blood pressure, pulse, and                            oxygen saturations were monitored continuously. The                            Olympus [id number]) [id number] was introduced                             through the anus and advanced to the the cecum,                            identified by appendiceal orifice and ileocecal                            valve. The Olympus [id number] (#[id number])                            Colonoscope was introduced through the anus and                            advanced to the the cecum, identified by                            appendiceal orifice and ileocecal valve. The                            colonoscopy was somewhat difficult due to bowel                            stenosis. Successful completion of the procedure                            was aided by withdrawing the scope and replacing                            with the pediatric colonoscope. The patient                            tolerated the procedure well. The quality of the                            bowel preparation was good. The ileocecal valve,                            appendiceal orifice, and rectum were photographed.  The bowel preparation used was Miralax via split                            dose instruction. Scope In: 11:01:55 AM Scope Out: 11:24:04 AM Scope Withdrawal Time: 0 hours 9 minutes 43 seconds  Total Procedure Duration: 0 hours 22 minutes 9 seconds  Findings:                 A 3 mm polyp was found in the transverse colon. The                            polyp was flat. The polyp was removed with a cold                            snare. Resection and retrieval were complete.                            Verification of patient identification for the                            specimen was done. Estimated blood loss was minimal.                           A 1 mm polyp was found in the cecum. The polyp was                            sessile. The polyp was removed with a cold biopsy                            forceps. Resection and retrieval were complete.                            Verification of patient identification for the                             specimen was done. Estimated blood loss was minimal.                           A post polypectomy scar was found in the cecum.                            There was no evidence of the previous polyp.                            Biopsies were taken with a cold forceps for                            histology. Verification of patient identification                            for the specimen was done. Estimated blood loss was  minimal.                           A benign-appearing, intrinsic stenosis was found in                            the distal sigmoid colon and was traversed.                           Diverticula were found in the sigmoid colon.                           Hemorrhoids were found on perianal exam.                           The exam was otherwise without abnormality on                            direct and retroflexion views. Complications:            No immediate complications. Estimated Blood Loss:     Estimated blood loss was minimal. Impression:               - One 3 mm polyp in the transverse colon, removed                            with a cold snare. Resected and retrieved.                           - One 1 mm polyp in the cecum, removed with a cold                            biopsy forceps. Resected and retrieved.                           - Post-polypectomy scar in the cecum. Biopsied.                           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 pediatric                            colonoscope required to traverse                           - Diverticulosis in the sigmoid colon.                           - Hemorrhoids found on perianal exam.                           - The examination was otherwise normal on direct                            and retroflexion views.                           -  Personal history of colonic polyps - 30 mm TV                            adenoma removed by snare 04/2020. Recommendation:           - Patient  has a contact number available for                            emergencies. The signs and symptoms of potential                            delayed complications were discussed with the                            patient. Return to normal activities tomorrow.                            Written discharge instructions were provided to the                            patient.                           - Resume previous diet.                           - Continue present medications.                           - Repeat colonoscopy is recommended. The                            colonoscopy date will be determined after pathology                            results from today's exam become available for                            review.                           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

## 2020-11-04 NOTE — Progress Notes (Signed)
VS-CW  Pt's states no medical or surgical changes since previsit or office visit.  

## 2020-11-04 NOTE — Progress Notes (Signed)
Called to room to assist during endoscopic procedure.  Patient ID and intended procedure confirmed with present staff. Received instructions for my participation in the procedure from the performing physician.  

## 2020-11-08 ENCOUNTER — Telehealth: Payer: Self-pay | Admitting: *Deleted

## 2020-11-08 ENCOUNTER — Telehealth: Payer: Self-pay

## 2020-11-08 DIAGNOSIS — K56699 Other intestinal obstruction unspecified as to partial versus complete obstruction: Secondary | ICD-10-CM

## 2020-11-08 DIAGNOSIS — K603 Anal fistula: Secondary | ICD-10-CM

## 2020-11-08 NOTE — Telephone Encounter (Signed)
Patient will be contacted directly by [facility] Scheduling to arrange MRI pelvis w/wo contrast per procedure report on 11/04/20

## 2020-11-08 NOTE — Telephone Encounter (Signed)
  Follow up Call-  Call back number 11/04/2020 05/04/2020  Post procedure Call Back phone  # [phone]  Permission to leave phone message Yes Yes  Some recent data might be hidden     Patient questions:  Do you have a fever, pain , or abdominal swelling? No. Pain Score  0 *  Have you tolerated food without any problems? Yes.    Have you been able to return to your normal activities? Yes.    Do you have any questions about your discharge instructions: Diet   No. Medications  No. Follow up visit  No.  Do you have questions or concerns about your Care? No.  Actions: * If pain score is 4 or above: No action needed, pain <4.  1. Have you developed a fever since your procedure? no  2.   Have you had an respiratory symptoms (SOB or cough) since your procedure? no  3.   Have you tested positive for COVID 19 since your procedure no  4.   Have you had any family members/close contacts diagnosed with the COVID 19 since your procedure?  no   If yes to any of these questions please route to Joylene John, RN and Joella Prince, RN

## 2020-11-16 ENCOUNTER — Other Ambulatory Visit: Payer: Self-pay

## 2020-11-16 ENCOUNTER — Encounter: Payer: Self-pay | Admitting: Internal Medicine

## 2020-11-16 ENCOUNTER — Ambulatory Visit (HOSPITAL_COMMUNITY)
Admission: RE | Admit: 2020-11-16 | Discharge: 2020-11-16 | Disposition: A | Discharge status: Home / Self Care | Payer: Medicare Other | Source: Ambulatory Visit | Attending: Internal Medicine | Admitting: Internal Medicine

## 2020-11-16 DIAGNOSIS — K603 Anal fistula, unspecified: Secondary | ICD-10-CM

## 2020-11-16 DIAGNOSIS — K56699 Other intestinal obstruction unspecified as to partial versus complete obstruction: Secondary | ICD-10-CM

## 2020-11-16 DIAGNOSIS — K632 Fistula of intestine: Secondary | ICD-10-CM | POA: Diagnosis not present

## 2020-11-16 MED ORDER — GADOBUTROL 1 MMOL/ML IV SOLN
9.0000 mL | Freq: Once | INTRAVENOUS | Status: AC | PRN
  Administered 2020-11-16: 9 mL via INTRAVENOUS

## 2021-01-03 DIAGNOSIS — K603 Anal fistula: Secondary | ICD-10-CM | POA: Diagnosis not present

## 2021-01-03 DIAGNOSIS — L4050 Arthropathic psoriasis, unspecified: Secondary | ICD-10-CM | POA: Diagnosis not present

## 2021-01-04 ENCOUNTER — Other Ambulatory Visit: Payer: Self-pay | Admitting: Gastroenterology

## 2021-01-31 ENCOUNTER — Encounter (HOSPITAL_BASED_OUTPATIENT_CLINIC_OR_DEPARTMENT_OTHER): Payer: Self-pay | Admitting: Surgery

## 2021-01-31 ENCOUNTER — Other Ambulatory Visit: Payer: Self-pay

## 2021-01-31 NOTE — Progress Notes (Signed)
Spoke w/ via phone for pre-op interview--- Pt Lab needs dos----   Istat and EKG (per anes)/  pre-op orders pending            Lab results------ COVID test -----patient states asymptomatic no test needed Arrive at -------  0800 on 02-03-2021 NPO after MN NO Solid Food.  Clear liquids from MN until--- 0700 Med rec completed Medications to take morning of surgery ----- NONE Diabetic medication ----- do not take jardiance morning of surgery Patient instructed no nail polish to be worn day of surgery Patient instructed to bring photo id and insurance card day of surgery Patient aware to have Driver (ride ) / caregiver for 24 hours after surgery --wife, Joshua [name] Patient Special Instructions ----- n/a Pre-Op special Istructions ----- sent inbox message to dr white in epic, requested orders Patient verbalized understanding of instructions that were given at this phone interview. Patient denies shortness of breath, chest pain, fever, cough at this phone interview.

## 2021-02-02 ENCOUNTER — Ambulatory Visit: Payer: Self-pay | Admitting: Surgery

## 2021-02-03 ENCOUNTER — Ambulatory Visit (HOSPITAL_BASED_OUTPATIENT_CLINIC_OR_DEPARTMENT_OTHER): Payer: Medicare Other | Admitting: Anesthesiology

## 2021-02-03 ENCOUNTER — Encounter (HOSPITAL_BASED_OUTPATIENT_CLINIC_OR_DEPARTMENT_OTHER): Payer: Self-pay | Admitting: Surgery

## 2021-02-03 ENCOUNTER — Encounter (HOSPITAL_BASED_OUTPATIENT_CLINIC_OR_DEPARTMENT_OTHER)
Admission: RE | Disposition: A | Discharge status: Home / Self Care | Payer: Self-pay | Source: Home / Self Care | Attending: Surgery

## 2021-02-03 ENCOUNTER — Other Ambulatory Visit: Payer: Self-pay

## 2021-02-03 ENCOUNTER — Ambulatory Visit (HOSPITAL_BASED_OUTPATIENT_CLINIC_OR_DEPARTMENT_OTHER)
Admission: RE | Admit: 2021-02-03 | Discharge: 2021-02-03 | Disposition: A | Discharge status: Home / Self Care | Payer: Medicare Other | Attending: Surgery | Admitting: Surgery

## 2021-02-03 DIAGNOSIS — Z8551 Personal history of malignant neoplasm of bladder: Secondary | ICD-10-CM | POA: Insufficient documentation

## 2021-02-03 DIAGNOSIS — Z8616 Personal history of COVID-19: Secondary | ICD-10-CM | POA: Diagnosis not present

## 2021-02-03 DIAGNOSIS — K648 Other hemorrhoids: Secondary | ICD-10-CM | POA: Insufficient documentation

## 2021-02-03 DIAGNOSIS — L732 Hidradenitis suppurativa: Secondary | ICD-10-CM | POA: Insufficient documentation

## 2021-02-03 DIAGNOSIS — I10 Essential (primary) hypertension: Secondary | ICD-10-CM | POA: Diagnosis not present

## 2021-02-03 DIAGNOSIS — Z79899 Other long term (current) drug therapy: Secondary | ICD-10-CM | POA: Diagnosis not present

## 2021-02-03 DIAGNOSIS — L4050 Arthropathic psoriasis, unspecified: Secondary | ICD-10-CM | POA: Insufficient documentation

## 2021-02-03 DIAGNOSIS — Z8719 Personal history of other diseases of the digestive system: Secondary | ICD-10-CM | POA: Diagnosis not present

## 2021-02-03 DIAGNOSIS — K644 Residual hemorrhoidal skin tags: Secondary | ICD-10-CM | POA: Insufficient documentation

## 2021-02-03 DIAGNOSIS — E119 Type 2 diabetes mellitus without complications: Secondary | ICD-10-CM | POA: Insufficient documentation

## 2021-02-03 DIAGNOSIS — F17290 Nicotine dependence, other tobacco product, uncomplicated: Secondary | ICD-10-CM | POA: Insufficient documentation

## 2021-02-03 DIAGNOSIS — L98499 Non-pressure chronic ulcer of skin of other sites with unspecified severity: Secondary | ICD-10-CM | POA: Diagnosis not present

## 2021-02-03 DIAGNOSIS — K603 Anal fistula: Secondary | ICD-10-CM | POA: Diagnosis not present

## 2021-02-03 DIAGNOSIS — F17210 Nicotine dependence, cigarettes, uncomplicated: Secondary | ICD-10-CM | POA: Insufficient documentation

## 2021-02-03 DIAGNOSIS — Z880 Allergy status to penicillin: Secondary | ICD-10-CM | POA: Insufficient documentation

## 2021-02-03 DIAGNOSIS — Z7984 Long term (current) use of oral hypoglycemic drugs: Secondary | ICD-10-CM | POA: Insufficient documentation

## 2021-02-03 DIAGNOSIS — L089 Local infection of the skin and subcutaneous tissue, unspecified: Secondary | ICD-10-CM | POA: Diagnosis not present

## 2021-02-03 HISTORY — DX: Arthropathic psoriasis, unspecified: L40.50

## 2021-02-03 HISTORY — DX: Personal history of urinary calculi: Z87.442

## 2021-02-03 HISTORY — PX: RECTAL EXAM UNDER ANESTHESIA: SHX6399

## 2021-02-03 HISTORY — PX: INCISION AND DRAINAGE ABSCESS: SHX5864

## 2021-02-03 HISTORY — DX: Anal fistula, unspecified: K60.30

## 2021-02-03 HISTORY — DX: Personal history of other diseases of the digestive system: Z87.19

## 2021-02-03 HISTORY — DX: Anal fistula: K60.3

## 2021-02-03 LAB — POCT I-STAT, CHEM 8
BUN: 24 mg/dL — ABNORMAL HIGH (ref 8–23)
Calcium, Ion: 1.24 mmol/L (ref 1.15–1.40)
Chloride: 106 mmol/L (ref 98–111)
Creatinine, Ser: 0.8 mg/dL (ref 0.61–1.24)
Glucose, Bld: 167 mg/dL — ABNORMAL HIGH (ref 70–99)
HCT: 53 % — ABNORMAL HIGH (ref 39.0–52.0)
Hemoglobin: 18 g/dL — ABNORMAL HIGH (ref 13.0–17.0)
Potassium: 4.4 mmol/L (ref 3.5–5.1)
Sodium: 141 mmol/L (ref 135–145)
TCO2: 23 mmol/L (ref 22–32)

## 2021-02-03 LAB — GLUCOSE, CAPILLARY: Glucose-Capillary: 170 mg/dL — ABNORMAL HIGH (ref 70–99)

## 2021-02-03 SURGERY — INCISION AND DRAINAGE, ABSCESS
Anesthesia: General | Site: Rectum

## 2021-02-03 MED ORDER — ACETAMINOPHEN 500 MG PO TABS
1000.0000 mg | ORAL_TABLET | ORAL | Status: AC
  Administered 2021-02-03: 1000 mg via ORAL

## 2021-02-03 MED ORDER — ONDANSETRON HCL 4 MG/2ML IJ SOLN: 4.0000 mg | Freq: Once | INTRAMUSCULAR | Status: DC | PRN

## 2021-02-03 MED ORDER — ROCURONIUM BROMIDE 10 MG/ML (PF) SYRINGE
PREFILLED_SYRINGE | INTRAVENOUS | Status: DC | PRN
  Administered 2021-02-03: 50 mg via INTRAVENOUS

## 2021-02-03 MED ORDER — BUPIVACAINE LIPOSOME 1.3 % IJ SUSP: 20.0000 mL | Freq: Once | INTRAMUSCULAR | Status: DC

## 2021-02-03 MED ORDER — BUPIVACAINE-EPINEPHRINE 0.25% -1:200000 IJ SOLN
INTRAMUSCULAR | Status: DC | PRN
  Administered 2021-02-03: 30 mL

## 2021-02-03 MED ORDER — PROPOFOL 10 MG/ML IV BOLUS
INTRAVENOUS | Status: AC
  Filled 2021-02-03: qty 20

## 2021-02-03 MED ORDER — FENTANYL CITRATE (PF) 100 MCG/2ML IJ SOLN
INTRAMUSCULAR | Status: AC
  Filled 2021-02-03: qty 2

## 2021-02-03 MED ORDER — MIDAZOLAM HCL 2 MG/2ML IJ SOLN
INTRAMUSCULAR | Status: DC | PRN
  Administered 2021-02-03: 2 mg via INTRAVENOUS

## 2021-02-03 MED ORDER — TRAMADOL HCL 50 MG PO TABS: 50.0000 mg | ORAL_TABLET | Freq: Four times a day (QID) | ORAL | 0 refills | Status: AC | PRN

## 2021-02-03 MED ORDER — LACTATED RINGERS IV SOLN: INTRAVENOUS | Status: DC

## 2021-02-03 MED ORDER — ROCURONIUM BROMIDE 10 MG/ML (PF) SYRINGE
PREFILLED_SYRINGE | INTRAVENOUS | Status: AC
  Filled 2021-02-03: qty 10

## 2021-02-03 MED ORDER — AMISULPRIDE (ANTIEMETIC) 5 MG/2ML IV SOLN: 10.0000 mg | Freq: Once | INTRAVENOUS | Status: DC | PRN

## 2021-02-03 MED ORDER — MIDAZOLAM HCL 2 MG/2ML IJ SOLN
INTRAMUSCULAR | Status: AC
  Filled 2021-02-03: qty 2

## 2021-02-03 MED ORDER — ACETAMINOPHEN 500 MG PO TABS
ORAL_TABLET | ORAL | Status: AC
  Filled 2021-02-03: qty 2

## 2021-02-03 MED ORDER — FENTANYL CITRATE (PF) 100 MCG/2ML IJ SOLN
INTRAMUSCULAR | Status: DC | PRN
  Administered 2021-02-03 (×3): 50 ug via INTRAVENOUS

## 2021-02-03 MED ORDER — LIDOCAINE HCL (PF) 2 % IJ SOLN
INTRAMUSCULAR | Status: AC
  Filled 2021-02-03: qty 5

## 2021-02-03 MED ORDER — LIDOCAINE 2% (20 MG/ML) 5 ML SYRINGE
INTRAMUSCULAR | Status: DC | PRN
  Administered 2021-02-03: 60 mg via INTRAVENOUS

## 2021-02-03 MED ORDER — FENTANYL CITRATE (PF) 100 MCG/2ML IJ SOLN: 25.0000 ug | INTRAMUSCULAR | Status: DC | PRN

## 2021-02-03 MED ORDER — PROPOFOL 10 MG/ML IV BOLUS
INTRAVENOUS | Status: DC | PRN
  Administered 2021-02-03: 200 mg via INTRAVENOUS

## 2021-02-03 MED ORDER — BUPIVACAINE LIPOSOME 1.3 % IJ SUSP
INTRAMUSCULAR | Status: DC | PRN
  Administered 2021-02-03: 20 mL

## 2021-02-03 MED ORDER — 0.9 % SODIUM CHLORIDE (POUR BTL) OPTIME
TOPICAL | Status: DC | PRN
  Administered 2021-02-03: 1000 mL

## 2021-02-03 MED ORDER — SUGAMMADEX SODIUM 200 MG/2ML IV SOLN
INTRAVENOUS | Status: DC | PRN
  Administered 2021-02-03: 200 mg via INTRAVENOUS

## 2021-02-03 MED ORDER — KETOROLAC TROMETHAMINE 15 MG/ML IJ SOLN: 15.0000 mg | Freq: Once | INTRAMUSCULAR | Status: DC | PRN

## 2021-02-03 MED ORDER — ONDANSETRON HCL 4 MG/2ML IJ SOLN
INTRAMUSCULAR | Status: DC | PRN
  Administered 2021-02-03: 4 mg via INTRAVENOUS

## 2021-02-03 SURGICAL SUPPLY — 64 items
APL SKNCLS STERI-STRIP NONHPOA (GAUZE/BANDAGES/DRESSINGS) ×6
BENZOIN TINCTURE PRP APPL 2/3 (GAUZE/BANDAGES/DRESSINGS) ×8 IMPLANT
BLADE EXTENDED COATED 6.5IN (ELECTRODE) IMPLANT
BLADE HEX COATED 2.75 (ELECTRODE) IMPLANT
BLADE SURG 10 STRL SS (BLADE) IMPLANT
BLADE SURG 15 STRL LF DISP TIS (BLADE) ×3 IMPLANT
BLADE SURG 15 STRL SS (BLADE) ×4
COVER BACK TABLE 60X90IN (DRAPES) ×4 IMPLANT
COVER MAYO STAND STRL (DRAPES) ×4 IMPLANT
DECANTER SPIKE VIAL GLASS SM (MISCELLANEOUS) ×4 IMPLANT
DRAPE HYSTEROSCOPY (MISCELLANEOUS) IMPLANT
DRAPE LAPAROTOMY 100X72 PEDS (DRAPES) ×4 IMPLANT
DRAPE SHEET LG 3/4 BI-LAMINATE (DRAPES) IMPLANT
DRAPE UTILITY XL STRL (DRAPES) ×4 IMPLANT
DRSG PAD ABDOMINAL 8X10 ST (GAUZE/BANDAGES/DRESSINGS) ×4 IMPLANT
GAUZE 4X4 16PLY ~~LOC~~+RFID DBL (SPONGE) ×4 IMPLANT
GAUZE SPONGE 4X4 12PLY STRL (GAUZE/BANDAGES/DRESSINGS) ×4 IMPLANT
GLOVE SRG 8 PF TXTR STRL LF DI (GLOVE) ×3 IMPLANT
GLOVE SURG ENC MOIS LTX SZ7.5 (GLOVE) ×4 IMPLANT
GLOVE SURG UNDER POLY LF SZ8 (GLOVE) ×4
GOWN STRL REUS W/TWL LRG LVL3 (GOWN DISPOSABLE) ×4 IMPLANT
HYDROGEN PEROXIDE 16OZ (MISCELLANEOUS) IMPLANT
IV CATH 14GX2 1/4 (CATHETERS) IMPLANT
IV CATH 18G SAFETY (IV SOLUTION) IMPLANT
KIT SIGMOIDOSCOPE (SET/KITS/TRAYS/PACK) IMPLANT
KIT TURNOVER CYSTO (KITS) ×4 IMPLANT
LEGGING LITHOTOMY PAIR STRL (DRAPES) IMPLANT
LOOP VESSEL MAXI BLUE (MISCELLANEOUS) ×4 IMPLANT
NDL SAFETY ECLIPSE 18X1.5 (NEEDLE) IMPLANT
NEEDLE HYPO 18GX1.5 SHARP (NEEDLE)
NEEDLE HYPO 22GX1.5 SAFETY (NEEDLE) ×4 IMPLANT
NEEDLE HYPO 25X1 1.5 SAFETY (NEEDLE) IMPLANT
NS IRRIG 500ML POUR BTL (IV SOLUTION) ×4 IMPLANT
PACK BASIN DAY SURGERY FS (CUSTOM PROCEDURE TRAY) ×4 IMPLANT
PANTS MESH DISP LRG (UNDERPADS AND DIAPERS) ×3 IMPLANT
PANTS MESH DISPOSABLE L (UNDERPADS AND DIAPERS) ×1
PENCIL SMOKE EVACUATOR (MISCELLANEOUS) ×4 IMPLANT
SPONGE HEMORRHOID 8X3CM (HEMOSTASIS) IMPLANT
SPONGE SURGIFOAM ABS GEL 12-7 (HEMOSTASIS) IMPLANT
SUCTION FRAZIER HANDLE 10FR (MISCELLANEOUS)
SUCTION TUBE FRAZIER 10FR DISP (MISCELLANEOUS) IMPLANT
SUT CHROMIC 2 0 SH (SUTURE) IMPLANT
SUT CHROMIC 3 0 SH 27 (SUTURE) IMPLANT
SUT MNCRL AB 4-0 PS2 18 (SUTURE) IMPLANT
SUT SILK 0 PSL NDL (SUTURE) IMPLANT
SUT SILK 0 TIES 10X30 (SUTURE) ×4 IMPLANT
SUT SILK 2 0 (SUTURE)
SUT SILK 2-0 18XBRD TIE 12 (SUTURE) IMPLANT
SUT VIC AB 2-0 SH 27 (SUTURE) ×4
SUT VIC AB 2-0 SH 27XBRD (SUTURE) ×3 IMPLANT
SUT VIC AB 3-0 SH 18 (SUTURE) IMPLANT
SUT VIC AB 3-0 SH 27 (SUTURE)
SUT VIC AB 3-0 SH 27X BRD (SUTURE) IMPLANT
SUT VIC AB 3-0 SH 27XBRD (SUTURE) IMPLANT
SUT VIC AB 4-0 P-3 18XBRD (SUTURE) IMPLANT
SUT VIC AB 4-0 P3 18 (SUTURE)
SYR 20ML LL LF (SYRINGE) IMPLANT
SYR BULB IRRIG 60ML STRL (SYRINGE) ×4 IMPLANT
SYR CONTROL 10ML LL (SYRINGE) ×4 IMPLANT
SYR TB 1ML LL NO SAFETY (SYRINGE) IMPLANT
TOWEL OR 17X26 10 PK STRL BLUE (TOWEL DISPOSABLE) ×4 IMPLANT
TRAY DSU PREP LF (CUSTOM PROCEDURE TRAY) ×4 IMPLANT
TUBE CONNECTING 12X1/4 (SUCTIONS) ×4 IMPLANT
YANKAUER SUCT BULB TIP NO VENT (SUCTIONS) ×4 IMPLANT

## 2021-02-03 NOTE — H&P (Signed)
CC: Here today for surgery  HPI: Joshua [name] is an 65 y.o. male with history of psoriatic arthritis (on Enbrel), HTN, HLD, DM, bladder ca (2010), whom is seen today in the office at the request of Dr. Carlean [name] for evaluation of possible anal fistula.   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  He reports he had an approximate 2 to 3-year history of perianal pain that is intermittent followed by drainage. This has been an ongoing issue. This all began around the time when COVID began. He does report a personal history of diverticulitis with multiple prior attacks of left lower quadrant pain but is not interested in having any sort of surgery for that at this time. He has a daughter who has been diagnosed with Crohn's disease and she is on Humira. He himself has a history of psoriatic arthritis and is on Enbrel. If he comes off this for more than 3 weeks, he begins to have severe joint pain.   PMH: HTN, HLD, DM, bladder ca (2010)  PSH: He denies any prior abdominal surgical history. He denies any history of anal abscess drainage procedures.  FHx: +FHx of Crohn's disease - his daughter. Denies any known family history of colorectal, breast, endometrial or ovarian cancer  Social Hx: Denies use of tobacco/drugs. He is happily retired. He previously worked for [employer] at [employer] in [place] before it shut down. 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  Past Medical History:  Diagnosis Date   Anal fistula    History of bladder cancer 2009   urologist-- dr Joshua [name]---  s/p TURBT 2010,  recurrence in office 2014 ,  none since per pt   History of colonic polyps    History of COVID-19 04/2019   per pt had covid pneumonia recovered at home, no oxygen, symptoms resolved   History of diverticulitis of colon    History of hypertension 04/05/2009   per the pt he lost 55 lbs and htn has  been reversed   History of kidney stones    Hyperlipidemia 04/05/2009   pt states he does not take med for cholesterol   OA (osteoarthritis) 04/05/2009   Psoriatic arthritis ([place])    rheumonotologist--- dr Joshua [name]   Type 2 diabetes mellitus ([place]) 04/05/2009   followed by pcp---  (01-31-2021  per pt only checks blood sugar once weekly, last fasting sugar-- 122)    Past Surgical History:  Procedure Laterality Date   COLONOSCOPY     last one 11-04-2020  by Joshua [name]   [place] LITHOTRIPSY  2015   TONSILLECTOMY     child   TRANSURETHRAL RESECTION OF BLADDER TUMOR  02/2008    Family History  Problem Relation Age of Onset   Diabetes Mother    Hypertension Mother    Arthritis Father    Crohn's disease Daughter    Esophageal cancer Neg Hx    Colon cancer Neg Hx    Pancreatic cancer Neg Hx    Stomach cancer Neg Hx     Social:  reports that he has been smoking cigarettes and cigars. He has never used smokeless tobacco. He reports that he does not currently use alcohol. He reports that he does not use drugs.  Allergies:  Allergies  Allergen Reactions   Penicillins Hives    And fever blisters    Medications: I have reviewed the patient's current medications.  Results for orders placed or performed during the hospital encounter of 02/03/21 (from the past 48 hour(s))  I-STAT, chem 8     Status: Abnormal   Collection Time: 02/03/21  8:48 AM  Result Value Ref Range   Sodium 141 135 - 145 mmol/L   Potassium 4.4 3.5 - 5.1 mmol/L   Chloride 106 98 - 111 mmol/L   BUN 24 (H) 8 - 23 mg/dL   Creatinine, Ser 0.80 0.61 - 1.24 mg/dL   Glucose, Bld 167 (H) 70 - 99 mg/dL    Comment: Glucose reference range applies only to samples taken after fasting for at least 8 hours.   Calcium, Ion 1.24 1.15 - 1.40 mmol/L   TCO2 23 22 - 32 mmol/L   Hemoglobin 18.0 (H) 13.0 - 17.0 g/dL   HCT 53.0 (H) 39.0 - 52.0 %    No results found.  ROS - all of the below systems have been  reviewed with the patient and positives are indicated with bold text General: chills, fever or night sweats Eyes: blurry vision or double vision ENT: epistaxis or sore throat Allergy/Immunology: itchy/watery eyes or nasal congestion Hematologic/Lymphatic: bleeding problems, blood clots or swollen lymph nodes Endocrine: temperature intolerance or unexpected weight changes Breast: new or changing breast lumps or nipple discharge Resp: cough, shortness of breath, or wheezing CV: chest pain or dyspnea on exertion GI: as per HPI GU: dysuria, trouble voiding, or hematuria MSK: joint pain or joint stiffness Neuro: TIA or stroke symptoms Derm: pruritus and skin lesion changes Psych: anxiety and depression  PE Blood pressure (!) 154/84, pulse 77, temperature 97.8 F (36.6 C), temperature source Oral, resp. rate 16, height '5\' 8"'$  (1.727 m), weight 90.5 kg, SpO2 100 %. Constitutional: NAD; conversant Eyes: Moist conjunctiva; no lid lag; anicteric Lungs: Normal respiratory effort CV: RRR GI: Abd soft, NT; no palpable hepatosplenomegaly MSK: Normal range of motion of extremities Psychiatric: Appropriate affect; alert and oriented x3  Results for orders placed or performed during the hospital encounter of 02/03/21 (from the past 48 hour(s))  I-STAT, chem 8     Status: Abnormal   Collection Time: 02/03/21  8:48 AM  Result Value Ref Range   Sodium 141 135 - 145 mmol/L   Potassium 4.4 3.5 - 5.1 mmol/L   Chloride 106 98 - 111 mmol/L   BUN 24 (H) 8 - 23 mg/dL   Creatinine, Ser 0.80 0.61 - 1.24 mg/dL   Glucose, Bld 167 (H) 70 - 99 mg/dL    Comment: Glucose reference range applies only to samples taken after fasting for at least 8 hours.   Calcium, Ion 1.24 1.15 - 1.40 mmol/L   TCO2 23 22 - 32 mmol/L   Hemoglobin 18.0 (H) 13.0 - 17.0 g/dL   HCT 53.0 (H) 39.0 - 52.0 %    No results found.   A/P: Mr. [name] is a very pleasant 10yoM with hx of HTN, HLD, DM, bladder ca (2010), psoriatic  arthritis (on Enbrel) - here for evaluation of anal fistula, left anterior. He also has constellation of perianal findings that I am suspicious of potentially representing Crohn's disease. Given his personal history of psoriatic arthritis and having a daughter with Crohn's disease, this certainly increases the potential for this issue  -The anatomy and physiology of the anal canal was discussed with the patient with associated pictures. The pathophysiology of anal abscess and fistula was discussed as well. -We have reviewed options going forward including further observation vs surgery -we discussed incision/drainage  of perianal fluid collections, interrogation of tracks to delineate fistula. Biopsies of perianal tissue to further evaluate for potential Crohn's disease. Placement of draining seton(s) if proven to have fistula.  -We discussed if he does have perianal Crohn's disease, the setons may remain in place for some time. This would likely end up requiring a multidisciplinary approach with his rheumatologist to find the best treatment plans for him from a medical perspective. -The planned procedure, material risks (including, but not limited to, pain, bleeding, infection, scarring, need for blood transfusion, damage to anal sphincter, incontinence of gas and/or stool, need for additional procedures, recurrence, pneumonia, heart attack, stroke, death) benefits and alternatives to surgery were discussed at length. I noted a good probability that the procedure would help improve their symptoms. The patient's questions were answered to his satisfaction, he voiced understanding and elected to proceed with surgery. Additionally, we discussed typical postoperative expectations and the recovery process.  Nadeen Landau, MD [facility] Use [email or web address] to contact on call provider

## 2021-02-03 NOTE — Transfer of Care (Signed)
Immediate Anesthesia Transfer of Care Note  Patient: Joshua [name]  Procedure(s) Performed: Procedure(s) (LRB): EXCISION OF PERIANAL AND PREINEAL HYDRADENITIS, INTERROGATION OF PERIANAL WOUNDS (N/A) ANORECTAL EXAM UNDER ANESTHESIA  Patient Location: PACU  Anesthesia Type: General  Level of Consciousness: awake, oriented, sedated and patient cooperative  Airway & Oxygen Therapy: Patient Spontanous Breathing and Patient connected to face mask oxygen  Post-op Assessment: Report given to PACU RN and Post -op Vital signs reviewed and stable  Post vital signs: Reviewed and stable  Complications: No apparent anesthesia complications Last Vitals:  Vitals Value Taken Time  BP 158/77 02/03/21 1117  Temp    Pulse 90 02/03/21 1121  Resp 20 02/03/21 1120  SpO2 97 % 02/03/21 1121  Vitals shown include unvalidated device data.  Last Pain:  Vitals:   02/03/21 0835  TempSrc: Oral  PainSc: 2       Patients Stated Pain Goal: 5 ([id number])  Complications: No notable events documented.

## 2021-02-03 NOTE — Anesthesia Postprocedure Evaluation (Signed)
Anesthesia Post Note  Patient: Joshua [name]  Procedure(s) Performed: EXCISION OF PERIANAL AND PERINEAL HYDRADENITIS, INTERROGATION OF PERIANAL WOUNDS (Rectum) ANORECTAL EXAM UNDER ANESTHESIA (Rectum)     Patient location during evaluation: PACU Anesthesia Type: General Level of consciousness: awake Pain management: pain level controlled Vital Signs Assessment: post-procedure vital signs reviewed and stable Respiratory status: spontaneous breathing, nonlabored ventilation, respiratory function stable and patient connected to nasal cannula oxygen Cardiovascular status: blood pressure returned to baseline and stable Postop Assessment: no apparent nausea or vomiting Anesthetic complications: no   No notable events documented.  Last Vitals:  Vitals:   02/03/21 1145 02/03/21 1248  BP: (!) 143/73 124/73  Pulse: 82 (!) 59  Resp: 15 16  Temp: 36.8 C 36.5 C  SpO2: 99% 98%    Last Pain:  Vitals:   02/03/21 1248  TempSrc:   PainSc: 0-No pain                 Yidel Teuscher P Jasher Barkan

## 2021-02-03 NOTE — Discharge Instructions (Addendum)
ANORECTAL SURGERY: POST OP INSTRUCTIONS  DIET: Follow a light bland diet the first 24 hours after arrival home, such as soup, liquids, crackers, etc.  Be sure to include lots of fluids daily.  Avoid fast food or heavy meals as your are more likely to get nauseated.  Eat a low fat diet the next few days after surgery.   Some bleeding with bowel movements is expected for the first couple of days but this should stop in between bowel movements  Take your usually prescribed home medications unless otherwise directed.  PAIN CONTROL: It is helpful to take an over-the-counter pain medication regularly for the first few days/weeks.  Choose from the following that works best for you: Ibuprofen (Advil, etc) Three 200mg tabs every 6 hours as needed. Acetaminophen (Tylenol, etc) 500-650mg every 6 hours as needed NOTE: You may take both of these medications together - most patients find it most helpful when alternating between the two (i.e. Ibuprofen at 6am, tylenol at 9am, ibuprofen at 12pm ...) A  prescription for pain medication may have been prescribed for you at discharge.  Take your pain medication as prescribed.  If you are having problems/concerns with the prescription medicine, please call us for further advice.  Avoid getting constipated.  Between the surgery and the pain medications, it is common to experience some constipation.  Increasing fluid intake (64oz of water per day) and taking a fiber supplement (such as Metamucil, Citrucel, FiberCon) 1-2 times a day regularly will usually help prevent this problem from occurring.  Take Miralax (over the counter) 1-2x/day while taking a narcotic pain medication. If no bowel movement after 48hours, you may additionally take a laxative like a bottle of Milk of Magnesia which can be purchased over the counter. Avoid enemas if possible as these are often painful.   Watch out for diarrhea.  If you have many loose bowel movements, simplify your diet to bland  foods.  Stop any stool softeners and decrease your fiber supplement. If this worsens or does not improve, please call us.  Wash / shower every day.  If you were discharged with a dressing, you may remove this the day after your surgery. You may shower normally, getting soap/water on your wound, particularly after bowel movements.  Soaking in a warm bath filled a couple inches ("Sitz bath") is a great way to clean the area after a bowel movement and many patients find it is a way to soothe the area.  ACTIVITIES as tolerated:   You may resume regular (light) daily activities beginning the next day--such as daily self-care, walking, climbing stairs--gradually increasing activities as tolerated.  If you can walk 30 minutes without difficulty, it is safe to try more intense activity such as jogging, treadmill, bicycling, low-impact aerobics, etc. Refrain from any heavy lifting or straining for the first 2 weeks after your procedure, particularly if your surgery was for hemorrhoids. Avoid activities that make your pain worse You may drive when you are no longer taking prescription pain medication, you can comfortably wear a seatbelt, and you can safely maneuver your car and apply brakes.  FOLLOW UP in our office Please call [facility] at [phone] to set up an appointment to see your surgeon in the office for a follow-up appointment approximately 2 weeks after your surgery. Make sure that you call for this appointment the day you arrive home to insure a convenient appointment time.  9. If you have disability or family leave forms that need to be completed,   you may have them completed by your primary care physician's office; for return to work instructions, please ask our office staff and they will be happy to assist you in obtaining this documentation   When to call us [phone]: Poor pain control Reactions / problems with new medications (rash/itching, etc)  Fever over 101.5 F (38.5 C) Inability  to urinate Nausea/vomiting Worsening swelling or bruising Continued bleeding from incision. Increased pain, redness, or drainage from the incision  The clinic staff is available to answer your questions during regular business hours (8:30am-5pm).  Please don't hesitate to call and ask to speak to one of our nurses for clinical concerns.   A surgeon from [facility] is always on call at the hospitals   If you have a medical emergency, go to the nearest emergency room or call 911.   [facility], [place], [place], [place], [place]  [address] ? MAIN: [phone] FAX [phone] [email or web address]  Information for Discharge Teaching: EXPAREL (bupivacaine liposome injectable suspension)   Your surgeon or anesthesiologist gave you EXPAREL(bupivacaine) to help control your pain after surgery.  EXPAREL is a local anesthetic that provides pain relief by numbing the tissue around the surgical site. EXPAREL is designed to release pain medication over time and can control pain for up to 72 hours. Depending on how you respond to EXPAREL, you may require less pain medication during your recovery.  Possible side effects: Temporary loss of sensation or ability to move in the area where bupivacaine was injected. Nausea, vomiting, constipation Rarely, numbness and tingling in your mouth or lips, lightheadedness, or anxiety may occur. Call your doctor right away if you think you may be experiencing any of these sensations, or if you have other questions regarding possible side effects.  Follow all other discharge instructions given to you by your surgeon or nurse. Eat a healthy diet and drink plenty of water or other fluids.  If you return to the hospital for any reason within 96 hours following the administration of EXPAREL, it is important for health care providers to know that you have received this anesthetic. A teal colored band has been  placed on your arm with the date, time and amount of EXPAREL you have received in order to alert and inform your health care providers. Please leave this armband in place for the full 96 hours following administration, and then you may remove the band.  Post Anesthesia Home Care Instructions  Activity: Get plenty of rest for the remainder of the day. A responsible individual must stay with you for 24 hours following the procedure.  For the next 24 hours, DO NOT: -Drive a car -Paediatric nurse -Drink alcoholic beverages -Take any medication unless instructed by your physician -Make any legal decisions or sign important papers.  Meals: Start with liquid foods such as gelatin or soup. Progress to regular foods as tolerated. Avoid greasy, spicy, heavy foods. If nausea and/or vomiting occur, drink only clear liquids until the nausea and/or vomiting subsides. Call your physician if vomiting continues.  Special Instructions/Symptoms: Your throat may feel dry or sore from the anesthesia or the breathing tube placed in your throat during surgery. If this causes discomfort, gargle with warm salt water. The discomfort should disappear within 24 hours.

## 2021-02-03 NOTE — Op Note (Addendum)
02/03/2021  11:14 AM  PATIENT:  Joshua [name]  65 y.o. male  Patient Care Team: [name], MD as PCP - General  PRE-OPERATIVE DIAGNOSIS:  Perianal wounds, chronic drainage  POST-OPERATIVE DIAGNOSIS:  Hidradenitis suppurativa   PROCEDURE:   Excision of hidradenitis of both perianal and perineal skin - 2 cm x 8 cm, involving skin and subcutaneous fat Interrogation of perianal wound Anorectal exam under anesthesia  SURGEON:  Surgeon(s): Ileana Roup, MD   ANESTHESIA:   local and general  SPECIMEN:  Perineal skin and hidradenitis  DISPOSITION OF SPECIMEN:  PATHOLOGY  COUNTS:  Sponge, needle, and instrument counts were reported correct x2 at conclusion.  EBL: 5 mL  Drains: None  PLAN OF CARE: Discharge to home after PACU  PATIENT DISPOSITION:  PACU - hemodynamically stable.  OR FINDINGS: Left anterior perianal wound with 2 separate openings that look more Swiss cheese in appearance and more likely hidradenitis.  Neither are very close to the anal opening.  These were interrogated by injecting dilute methylene blue.  There was communication with multiple sinuses on his perineum extending in a line from his scrotum.  Clinically this all appears most consistent with hidradenitis.  There is no evident communication with the anal canal.  The perianal portion was opened and hypergranulation tissue excised.  The perineal portion was unroofed and all chronic granulation tissue removed.  Wounds are packed with a single moist 4 x 4 gauze.  DESCRIPTION: The patient was identified in the preoperative holding area and taken to the OR. SCDs were applied. He then underwent general endotracheal anesthesia without difficulty. The patient was then rolled onto the OR table in the prone jackknife position. Pressure points were then evaluated and padded. Benzoin was applied to the buttocks and they were gently taped apart.  He was then prepped and draped in usual sterile fashion.  A  surgical timeout was performed indicating the correct patient, procedure, and positioning.  A perianal block was then created using a dilute mixture of 0.25% Marcaine with epinephrine and Exparel.  After ascertaining an appropriate level of anesthesia had been achieved, a well lubricated digital rectal exam was performed. This demonstrated no palpable masses.  A Hill-Ferguson anoscope was into the anal canal and circumferential inspection demonstrated small internal/external hemorrhoids but no evident granulation tissue within the anal canal.  He does have 2 Swiss cheese appearing openings on the left anterior portion of the perianal skin within the anal margin.  These are gently probed and there is no apparent communication with the anal canal.  This tract is then interrogated by injecting dilute methylene blue.  Extravasation of blue dye is found within the perineal area extending on an area where there is a palpable cord moving towards the scrotum.  There is no extravasation of blue dye within the anal canal to suggest any sort of perianal or perirectal fistula.  Clinically this all appears most consistent with hidradenitis.  Therefore, our attention is directed at excising the disease.  The perianal portion is opened and all hypergranulation tissue removed with a small curette.  Hemostasis achieved electrocautery.  Attention was directed at the perineal portion.  This is also unroofed by excising the overlying involved skin.  The skin is passed off as specimen.  There is a thick wall of tissue consistent with a chronic abscess.  All hypergranulation tissue was excised and the abscess cavity and cautery was used for hemostasis. Total involved tissue is 2 x 8 cm in size, involving  skin and subcutaneous fat.  Sponge, needle, and instrument counts were reported correct x2.  The wounds were rinsed and hemostasis verified.  A dressing consisting of a moist 4 x 4 gauze was placed.  This was then secured using  additional 4 x 4's and mesh underwear.  He was rolled back onto a stretcher, awakened from anesthesia, extubated, and transported to PACU in satisfactory condition.  DISPOSITION: PACU in satisfactory condition.

## 2021-02-03 NOTE — Anesthesia Procedure Notes (Signed)
Procedure Name: Intubation Date/Time: 02/03/2021 10:22 AM Performed by: Suan Halter, CRNA Pre-anesthesia Checklist: Patient identified, Emergency Drugs available, Suction available and Patient being monitored Patient Re-evaluated:Patient Re-evaluated prior to induction Oxygen Delivery Method: Circle system utilized Preoxygenation: Pre-oxygenation with 100% oxygen Induction Type: IV induction Ventilation: Mask ventilation without difficulty Laryngoscope Size: Mac and 3 Grade View: Grade III Tube type: Oral Tube size: 7.5 mm Number of attempts: 2 Airway Equipment and Method: Stylet, Oral airway and Video-laryngoscopy Placement Confirmation: ETT inserted through vocal cords under direct vision, positive ETCO2 and breath sounds checked- equal and bilateral Secured at: 22 cm Tube secured with: Tape Dental Injury: Teeth and Oropharynx as per pre-operative assessment  Comments: DL x 1 with MAC 3 and pt with anterior chin and grade 3 view. Glidescope in room and easily intubated using Mac 3 with DV of cords

## 2021-02-03 NOTE — Anesthesia Preprocedure Evaluation (Addendum)
Anesthesia Evaluation  Patient identified by MRN, date of birth, ID band Patient awake    Reviewed: Allergy & Precautions, NPO status , Patient's Chart, lab work & pertinent test results  Airway Mallampati: I  TM Distance: >3 FB Neck ROM: Full    Dental no notable dental hx.    Pulmonary Current Smoker and Patient abstained from smoking.,    Pulmonary exam normal breath sounds clear to auscultation       Cardiovascular hypertension, Normal cardiovascular exam Rhythm:Regular Rate:Normal  ECG: NSR, rate 70   Neuro/Psych negative neurological ROS  negative psych ROS   GI/Hepatic negative GI ROS, Neg liver ROS,   Endo/Other  diabetes, Oral Hypoglycemic Agents  Renal/GU negative Renal ROS     Musculoskeletal  (+) Arthritis ,   Abdominal (+) + obese,   Peds  Hematology HLD   Anesthesia Other Findings ANAL FISTULAS, POSSIBLE PERIANAL CROHNS DISEASE  Reproductive/Obstetrics                            Anesthesia Physical Anesthesia Plan  ASA: 2  Anesthesia Plan: General   Post-op Pain Management:    Induction: Intravenous  PONV Risk Score and Plan: 1 and Ondansetron, Dexamethasone, Midazolam and Treatment may vary due to age or medical condition  Airway Management Planned: Oral ETT  Additional Equipment:   Intra-op Plan:   Post-operative Plan: Extubation in OR  Informed Consent: I have reviewed the patients History and Physical, chart, labs and discussed the procedure including the risks, benefits and alternatives for the proposed anesthesia with the patient or authorized representative who has indicated his/her understanding and acceptance.     Dental advisory given  Plan Discussed with: CRNA  Anesthesia Plan Comments:         Anesthesia Quick Evaluation

## 2021-02-04 ENCOUNTER — Encounter (HOSPITAL_BASED_OUTPATIENT_CLINIC_OR_DEPARTMENT_OTHER): Payer: Self-pay | Admitting: Surgery

## 2021-02-04 LAB — SURGICAL PATHOLOGY

## 2021-03-04 DIAGNOSIS — B353 Tinea pedis: Secondary | ICD-10-CM | POA: Diagnosis not present

## 2021-03-04 DIAGNOSIS — L720 Epidermal cyst: Secondary | ICD-10-CM | POA: Diagnosis not present

## 2021-03-04 DIAGNOSIS — L821 Other seborrheic keratosis: Secondary | ICD-10-CM | POA: Diagnosis not present

## 2021-03-04 DIAGNOSIS — D2272 Melanocytic nevi of left lower limb, including hip: Secondary | ICD-10-CM | POA: Diagnosis not present

## 2021-03-04 DIAGNOSIS — L309 Dermatitis, unspecified: Secondary | ICD-10-CM | POA: Diagnosis not present

## 2021-03-04 DIAGNOSIS — L570 Actinic keratosis: Secondary | ICD-10-CM | POA: Diagnosis not present

## 2021-03-08 DIAGNOSIS — Z79899 Other long term (current) drug therapy: Secondary | ICD-10-CM | POA: Diagnosis not present

## 2021-03-08 DIAGNOSIS — L4050 Arthropathic psoriasis, unspecified: Secondary | ICD-10-CM | POA: Diagnosis not present

## 2021-03-08 DIAGNOSIS — M2550 Pain in unspecified joint: Secondary | ICD-10-CM | POA: Diagnosis not present

## 2021-03-08 DIAGNOSIS — L409 Psoriasis, unspecified: Secondary | ICD-10-CM | POA: Diagnosis not present

## 2021-03-08 DIAGNOSIS — R5383 Other fatigue: Secondary | ICD-10-CM | POA: Diagnosis not present

## 2021-03-24 DIAGNOSIS — L929 Granulomatous disorder of the skin and subcutaneous tissue, unspecified: Secondary | ICD-10-CM | POA: Diagnosis not present

## 2021-03-24 DIAGNOSIS — D485 Neoplasm of uncertain behavior of skin: Secondary | ICD-10-CM | POA: Diagnosis not present

## 2021-04-11 ENCOUNTER — Other Ambulatory Visit: Payer: Self-pay

## 2021-04-12 ENCOUNTER — Ambulatory Visit (INDEPENDENT_AMBULATORY_CARE_PROVIDER_SITE_OTHER): Payer: Medicare Other | Admitting: Family Medicine

## 2021-04-12 VITALS — BP 140/68 | HR 67 | Temp 97.7°F | Ht 68.0 in | Wt 203.7 lb

## 2021-04-12 DIAGNOSIS — E1165 Type 2 diabetes mellitus with hyperglycemia: Secondary | ICD-10-CM

## 2021-04-12 DIAGNOSIS — Z0000 Encounter for general adult medical examination without abnormal findings: Secondary | ICD-10-CM

## 2021-04-12 LAB — BASIC METABOLIC PANEL
BUN: 18 mg/dL (ref 6–23)
CO2: 26 mEq/L (ref 19–32)
Calcium: 9.3 mg/dL (ref 8.4–10.5)
Chloride: 102 mEq/L (ref 96–112)
Creatinine, Ser: 0.98 mg/dL (ref 0.40–1.50)
GFR: 80.9 mL/min (ref >60.00)
Glucose, Bld: 128 mg/dL — ABNORMAL HIGH (ref 70–99)
Potassium: 4.1 mEq/L (ref 3.5–5.1)
Sodium: 137 mEq/L (ref 135–145)

## 2021-04-12 LAB — CBC WITH DIFFERENTIAL/PLATELET
Basophils Absolute: 0.1 10*3/uL (ref 0.0–0.1)
Basophils Relative: 0.7 % (ref 0.0–3.0)
Eosinophils Absolute: 0.1 10*3/uL (ref 0.0–0.7)
Eosinophils Relative: 1.8 % (ref 0.0–5.0)
HCT: 46 % (ref 39.0–52.0)
Hemoglobin: 15.1 g/dL (ref 13.0–17.0)
Lymphocytes Relative: 17.2 % (ref 12.0–46.0)
Lymphs Abs: 1.3 10*3/uL (ref 0.7–4.0)
MCHC: 32.8 g/dL (ref 30.0–36.0)
MCV: 95.6 fl (ref 78.0–100.0)
Monocytes Absolute: 0.5 10*3/uL (ref 0.1–1.0)
Monocytes Relative: 6.5 % (ref 3.0–12.0)
Neutro Abs: 5.8 10*3/uL (ref 1.4–7.7)
Neutrophils Relative %: 73.8 % (ref 43.0–77.0)
Platelets: 301 10*3/uL (ref 150.0–400.0)
RBC: 4.82 Mil/uL (ref 4.22–5.81)
RDW: 13.2 % (ref 11.5–15.5)
WBC: 7.8 10*3/uL (ref 4.0–10.5)

## 2021-04-12 LAB — TSH: TSH: 1.7 u[IU]/mL (ref 0.35–5.50)

## 2021-04-12 LAB — LIPID PANEL
Cholesterol: 188 mg/dL (ref 0–200)
HDL: 42.1 mg/dL (ref >39.00)
LDL Cholesterol: 119 mg/dL — ABNORMAL HIGH (ref 0–99)
NonHDL: 146.14
Total CHOL/HDL Ratio: 4
Triglycerides: 136 mg/dL (ref 0.0–149.0)
VLDL: 27.2 mg/dL (ref 0.0–40.0)

## 2021-04-12 LAB — HEPATIC FUNCTION PANEL
ALT: 19 U/L (ref 0–53)
AST: 18 U/L (ref 0–37)
Albumin: 4.1 g/dL (ref 3.5–5.2)
Alkaline Phosphatase: 79 U/L (ref 39–117)
Bilirubin, Direct: 0 mg/dL (ref 0.0–0.3)
Total Bilirubin: 0.5 mg/dL (ref 0.2–1.2)
Total Protein: 8 g/dL (ref 6.0–8.3)

## 2021-04-12 NOTE — Progress Notes (Signed)
Established Patient Office Visit  Subjective:  Patient ID: Joshua [name], male    DOB: [birth year]  Age: 65 y.o. MRN: [id number]  CC:  Chief Complaint  Patient presents with   Annual Exam    HPI Joshua [name] presents for physical exam.  Past medical history significant for hypertension, type 2 diabetes, psoriatic arthritis, osteoarthritis, bladder cancer, history of perianal fistula, hyperlipidemia.  Currently only taking Jardiance for his diabetes.  He had surgery for what was diagnosed as hidradenitis related problems of his buttock area during the past year.  Still followed by urology regarding his bladder cancer history.  He is seen dermatologist regarding some fungal changes of his nails and is currently on terbinafine for that.  He is going to the gym and exercising a few days per week.  Enjoys playing golf still.  Health maintenance  -Previous hepatitis C screen negative -Tetanus due 2025 -He declines Prevnar 20, flu vaccine, Shingrix, and COVID vaccines -In process of setting up eye exam. -Colonoscopy due 6/25  Social history he retired about a year ago.  History of occasional cigar use.  He is married and has 2 daughters and 1 grandchild.  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  Family history-his mother is 29 and has hypertension and diabetes history and still lives independently.  Father deceased.  Past Medical History:  Diagnosis Date   Anal fistula    History of bladder cancer 2009   urologist-- dr Milford Cage---  s/p TURBT 2010,  recurrence in office 2014 ,  none since per pt   History of colonic polyps    History of COVID-19 04/2019   per pt had covid pneumonia recovered at home, no oxygen, symptoms resolved   History of diverticulitis of colon    History of hypertension 04/05/2009   per the pt he lost 55 lbs and htn has been reversed   History of kidney stones    Hyperlipidemia 04/05/2009   pt states he does not take med for cholesterol   OA  (osteoarthritis) 04/05/2009   Psoriatic arthritis ([place])    rheumonotologist--- dr a. Trudie Reed   Type 2 diabetes mellitus ([place]) 04/05/2009   followed by pcp---  (01-31-2021  per pt only checks blood sugar once weekly, last fasting sugar-- 122)    Past Surgical History:  Procedure Laterality Date   COLONOSCOPY     last one 11-04-2020  by gessner   EXTRACORPOREAL SHOCK WAVE LITHOTRIPSY  2015   INCISION AND DRAINAGE ABSCESS N/A 02/03/2021   Procedure: EXCISION OF PERIANAL AND PERINEAL HYDRADENITIS, INTERROGATION OF PERIANAL WOUNDS;  Surgeon: Ileana Roup, MD;  Location: [facility];  Service: General;  Laterality: N/A;   RECTAL EXAM UNDER ANESTHESIA  02/03/2021   Procedure: ANORECTAL EXAM UNDER ANESTHESIA;  Surgeon: Ileana Roup, MD;  Location: [facility];  Service: General;;   TONSILLECTOMY     child   TRANSURETHRAL RESECTION OF BLADDER TUMOR  02/2008    Family History  Problem Relation Age of Onset   Diabetes Mother    Hypertension Mother    Arthritis Father    Crohn's disease Daughter    Esophageal cancer Neg Hx    Colon cancer Neg Hx    Pancreatic cancer Neg Hx    Stomach cancer Neg Hx     Social History   Socioeconomic History   Marital status: Married    Spouse name: Not on file   Number of children: Not on file  Years of education: Not on file   Highest education level: Not on file  Occupational History   Not on file  Tobacco Use   Smoking status: Some Days    Years: 45.00    Types: Cigarettes, Cigars   Smokeless tobacco: Never   Tobacco comments:    01-31-2021  per pt quit cigarettes 1981 (age 29) for 8 yrs;  since then has smoked cigar's socially  Vaping Use   Vaping Use: Never used  Substance and Sexual Activity   Alcohol use: Not Currently    Comment: occasional   Drug use: No   Sexual activity: Not on file  Other Topics Concern   Not on file  Social History Narrative   Not on file   Social Determinants of  Health   Financial Resource Strain: Not on file  Food Insecurity: Not on file  Transportation Needs: Not on file  Physical Activity: Not on file  Stress: Not on file  Social Connections: Not on file  Intimate Partner Violence: Not on file    Outpatient Medications Prior to Visit  Medication Sig Dispense Refill   acetaminophen (TYLENOL) 325 MG tablet Take 650 mg by mouth every 6 (six) hours as needed.     BAYER MICROLET LANCETS lancets Check Blood Sugar Once Daily. 30 each 5   ENBREL SURECLICK 50 MG/ML injection 50 mg once a week. Wednesday's     hydrocortisone (ANUSOL-HC) 2.5 % rectal cream APPLY RECTALLY TO THE AFFECTED AREA TWICE DAILY (Patient taking differently: Place 1 application rectally 2 (two) times daily.) 30 g 1   ibuprofen (ADVIL) 800 MG tablet Take 800 mg by mouth 3 (three) times daily as needed.     JARDIANCE 10 MG TABS tablet TAKE 1 TABLET BY MOUTH  DAILY (Patient taking differently: Take 10 mg by mouth daily. Give in the morning with or without food.) 90 tablet 3   naproxen sodium (ANAPROX) 220 MG tablet Take 220 mg by mouth 2 (two) times daily as needed.     ONETOUCH ULTRA test strip USE AS DIRECTED TO CHECK BLOOD GLUCOSE ONE TIME DAILY 100 strip 10   No facility-administered medications prior to visit.    Allergies  Allergen Reactions   Penicillins Hives    And fever blisters    ROS Review of Systems  Constitutional:  Negative for activity change, appetite change, fatigue and fever.  HENT:  Negative for congestion, ear pain and trouble swallowing.   Eyes:  Negative for pain and visual disturbance.  Respiratory:  Negative for cough, shortness of breath and wheezing.   Cardiovascular:  Negative for chest pain and palpitations.  Gastrointestinal:  Negative for abdominal distention, abdominal pain, blood in stool, constipation, diarrhea, nausea, rectal pain and vomiting.  Endocrine: Negative for polydipsia and polyuria.  Genitourinary:  Negative for dysuria,  hematuria and testicular pain.  Musculoskeletal:  Positive for arthralgias. Negative for joint swelling.  Neurological:  Negative for dizziness, syncope and headaches.  Hematological:  Negative for adenopathy.  Psychiatric/Behavioral:  Negative for confusion and dysphoric mood.      Objective:    Physical Exam Constitutional:      General: He is not in acute distress.    Appearance: He is well-developed.  HENT:     Head: Normocephalic and atraumatic.     Right Ear: External ear normal.     Left Ear: External ear normal.  Eyes:     Conjunctiva/sclera: Conjunctivae normal.     Pupils: Pupils are equal, round, and  reactive to light.  Neck:     Thyroid: No thyromegaly.  Cardiovascular:     Rate and Rhythm: Normal rate and regular rhythm.     Heart sounds: Normal heart sounds. No murmur heard. Pulmonary:     Effort: No respiratory distress.     Breath sounds: No wheezing or rales.  Abdominal:     General: Bowel sounds are normal. There is no distension.     Palpations: Abdomen is soft. There is no mass.     Tenderness: There is no abdominal tenderness. There is no guarding or rebound.  Musculoskeletal:     Cervical back: Normal range of motion and neck supple.     Right lower leg: No edema.     Left lower leg: No edema.  Lymphadenopathy:     Cervical: No cervical adenopathy.  Skin:    Findings: No rash.     Comments: No foot lesions.  Normal sensory function to touch.  Good distal foot pulses.  Does have some fungal changes involving the right second and fourth toenails  Neurological:     Mental Status: He is alert and oriented to person, place, and time.     Cranial Nerves: No cranial nerve deficit.     Deep Tendon Reflexes: Reflexes normal.    BP 140/68 (BP Location: Left Arm, Patient Position: Sitting, Cuff Size: Normal)   Pulse 67   Temp 97.7 F (36.5 C) (Oral)   Ht 5\' 8"  (1.727 m)   Wt 203 lb 11.2 oz (92.4 kg)   SpO2 99%   BMI 30.97 kg/m  Wt Readings from Last  3 Encounters:  04/12/21 203 lb 11.2 oz (92.4 kg)  02/03/21 199 lb 9.6 oz (90.5 kg)  11/04/20 195 lb (88.5 kg)     Health Maintenance Due  Topic Date Due   HIV Screening  Never done   HEMOGLOBIN A1C  09/10/2019   FOOT EXAM  03/11/2020   URINE MICROALBUMIN  03/11/2020   OPHTHALMOLOGY EXAM  09/01/2020    There are no preventive care reminders to display for this patient.  Lab Results  Component Value Date   TSH 2.43 03/12/2019   Lab Results  Component Value Date   WBC 4.8 05/01/2019   HGB 18.0 (H) 02/03/2021   HCT 53.0 (H) 02/03/2021   MCV 94.3 05/01/2019   PLT 154 05/01/2019   Lab Results  Component Value Date   NA 141 02/03/2021   K 4.4 02/03/2021   CO2 18 (L) 05/01/2019   GLUCOSE 167 (H) 02/03/2021   BUN 24 (H) 02/03/2021   CREATININE 0.80 02/03/2021   BILITOT 0.6 03/12/2019   ALKPHOS 81 03/12/2019   AST 18 03/12/2019   ALT 27 03/12/2019   PROT 7.8 03/12/2019   ALBUMIN 4.5 03/12/2019   CALCIUM 9.2 05/01/2019   ANIONGAP 15 05/01/2019   GFR 72.02 03/12/2019   Lab Results  Component Value Date   CHOL 212 (H) 03/12/2019   Lab Results  Component Value Date   HDL 52.60 03/12/2019   Lab Results  Component Value Date   LDLCALC 135 (H) 03/12/2019   Lab Results  Component Value Date   TRIG 124.0 03/12/2019   Lab Results  Component Value Date   CHOLHDL 4 03/12/2019   Lab Results  Component Value Date   HGBA1C 6.8 (H) 03/12/2019      Assessment & Plan:   Problem List Items Addressed This Visit       Unprioritized   Type 2 diabetes  mellitus ([place])   Relevant Orders   Microalbumin / creatinine urine ratio   Other Visit Diagnoses     Physical exam    -  Primary   Relevant Orders   Basic metabolic panel   Lipid panel   CBC with Differential/Platelet   TSH   Hepatic function panel   Hemoglobin A1c      Offered vaccines including influenza, Shingrix, Prevnar 20 but he declines each of these.  Obtain screening labs as above.  Blood  pressure slightly up today.  We recommend close monitoring at home over the next couple weeks and be in touch if consistently greater than 140/90.  Would have low threshold to consider ARB or ACE inhibitor if remains up  No orders of the defined types were placed in this encounter.   Follow-up: Return in about 3 months (around 07/13/2021).    Carolann Littler, MD

## 2021-04-12 NOTE — Patient Instructions (Signed)
Monitor blood pressure and be in touch if consistently > 140/90.   

## 2021-05-19 DIAGNOSIS — L732 Hidradenitis suppurativa: Secondary | ICD-10-CM | POA: Diagnosis not present

## 2021-06-14 DIAGNOSIS — L732 Hidradenitis suppurativa: Secondary | ICD-10-CM | POA: Diagnosis not present

## 2021-06-14 DIAGNOSIS — L400 Psoriasis vulgaris: Secondary | ICD-10-CM | POA: Diagnosis not present

## 2021-06-20 ENCOUNTER — Ambulatory Visit (INDEPENDENT_AMBULATORY_CARE_PROVIDER_SITE_OTHER): Payer: Medicare Other | Admitting: Physician Assistant

## 2021-06-20 ENCOUNTER — Encounter: Payer: Self-pay | Admitting: Physician Assistant

## 2021-06-20 ENCOUNTER — Other Ambulatory Visit: Payer: Self-pay

## 2021-06-20 VITALS — BP 122/70 | HR 68 | Temp 97.3°F | Ht 68.0 in | Wt 204.2 lb

## 2021-06-20 DIAGNOSIS — G510 Bell's palsy: Secondary | ICD-10-CM

## 2021-06-20 DIAGNOSIS — E1165 Type 2 diabetes mellitus with hyperglycemia: Secondary | ICD-10-CM | POA: Diagnosis not present

## 2021-06-20 MED ORDER — PREDNISONE 20 MG PO TABS: 60.0000 mg | ORAL_TABLET | Freq: Every day | ORAL | 0 refills | Status: AC

## 2021-06-20 MED ORDER — VALACYCLOVIR HCL 1 G PO TABS: 1000.0000 mg | ORAL_TABLET | Freq: Three times a day (TID) | ORAL | 0 refills | Status: AC

## 2021-06-20 NOTE — Progress Notes (Signed)
Joshua [name] is a 66 y.o. male here for muscle loss   History of Present Illness:   Chief Complaint  Patient presents with   Muscle loss    Pt is c/o muscle loss on right side of face since Friday. He can not smile on right side of mouth, right eye having trouble blinking. Slight headache this AM on right side.   Joshua [name] presented to today's visit with his wife, [name].   HPI  Facial Muscle Loss Joshua [name] presents with c/o muscle loss on the right side of his face that has been onset for 3 days. States that upon waking up he felt as though the ride side of his lips were numb. As the day went on he noticed that he had trouble sucking up liquid through a straw, which caused him a bit of concern. After googling his sx, he believes that he could have Bell's Palsy. Over the past couple of days, he has been experiencing increased tearing in his right eye as well as blurry vision upon waking up which resolves after washing his face. Pt reports that sx were slowly  becoming worse before plateauing. He did have a slight headache but this was very mild per patient report and has resolved. Denies pain upon eye movement, double vision, chest pain, SOB, worst HA of life, HA w/ thunderclap onset, LE swelling.  Patient's wife denies patient with any confusion, slurred speech, difficulty walking, changes in gait.  Type 2 DM Patient does have a history of type 2 diabetes.  This is overall well controlled with his diet.  His last A1c was 6.8% in 2020.  He checks his blood sugars regularly.  He is not currently on any medications for his diabetes.    Past Medical History:  Diagnosis Date   Anal fistula    History of bladder cancer 2009   urologist-- dr Joshua [name]---  s/p TURBT 2010,  recurrence in office 2014 ,  none since per pt   History of colonic polyps    History of COVID-19 04/2019   per pt had covid pneumonia recovered at home, no oxygen, symptoms resolved   History of diverticulitis of colon    History  of hypertension 04/05/2009   per the pt he lost 55 lbs and htn has been reversed   History of kidney stones    Hyperlipidemia 04/05/2009   pt states he does not take med for cholesterol   OA (osteoarthritis) 04/05/2009   Psoriatic arthritis ([place])    rheumonotologist--- dr aTrudie Reed   Type 2 diabetes mellitus ([place]) 04/05/2009   followed by pcp---  (01-31-2021  per pt only checks blood sugar once weekly, last fasting sugar-- 122)     Social History   Tobacco Use   Smoking status: Some Days    Years: 45.00    Types: Cigarettes, Cigars   Smokeless tobacco: Never   Tobacco comments:    01-31-2021  per pt quit cigarettes 1981 (age 58) for 8 yrs;  since then has smoked cigar's socially  Vaping Use   Vaping Use: Never used  Substance Use Topics   Alcohol use: Not Currently    Comment: occasional   Drug use: No    Past Surgical History:  Procedure Laterality Date   COLONOSCOPY     last one 11-04-2020  by gessner   EXTRACORPOREAL SHOCK WAVE LITHOTRIPSY  2015   INCISION AND DRAINAGE ABSCESS N/A 02/03/2021   Procedure: EXCISION OF PERIANAL AND PERINEAL HYDRADENITIS, INTERROGATION OF PERIANAL WOUNDS;  Surgeon: Joshua Roup, MD;  Location: [facility];  Service: General;  Laterality: N/A;   RECTAL EXAM UNDER ANESTHESIA  02/03/2021   Procedure: ANORECTAL EXAM UNDER ANESTHESIA;  Surgeon: Joshua Roup, MD;  Location: [facility];  Service: General;;   TONSILLECTOMY     child   TRANSURETHRAL RESECTION OF BLADDER TUMOR  02/2008    Family History  Problem Relation Age of Onset   Diabetes Mother    Hypertension Mother    Arthritis Father    Crohn's disease Daughter    Esophageal cancer Neg Hx    Colon cancer Neg Hx    Pancreatic cancer Neg Hx    Stomach cancer Neg Hx     Allergies  Allergen Reactions   Adalimumab Other (See Comments)   Penicillin G Other (See Comments)   Penicillins Hives    And fever blisters    Current Medications:    Current Outpatient Medications:    acetaminophen (TYLENOL) 325 MG tablet, Take 650 mg by mouth every 6 (six) hours as needed., Disp: , Rfl:    BAYER MICROLET LANCETS lancets, Check Blood Sugar Once Daily., Disp: 30 each, Rfl: 5   ENBREL SURECLICK 50 MG/ML injection, 50 mg once a week. Wednesday's, Disp: , Rfl:    hydrocortisone (ANUSOL-HC) 2.5 % rectal cream, APPLY RECTALLY TO THE AFFECTED AREA TWICE DAILY (Patient taking differently: Place 1 application rectally 2 (two) times daily.), Disp: 30 g, Rfl: 1   ibuprofen (ADVIL) 800 MG tablet, Take 800 mg by mouth 3 (three) times daily as needed., Disp: , Rfl:    JARDIANCE 10 MG TABS tablet, TAKE 1 TABLET BY MOUTH  DAILY (Patient taking differently: Take 10 mg by mouth daily. Give in the morning with or without food.), Disp: 90 tablet, Rfl: 3   naproxen sodium (ANAPROX) 220 MG tablet, Take 220 mg by mouth 2 (two) times daily as needed., Disp: , Rfl:    ONETOUCH ULTRA test strip, USE AS DIRECTED TO CHECK BLOOD GLUCOSE ONE TIME DAILY, Disp: 100 strip, Rfl: 10   clindamycin (CLEOCIN T) 1 % lotion, Apply topically daily., Disp: , Rfl:    Review of Systems:   ROS Negative unless otherwise specified per HPI. Vitals:   Vitals:   06/20/21 1003  BP: 122/70  Pulse: 68  Temp: (!) 97.3 F (36.3 C)  TempSrc: Temporal  SpO2: 97%  Weight: 204 lb 4 oz (92.6 kg)  Height: 5\' 8"  (1.727 m)     Body mass index is 31.06 kg/m.  Physical Exam:   Physical Exam Vitals and nursing note reviewed.  Constitutional:      General: He is not in acute distress.    Appearance: He is well-developed. He is not ill-appearing or toxic-appearing.  HENT:     Head: Normocephalic and atraumatic.  Eyes:     General: Vision grossly intact. Gaze aligned appropriately.     Extraocular Movements: Extraocular movements intact.  Cardiovascular:     Rate and Rhythm: Normal rate and regular rhythm.     Pulses: Normal pulses.     Heart sounds: Normal heart sounds, S1 normal  and S2 normal.  Pulmonary:     Effort: Pulmonary effort is normal.     Breath sounds: Normal breath sounds.  Skin:    General: Skin is warm and dry.  Neurological:     Mental Status: He is alert.     GCS: GCS eye subscore is 4. GCS verbal subscore is 5. GCS  motor subscore is 6.     Cranial Nerves: Facial asymmetry present.     Motor: No tremor.     Gait: Gait is intact.     Comments: R side of face: R eyebrow and eyelid sagging; inability to fully close R eyelid Inability to raise R eyebrow or raise R side of mouth  Psychiatric:        Speech: Speech normal.        Behavior: Behavior normal. Behavior is cooperative.    Assessment and Plan:   Bell's Palsy No red flags on exam Start valtrex 1000 mg three times daily x 7 days Start prednisone 60 mg daily x 7 days  Use an OTC eye lubricant and apply at night, preferably ointment Discussed taping of eyelid to keep eye moist while sleeping at night to prevent corneal abrasions Follow up if symptoms worsen or concerns occur List of worsening precautions advised and provided in written format  Type 2 DM We discussed continuing to check blood sugars regularly while on prednisone I discussed with him that his blood sugars may remain elevated while on prednisone, and to call our office if he has concerns with new or unusual symptoms, or readings consistently greater than 250 I recommended close follow-up with PCP to update A1c when he is able to  [employer] C Ratchford,acting as a scribe for [employer], PA.,have documented all relevant documentation on the behalf of Inda Coke, PA,as directed by  Inda Coke, PA while in the presence of Inda Coke, [place].  I, Inda Coke, [place], have reviewed all documentation for this visit. The documentation on 06/20/21 for the exam, diagnosis, procedures, and orders are all accurate and complete.  Time spent with patient today was 25 minutes which consisted of chart review, discussing  diagnosis, work up, treatment answering questions and documentation.    Inda Coke, PA-C

## 2021-06-20 NOTE — Patient Instructions (Addendum)
It was great to see you!  Start oral prednisone 60 mg daily x 7 days  Start oral valtrex 1000 mg three times daily x 7 days  At night, lubricate eye and put tape over lid so it stays closed.  Pick up a lubricant ointment over the counter, such as:    Take care,  Inda Coke PA-C

## 2021-06-30 ENCOUNTER — Encounter: Payer: Self-pay | Admitting: Internal Medicine

## 2021-06-30 ENCOUNTER — Ambulatory Visit: Payer: Medicare Other | Admitting: Internal Medicine

## 2021-06-30 VITALS — BP 130/70 | HR 84 | Ht 68.0 in | Wt 201.0 lb

## 2021-06-30 DIAGNOSIS — K603 Anal fistula: Secondary | ICD-10-CM

## 2021-06-30 DIAGNOSIS — L732 Hidradenitis suppurativa: Secondary | ICD-10-CM | POA: Diagnosis not present

## 2021-06-30 NOTE — Progress Notes (Signed)
Joshua [name] 65 y.o. 08-21-1955 [id number]  Assessment & Plan:   Encounter Diagnoses  Name Primary?   Perianal fistula Yes   Hidradenitis     I explained that I cannot really help him with his perianal problems.  The hidradenitis needs different treatment it seems.  I agree with Joshua. Dema [name] on this.  I have recommended he see his rheumatologist Joshua [name] and discuss additional therapy.  Perhaps Remicade would be a good new therapy.  I will continue to see him for polyp surveillance.  CC: Joshua Post, MD Joshua [name] Joshua [name]   Subjective:   Chief Complaint: Perianal fistula, hidradenitis  HPI 66 year old man who had had recurrent perianal bleeding and a "bump" in the perianal area.  It was elusive as far as a diagnosis.  Colonoscopy was unrevealing last performed June 2022 where he had diminutive SSP, no residual adenoma at cecal adenoma resection site, and left colon stenosis stricture.  An MRI was performed when he was symptomatic again and demonstrated a perianal fistula.  He saw Joshua. Annye [name].  Joshua. Dema [name] ended up making a diagnosis of hidradenitis.  The patient had an excision procedure in September.  He had a follow-up in December where Joshua. Dema [name] suggested he be considered for change in biologic therapy which he takes for psoriasis, psoriatic arthritis.  He saw his dermatologist who recommended he follow-up with his rheumatologist.  Currently on Enbrel.  The patient is here for my opinion.  He continues to have some intermittent perianal bleeding and drainage problems. Allergies  Allergen Reactions   Adalimumab Other (See Comments)   Penicillin G Other (See Comments)   Penicillins Hives    And fever blisters   Current Meds  Medication Sig   acetaminophen (TYLENOL) 325 MG tablet Take 650 mg by mouth every 6 (six) hours as needed.   BAYER MICROLET LANCETS lancets Check Blood Sugar Once Daily.   clindamycin (CLEOCIN T) 1 % lotion Apply  topically daily.   ENBREL SURECLICK 50 MG/ML injection 50 mg once a week. Wednesday's   hydrocortisone (ANUSOL-HC) 2.5 % rectal cream APPLY RECTALLY TO THE AFFECTED AREA TWICE DAILY (Patient taking differently: Place 1 application rectally 2 (two) times daily.)   ibuprofen (ADVIL) 800 MG tablet Take 800 mg by mouth 3 (three) times daily as needed.   JARDIANCE 10 MG TABS tablet TAKE 1 TABLET BY MOUTH  DAILY (Patient taking differently: Take 10 mg by mouth daily. Give in the morning with or without food.)   naproxen sodium (ANAPROX) 220 MG tablet Take 220 mg by mouth 2 (two) times daily as needed.   ONETOUCH ULTRA test strip USE AS DIRECTED TO CHECK BLOOD GLUCOSE ONE TIME DAILY   Past Medical History:  Diagnosis Date   Anal fistula    Hidradenitis    History of bladder cancer 2009   urologist-- Joshua [name]---  s/p TURBT 2010,  recurrence in office 2014 ,  none since per pt   History of colonic polyps    History of COVID-19 04/2019   per pt had covid pneumonia recovered at home, no oxygen, symptoms resolved   History of diverticulitis of colon    History of hypertension 04/05/2009   per the pt he lost 55 lbs and htn has been reversed   History of kidney stones    Hyperlipidemia 04/05/2009   pt states he does not take med for cholesterol   OA (osteoarthritis) 04/05/2009   Psoriatic arthritis ([place])  rheumonotologist--- Joshua Joshua [name]   Type 2 diabetes mellitus ([place]) 04/05/2009   followed by pcp---  (01-31-2021  per pt only checks blood sugar once weekly, last fasting sugar-- 122)   Past Surgical History:  Procedure Laterality Date   COLONOSCOPY     last one 11-04-2020  by Joshua [name]   [place] LITHOTRIPSY  2015   INCISION AND DRAINAGE ABSCESS N/A 02/03/2021   Procedure: EXCISION OF PERIANAL AND PERINEAL HYDRADENITIS, INTERROGATION OF PERIANAL WOUNDS;  Surgeon: Joshua Roup, MD;  Location: [place];  Service: General;  Laterality: N/A;   RECTAL EXAM  UNDER ANESTHESIA  02/03/2021   Procedure: ANORECTAL EXAM UNDER ANESTHESIA;  Surgeon: Joshua Roup, MD;  Location: [place];  Service: General;;   TONSILLECTOMY     child   TRANSURETHRAL RESECTION OF BLADDER TUMOR  02/2008   Social History   Social History Narrative   Patient is married, a daughter has Crohn's disease   No alcohol or drug use he is an intermittent smoker of cigarettes and cigars   family history includes Arthritis in his father; Crohn's disease in his daughter; Diabetes in his mother; Hypertension in his mother.   Review of Systems As above  Objective:   Physical Exam BP 130/70    Pulse 84    Ht 5\' 8"  (1.727 m)    Wt 201 lb (91.2 kg)    BMI 30.56 kg/m

## 2021-06-30 NOTE — Patient Instructions (Signed)
If you are age 66 or older, your body mass index should be between 23-30. Your Body mass index is 30.56 kg/m. If this is out of the aforementioned range listed, please consider follow up with your Primary Care Provider.  If you are age 67 or younger, your body mass index should be between 19-25. Your Body mass index is 30.56 kg/m. If this is out of the aformentioned range listed, please consider follow up with your Primary Care Provider.   ________________________________________________________  The [facility] providers would like to encourage you to use [facility] to communicate with providers for non-urgent requests or questions.  Due to long hold times on the telephone, sending your provider a message by [facility] may be a faster and more efficient way to get a response.  Please allow 48 business hours for a response.  Please remember that this is for non-urgent requests.  _______________________________________________________  Please go back and see Dr Lenna Gilford.   I appreciate the opportunity to care for you. Silvano Rusk, MD, [facility]

## 2021-07-05 DIAGNOSIS — M159 Polyosteoarthritis, unspecified: Secondary | ICD-10-CM | POA: Diagnosis not present

## 2021-07-05 DIAGNOSIS — L732 Hidradenitis suppurativa: Secondary | ICD-10-CM | POA: Diagnosis not present

## 2021-07-05 DIAGNOSIS — M2550 Pain in unspecified joint: Secondary | ICD-10-CM | POA: Diagnosis not present

## 2021-07-05 DIAGNOSIS — Z79899 Other long term (current) drug therapy: Secondary | ICD-10-CM | POA: Diagnosis not present

## 2021-07-05 DIAGNOSIS — L409 Psoriasis, unspecified: Secondary | ICD-10-CM | POA: Diagnosis not present

## 2021-07-05 DIAGNOSIS — L4050 Arthropathic psoriasis, unspecified: Secondary | ICD-10-CM | POA: Diagnosis not present

## 2021-07-08 DIAGNOSIS — H2513 Age-related nuclear cataract, bilateral: Secondary | ICD-10-CM | POA: Diagnosis not present

## 2021-07-08 DIAGNOSIS — H11001 Unspecified pterygium of right eye: Secondary | ICD-10-CM | POA: Diagnosis not present

## 2021-07-08 DIAGNOSIS — H25011 Cortical age-related cataract, right eye: Secondary | ICD-10-CM | POA: Diagnosis not present

## 2021-07-08 DIAGNOSIS — E119 Type 2 diabetes mellitus without complications: Secondary | ICD-10-CM | POA: Diagnosis not present

## 2021-07-08 LAB — HM DIABETES EYE EXAM

## 2021-07-19 DIAGNOSIS — L4050 Arthropathic psoriasis, unspecified: Secondary | ICD-10-CM | POA: Diagnosis not present

## 2021-07-21 ENCOUNTER — Encounter: Payer: Self-pay | Admitting: Family Medicine

## 2021-08-01 ENCOUNTER — Other Ambulatory Visit: Payer: Self-pay | Admitting: Family Medicine

## 2021-08-02 DIAGNOSIS — L4050 Arthropathic psoriasis, unspecified: Secondary | ICD-10-CM | POA: Diagnosis not present

## 2021-08-30 DIAGNOSIS — L4050 Arthropathic psoriasis, unspecified: Secondary | ICD-10-CM | POA: Diagnosis not present

## 2021-08-30 DIAGNOSIS — Z79899 Other long term (current) drug therapy: Secondary | ICD-10-CM | POA: Diagnosis not present

## 2021-09-05 ENCOUNTER — Telehealth: Payer: Self-pay | Admitting: Family Medicine

## 2021-09-05 NOTE — Telephone Encounter (Signed)
Left message for patient to call back and schedule Medicare Annual Wellness Visit (AWV) either virtually or in office. Left  my Joshua [name] number [phone] ? ? ?awvi 09/03/21 per [facility]  ? please schedule at anytime with [facility] Nurse Health Advisor 1 or 2 ? ? ?This should be a 45 minute visit.  ?

## 2021-09-06 ENCOUNTER — Ambulatory Visit (INDEPENDENT_AMBULATORY_CARE_PROVIDER_SITE_OTHER): Payer: Medicare Other

## 2021-09-06 VITALS — Ht 68.0 in | Wt 201.0 lb

## 2021-09-06 DIAGNOSIS — Z0000 Encounter for general adult medical examination without abnormal findings: Secondary | ICD-10-CM

## 2021-09-06 NOTE — Patient Instructions (Addendum)
?Mr. [name] , ?Thank you for taking time to come for your Medicare Wellness Visit. I appreciate your ongoing commitment to your health goals. Please review the following plan we discussed and let me know if I can assist you in the future.  ? ?These are the goals we discussed: ? Goals   ? ?   Increase physical activity (pt-stated)   ?   Stay active and keep my A1c down. ?  ? ?  ?  ?This is a list of the screening recommended for you and due dates:  ?Health Maintenance  ?Topic Date Due  ? Hemoglobin A1C  09/10/2019  ? Complete foot exam   03/11/2020  ? Urine Protein Check  03/11/2020  ? COVID-19 Vaccine (1) 09/22/2021*  ? Zoster (Shingles) Vaccine (1 of 2) 12/06/2021*  ? Pneumonia Vaccine (2 - PPSV23 if available, else PCV20) 04/12/2022*  ? HIV Screening  09/07/2022*  ? Flu Shot  01/03/2022  ? Eye exam for diabetics  07/08/2022  ? Colon Cancer Screening  11/05/2023  ? Tetanus Vaccine  12/27/2023  ? Hepatitis C Screening: USPSTF Recommendation to screen - Ages 48-79 yo.  Completed  ? HPV Vaccine  Aged Out  ?*Topic was postponed. The date shown is not the original due date.  ?  ?Advanced directives: Yes Patient will bring copy ? ?Conditions/risks identified: None ? ?Next appointment: Follow up in one year for your annual wellness visit.  ? ?Preventive Care 43 Years and Older, Male ?Preventive care refers to lifestyle choices and visits with your health care provider that can promote health and wellness. ?What does preventive care include? ?A yearly physical exam. This is also called an annual well check. ?Dental exams once or twice a year. ?Routine eye exams. Ask your health care provider how often you should have your eyes checked. ?Personal lifestyle choices, including: ?Daily care of your teeth and gums. ?Regular physical activity. ?Eating a healthy diet. ?Avoiding tobacco and drug use. ?Limiting alcohol use. ?Practicing safe sex. ?Taking low doses of aspirin every day. ?Taking vitamin and mineral supplements as  recommended by your health care provider. ?What happens during an annual well check? ?The services and screenings done by your health care provider during your annual well check will depend on your age, overall health, lifestyle risk factors, and family history of disease. ?Counseling  ?Your health care provider may ask you questions about your: ?Alcohol use. ?Tobacco use. ?Drug use. ?Emotional well-being. ?Home and relationship well-being. ?Sexual activity. ?Eating habits. ?History of falls. ?Memory and ability to understand (cognition). ?Work and work Statistician. ?Screening  ?You may have the following tests or measurements: ?Height, weight, and BMI. ?Blood pressure. ?Lipid and cholesterol levels. These may be checked every 5 years, or more frequently if you are over 75 years old. ?Skin check. ?Lung cancer screening. You may have this screening every year starting at age 56 if you have a 30-pack-year history of smoking and currently smoke or have quit within the past 15 years. ?Fecal occult blood test (FOBT) of the stool. You may have this test every year starting at age 8. ?Flexible sigmoidoscopy or colonoscopy. You may have a sigmoidoscopy every 5 years or a colonoscopy every 10 years starting at age 44. ?Prostate cancer screening. Recommendations will vary depending on your family history and other risks. ?Hepatitis C blood test. ?Hepatitis B blood test. ?Sexually transmitted disease (STD) testing. ?Diabetes screening. This is done by checking your blood sugar (glucose) after you have not eaten for a while (fasting).  You may have this done every 1-3 years. ?Abdominal aortic aneurysm (AAA) screening. You may need this if you are a current or former smoker. ?Osteoporosis. You may be screened starting at age 72 if you are at high risk. ?Talk with your health care provider about your test results, treatment options, and if necessary, the need for more tests. ?Vaccines  ?Your health care provider may recommend  certain vaccines, such as: ?Influenza vaccine. This is recommended every year. ?Tetanus, diphtheria, and acellular pertussis (Tdap, Td) vaccine. You may need a Td booster every 10 years. ?Zoster vaccine. You may need this after age 90. ?Pneumococcal 13-valent conjugate (PCV13) vaccine. One dose is recommended after age 79. ?Pneumococcal polysaccharide (PPSV23) vaccine. One dose is recommended after age 9. ?Talk to your health care provider about which screenings and vaccines you need and how often you need them. ?This information is not intended to replace advice given to you by your health care provider. Make sure you discuss any questions you have with your health care provider. ?Document Released: 06/18/2015 Document Revised: 02/09/2016 Document Reviewed: 03/23/2015 ?Elsevier Interactive Patient Education ? 2017 [place]. ? ?Fall Prevention in the Home ?Falls can cause injuries. They can happen to people of all ages. There are many things you can do to make your home safe and to help prevent falls. ?What can I do on the outside of my home? ?Regularly fix the edges of walkways and driveways and fix any cracks. ?Remove anything that might make you trip as you walk through a door, such as a raised step or threshold. ?Trim any bushes or trees on the path to your home. ?Use bright outdoor lighting. ?Clear any walking paths of anything that might make someone trip, such as rocks or tools. ?Regularly check to see if handrails are loose or broken. Make sure that both sides of any steps have handrails. ?Any raised decks and porches should have guardrails on the edges. ?Have any leaves, snow, or ice cleared regularly. ?Use sand or salt on walking paths during winter. ?Clean up any spills in your garage right away. This includes oil or grease spills. ?What can I do in the bathroom? ?Use night lights. ?Install grab bars by the toilet and in the tub and shower. Do not use towel bars as grab bars. ?Use non-skid mats or  decals in the tub or shower. ?If you need to sit down in the shower, use a plastic, non-slip stool. ?Keep the floor dry. Clean up any water that spills on the floor as soon as it happens. ?Remove soap buildup in the tub or shower regularly. ?Attach bath mats securely with double-sided non-slip rug tape. ?Do not have throw rugs and other things on the floor that can make you trip. ?What can I do in the bedroom? ?Use night lights. ?Make sure that you have a light by your bed that is easy to reach. ?Do not use any sheets or blankets that are too big for your bed. They should not hang down onto the floor. ?Have a firm chair that has side arms. You can use this for support while you get dressed. ?Do not have throw rugs and other things on the floor that can make you trip. ?What can I do in the kitchen? ?Clean up any spills right away. ?Avoid walking on wet floors. ?Keep items that you use a lot in easy-to-reach places. ?If you need to reach something above you, use a strong step stool that has a grab bar. ?Keep  electrical cords out of the way. ?Do not use floor polish or wax that makes floors slippery. If you must use wax, use non-skid floor wax. ?Do not have throw rugs and other things on the floor that can make you trip. ?What can I do with my stairs? ?Do not leave any items on the stairs. ?Make sure that there are handrails on both sides of the stairs and use them. Fix handrails that are broken or loose. Make sure that handrails are as long as the stairways. ?Check any carpeting to make sure that it is firmly attached to the stairs. Fix any carpet that is loose or worn. ?Avoid having throw rugs at the top or bottom of the stairs. If you do have throw rugs, attach them to the floor with carpet tape. ?Make sure that you have a light switch at the top of the stairs and the bottom of the stairs. If you do not have them, ask someone to add them for you. ?What else can I do to help prevent falls? ?Wear shoes that: ?Do not  have high heels. ?Have rubber bottoms. ?Are comfortable and fit you well. ?Are closed at the toe. Do not wear sandals. ?If you use a stepladder: ?Make sure that it is fully opened. Do not climb a closed stepla

## 2021-09-06 NOTE — Progress Notes (Signed)
? ?Subjective:  ? Joshua [name] is a 66 y.o. male who presents for Medicare Annual/Subsequent preventive examination. ? ?Review of Systems    ?Virtual Visit via Telephone Note ? ?I connected with  Joshua [name] on 09/06/21 at 10:15 AM EDT by telephone and verified that I am speaking with the correct person using two identifiers. ? ?Location: ?Patient: Home ?Provider: Office ?Persons participating in the virtual visit: patient/Nurse Health Advisor ?  ?I discussed the limitations, risks, security and privacy concerns of performing an evaluation and management service by telephone and the availability of in person appointments. The patient expressed understanding and agreed to proceed. ? ?Interactive audio and video telecommunications were attempted between this nurse and patient, however failed, due to patient having technical difficulties OR patient did not have access to video capability.  We continued and completed visit with audio only. ? ?Some vital signs may be absent or patient reported.  ? ?Criselda Peaches, LPN  ?Cardiac Risk Factors include: advanced age (>71mn, >>72women);diabetes mellitus;hypertension;male gender ? ?   ?Objective:  ?  ?Today's Vitals  ? 09/06/21 1016  ?Weight: 201 lb (91.2 kg)  ?Height: '5\' 8"'$  (1.727 m)  ? ?Body mass index is 30.56 kg/m?. ? ? ?  09/06/2021  ? 10:30 AM 02/03/2021  ?  8:32 AM 05/01/2019  ? 11:08 PM 03/27/2014  ? 12:29 PM 02/25/2014  ?  9:19 AM  ?Advanced Directives  ?Does Patient Have a Medical Advance Directive? Yes Yes No No No  ?Type of [place] Living will     ?Does patient want to make changes to medical advance directive? No - Patient declined No - Patient declined     ?Copy of HBajandasin Chart? No - copy requested      ?Would patient like information on creating a medical advance directive?   No - Patient declined Yes - Educational materials given No - patient declined information  ? ? ?Current  Medications (verified) ?Outpatient Encounter Medications as of 09/06/2021  ?Medication Sig  ? acetaminophen (TYLENOL) 325 MG tablet Take 650 mg by mouth every 6 (six) hours as needed.  ? BAYER MICROLET LANCETS lancets Check Blood Sugar Once Daily.  ? clindamycin (CLEOCIN T) 1 % lotion Apply topically daily.  ? ENBREL SURECLICK 50 MG/ML injection 50 mg once a week. Wednesday's  ? hydrocortisone (ANUSOL-HC) 2.5 % rectal cream APPLY RECTALLY TO THE AFFECTED AREA TWICE DAILY (Patient taking differently: Place 1 application rectally 2 (two) times daily.)  ? ibuprofen (ADVIL) 800 MG tablet Take 800 mg by mouth 3 (three) times daily as needed.  ? JARDIANCE 10 MG TABS tablet TAKE 1 TABLET BY MOUTH  DAILY  ? naproxen sodium (ANAPROX) 220 MG tablet Take 220 mg by mouth 2 (two) times daily as needed.  ? ONETOUCH ULTRA test strip USE AS DIRECTED TO CHECK BLOOD GLUCOSE ONE TIME DAILY  ? ?No facility-administered encounter medications on file as of 09/06/2021.  ? ? ?Allergies (verified) ?Adalimumab, Penicillin g, and Penicillins  ? ?History: ?Past Medical History:  ?Diagnosis Date  ? Anal fistula   ? Hidradenitis   ? History of bladder cancer 2009  ? urologist-- dr nMilford Cage--  s/p TURBT 2010,  recurrence in office 2014 ,  none since per pt  ? History of colonic polyps   ? History of COVID-19 04/2019  ? per pt had covid pneumonia recovered at home, no oxygen, symptoms resolved  ? History of diverticulitis of colon   ?  History of hypertension 04/05/2009  ? per the pt he lost 55 lbs and htn has been reversed  ? History of kidney stones   ? Hyperlipidemia 04/05/2009  ? pt states he does not take med for cholesterol  ? OA (osteoarthritis) 04/05/2009  ? Psoriatic arthritis ([place])   ? rheumonotologist--- dr aTrudie Reed  ? Type 2 diabetes mellitus ([place]) 04/05/2009  ? followed by pcp---  (01-31-2021  per pt only checks blood sugar once weekly, last fasting sugar-- 122)  ? ?Past Surgical History:  ?Procedure Laterality Date  ? COLONOSCOPY    ?  last one 11-04-2020  by gessner  ? EXTRACORPOREAL SHOCK WAVE LITHOTRIPSY  2015  ? INCISION AND DRAINAGE ABSCESS N/A 02/03/2021  ? Procedure: EXCISION OF PERIANAL AND PERINEAL HYDRADENITIS, INTERROGATION OF PERIANAL WOUNDS;  Surgeon: Ileana Roup, MD;  Location: [place];  Service: General;  Laterality: N/A;  ? RECTAL EXAM UNDER ANESTHESIA  02/03/2021  ? Procedure: ANORECTAL EXAM UNDER ANESTHESIA;  Surgeon: Ileana Roup, MD;  Location: [facility];  Service: General;;  ? TONSILLECTOMY    ? child  ? TRANSURETHRAL RESECTION OF BLADDER TUMOR  02/2008  ? ?Family History  ?Problem Relation Age of Onset  ? Diabetes Mother   ? Hypertension Mother   ? Arthritis Father   ? Crohn's disease Daughter   ? Esophageal cancer Neg Hx   ? Colon cancer Neg Hx   ? Pancreatic cancer Neg Hx   ? Stomach cancer Neg Hx   ? ?Social History  ? ?Socioeconomic History  ? Marital status: Married  ?  Spouse name: Not on file  ? Number of children: Not on file  ? Years of education: Not on file  ? Highest education level: Not on file  ?Occupational History  ? Not on file  ?Tobacco Use  ? Smoking status: Some Days  ?  Years: 45.00  ?  Types: Cigarettes, Cigars  ? Smokeless tobacco: Never  ? Tobacco comments:  ?  01-31-2021  per pt quit cigarettes 1981 (age 21) for 8 yrs;  since then has smoked cigar's socially  ?Vaping Use  ? Vaping Use: Never used  ?Substance and Sexual Activity  ? Alcohol use: Not Currently  ?  Comment: occasional  ? Drug use: No  ? Sexual activity: Not on file  ?Other Topics Concern  ? Not on file  ?Social History Narrative  ? Patient is married, a daughter has Crohn's disease  ? No alcohol or drug use he is an intermittent smoker of cigarettes and cigars  ? ?Social Determinants of Health  ? ?Financial Resource Strain: Low Risk   ? Difficulty of Paying Living Expenses: Not hard at all  ?Food Insecurity: No Food Insecurity  ? Worried About Charity fundraiser in the Last Year: Never  true  ? Ran Out of Food in the Last Year: Never true  ?Transportation Needs: No Transportation Needs  ? Lack of Transportation (Medical): No  ? Lack of Transportation (Non-Medical): No  ?Physical Activity: Insufficiently Active  ? Days of Exercise per Week: 2 days  ? Minutes of Exercise per Session: 60 min  ?Stress: No Stress Concern Present  ? Feeling of Stress : Not at all  ?Social Connections: Socially Integrated  ? Frequency of Communication with Friends and Family: More than three times a week  ? Frequency of Social Gatherings with Friends and Family: More than three times a week  ? Attends Religious Services: More than  4 times per year  ? Active Member of Clubs or Organizations: Yes  ? Attends Archivist Meetings: More than 4 times per year  ? Marital Status: Married  ? ? ?Tobacco Counseling ?Ready to quit: Not Answered ?Counseling given: Not Answered ?Tobacco comments: 01-31-2021  per pt quit cigarettes 1981 (age 36) for 8 yrs;  since then has smoked cigar's socially ? ? ?Clinical Intake: ?Nutrition Risk Assessment: ? ?Has the patient had any N/V/D within the last 2 months?  No  ?Does the patient have any non-healing wounds?  No  ?Has the patient had any unintentional weight loss or weight gain?  No  ? ?Diabetes: ? ?Is the patient diabetic?  Yes  ?If diabetic, was a CBG obtained today?  No  ?Did the patient bring in their glucometer from home?  No  ?How often do you monitor your CBG's? 1x weekly ?.  ? ?Financial Strains and Diabetes Management: ? ?Are you having any financial strains with the device, your supplies or your medication? No .  ?Does the patient want to be seen by Chronic Care Management for management of their diabetes?  No  ?Would the patient like to be referred to a Nutritionist or for Diabetic Management?  No  ? ?Diabetic Exams: ? ?Diabetic Eye Exam: Completed Yes. Overdue for diabetic eye exam. Pt has been advised about the importance in completing this exam. A referral has been  placed today. Message sent to referral coordinator for scheduling purposes. Advised pt to expect a call from office referred to regarding appt. ? ?Diabetic Foot Exam: Completed Yes. Pt has been advised about the i

## 2021-10-25 ENCOUNTER — Other Ambulatory Visit: Payer: Self-pay | Admitting: Family Medicine

## 2021-10-25 DIAGNOSIS — L4050 Arthropathic psoriasis, unspecified: Secondary | ICD-10-CM | POA: Diagnosis not present

## 2021-10-26 DIAGNOSIS — L4059 Other psoriatic arthropathy: Secondary | ICD-10-CM | POA: Diagnosis not present

## 2021-10-26 DIAGNOSIS — L409 Psoriasis, unspecified: Secondary | ICD-10-CM | POA: Diagnosis not present

## 2021-10-26 DIAGNOSIS — L732 Hidradenitis suppurativa: Secondary | ICD-10-CM | POA: Diagnosis not present

## 2021-10-26 DIAGNOSIS — M1991 Primary osteoarthritis, unspecified site: Secondary | ICD-10-CM | POA: Diagnosis not present

## 2021-10-26 DIAGNOSIS — Z79899 Other long term (current) drug therapy: Secondary | ICD-10-CM | POA: Diagnosis not present

## 2021-12-20 DIAGNOSIS — L4050 Arthropathic psoriasis, unspecified: Secondary | ICD-10-CM | POA: Diagnosis not present

## 2022-02-14 DIAGNOSIS — L4050 Arthropathic psoriasis, unspecified: Secondary | ICD-10-CM | POA: Diagnosis not present

## 2022-02-14 DIAGNOSIS — Z79899 Other long term (current) drug therapy: Secondary | ICD-10-CM | POA: Diagnosis not present

## 2022-02-21 ENCOUNTER — Telehealth: Payer: Self-pay | Admitting: *Deleted

## 2022-02-21 NOTE — Patient Outreach (Signed)
  Care Coordination   02/21/2022 Name: Joshua [name] MRN: [id number] DOB: 11-18-1955   Care Coordination Outreach Attempts:  An unsuccessful telephone outreach was attempted today to offer the patient information about available care coordination services as a benefit of their health plan.   Follow Up Plan:  Additional outreach attempts will be made to offer the patient care coordination information and services.   Encounter Outcome:  No Answer  Care Coordination Interventions Activated:  No   Care Coordination Interventions:  No, not indicated    Raina Mina, RN Care Management Coordinator [place] Office [phone]

## 2022-02-27 ENCOUNTER — Ambulatory Visit: Payer: Self-pay

## 2022-02-27 NOTE — Patient Outreach (Signed)
  Care Coordination   02/27/2022 Name: Joshua [name] MRN: [id number] DOB: 1956/02/16   Care Coordination Outreach Attempts:  A second unsuccessful outreach was attempted today to offer the patient with information about available care coordination services as a benefit of their health plan.     Follow Up Plan:  Additional outreach attempts will be made to offer the patient care coordination information and services.   Encounter Outcome:  No Answer  Care Coordination Interventions Activated:  No   Care Coordination Interventions:  No, not indicated    Daneen Schick, BSW, CDP Social Worker, Certified Dementia Practitioner [facility] Care Management  Care Coordination [phone]

## 2022-03-07 ENCOUNTER — Telehealth: Payer: Self-pay | Admitting: *Deleted

## 2022-03-07 NOTE — Patient Outreach (Signed)
  Care Coordination   03/07/2022 Name: Joshua [name] MRN: [id number] DOB: May 26, 1956   Care Coordination Outreach Attempts:  A third unsuccessful outreach was attempted today to offer the patient with information about available care coordination services as a benefit of their health plan.   Follow Up Plan:  No further outreach attempts will be made at this time. We have been unable to contact the patient to offer or enroll patient in care coordination services  Encounter Outcome:  No Answer  Care Coordination Interventions Activated:  No   Care Coordination Interventions:  No, not indicated    Raina Mina, RN Care Management Coordinator [place] Office [phone]

## 2022-03-15 IMAGING — MR MR PELVIS WO/W CM
5 of 8 series · 29 of 48 positions shown · IV contrast (gadavist)
Comparison: None.

CLINICAL DATA: History of a perianal fistula.

EXAM:
MRI PELVIS WITHOUT AND WITH CONTRAST
TECHNIQUE: Multiplanar multisequence MR imaging of the pelvis was performed
both before and after administration of intravenous contrast.
CONTRAST:  9mL GADAVIST GADOBUTROL 1 MMOL/ML IV SOLN

[Series 2: T2 · sagittal · 2.5mm · 1.02mm/px · 7 of 50 slices shown (1 of 2)]
[im 1/50]
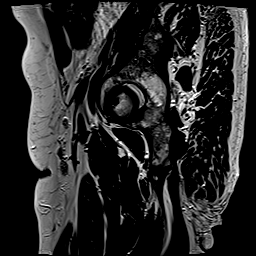
[im 9/50]
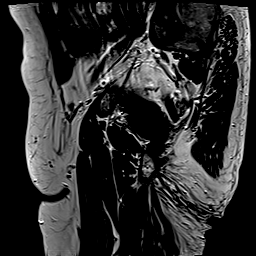
[im 17/50]
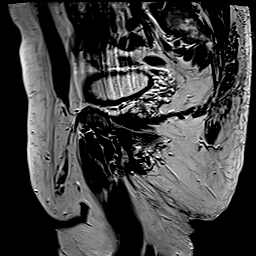
[im 25/50]
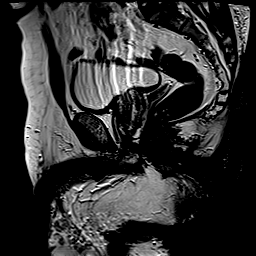
[im 33/50]
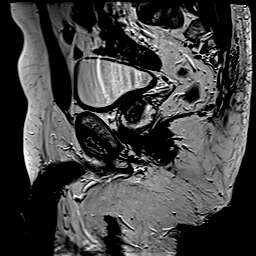
[im 41/50]
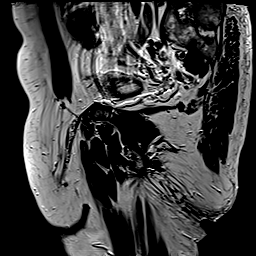
[im 50/50]
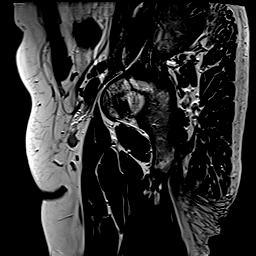

[Series 3: T2 fat-sat · sagittal · 2.5mm · 1.02mm/px · 7 of 50 slices shown (1 of 2)]
[im 1/50]
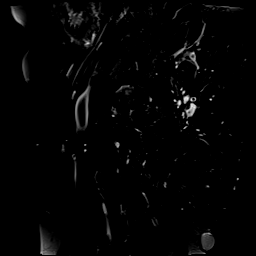
[im 9/50]
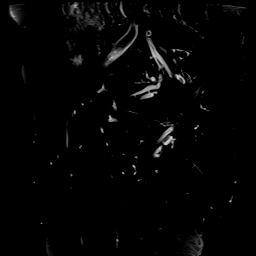
[im 17/50]
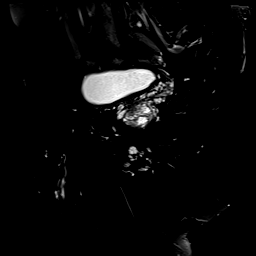
[im 25/50]
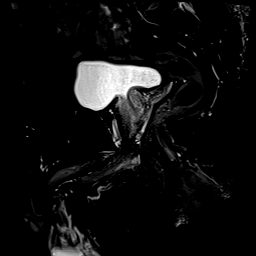
[im 33/50]
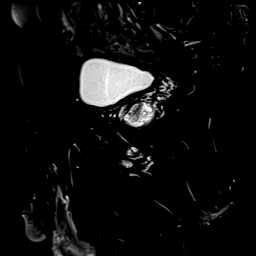
[im 41/50]
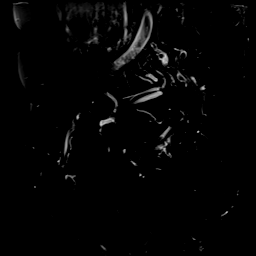
[im 50/50]
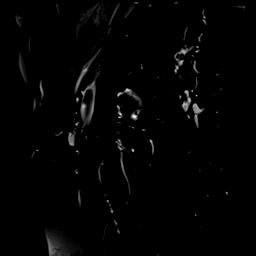

[Series 4: T1 · axial · 4.0mm · 0.43mm/px · z∈[-141,+55]mm · 6 of 45 slices shown]
[im 1/45]
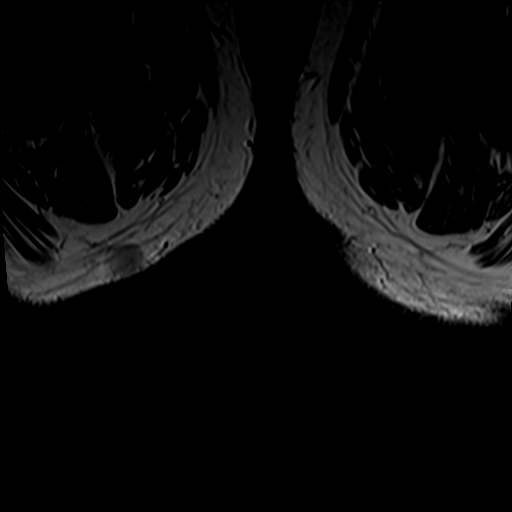
[im 9/45]
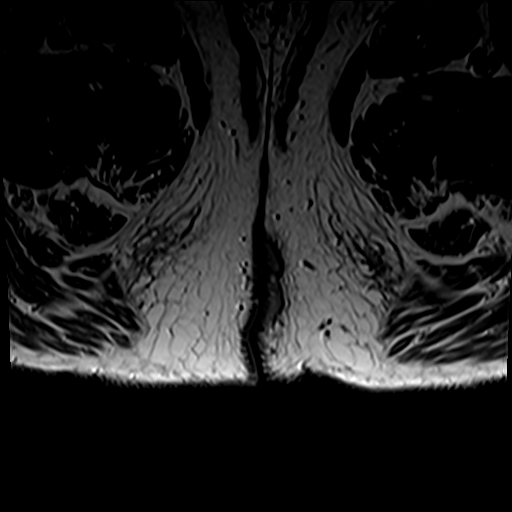
[im 18/45]
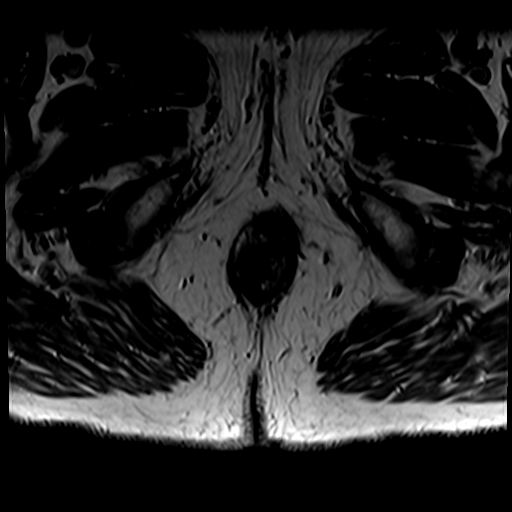
[im 27/45]
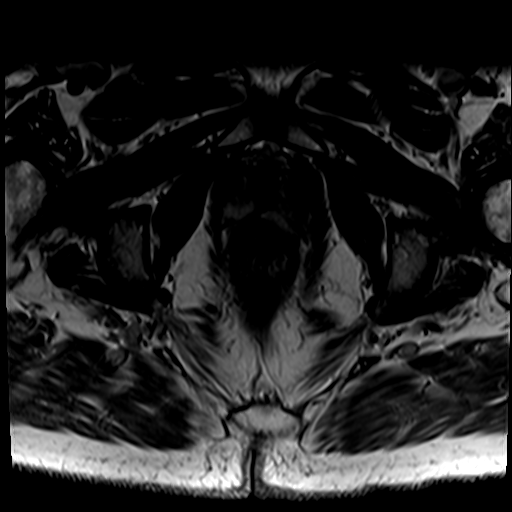
[im 36/45]
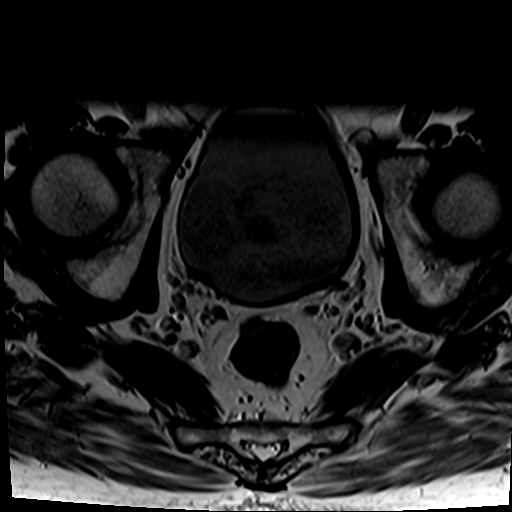
[im 45/45]
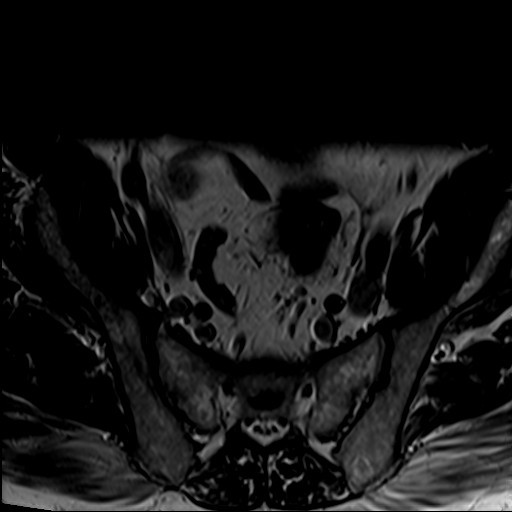

[Series 5: T2 · axial · 4.0mm · 0.43mm/px · z∈[-141,+55]mm · 6 of 45 slices shown (2 of 2)]
[im 1/45]
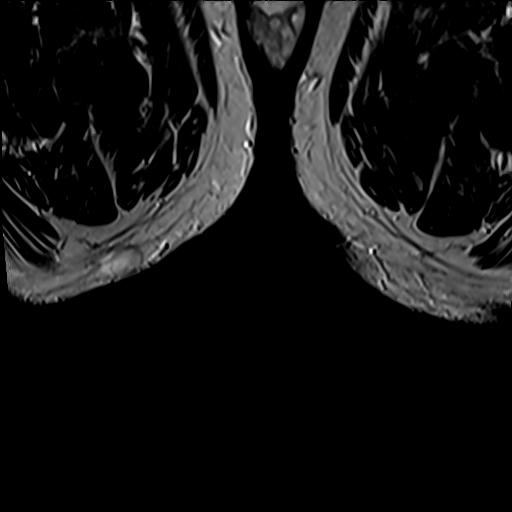
[im 9/45]
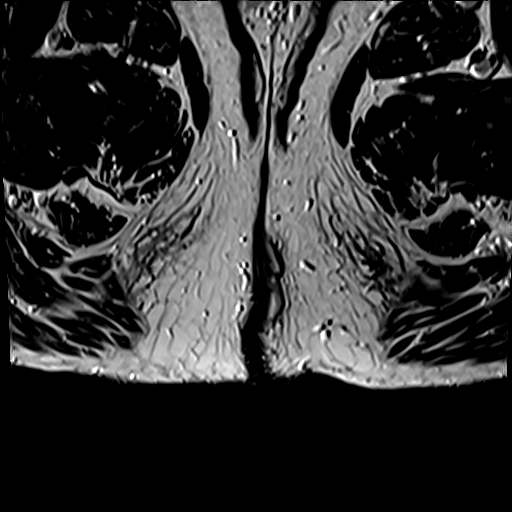
[im 18/45]
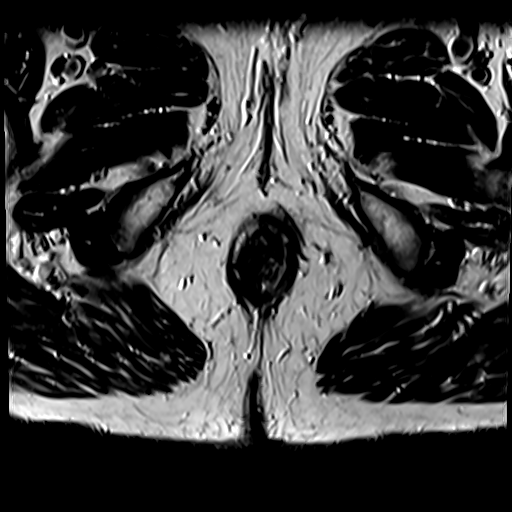
[im 27/45]
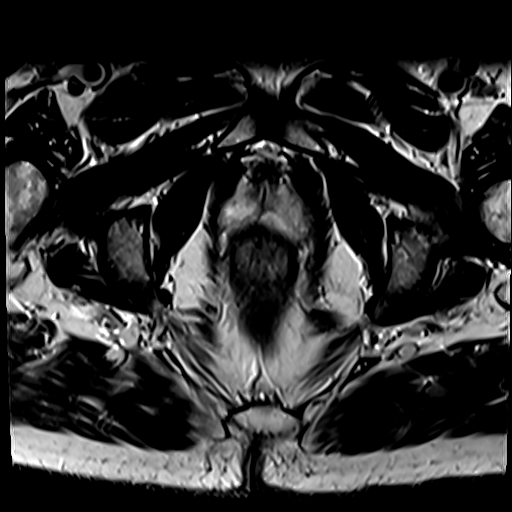
[im 36/45]
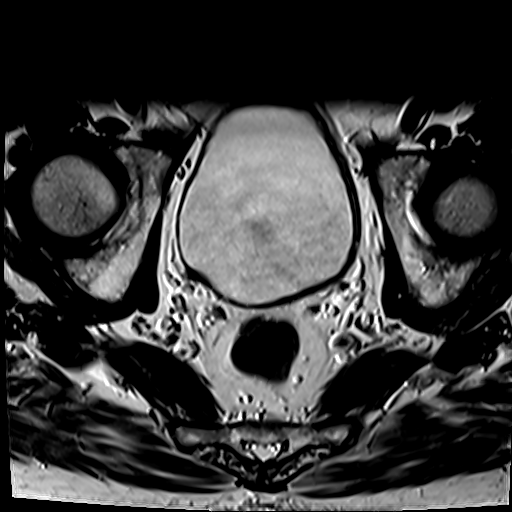
[im 45/45]
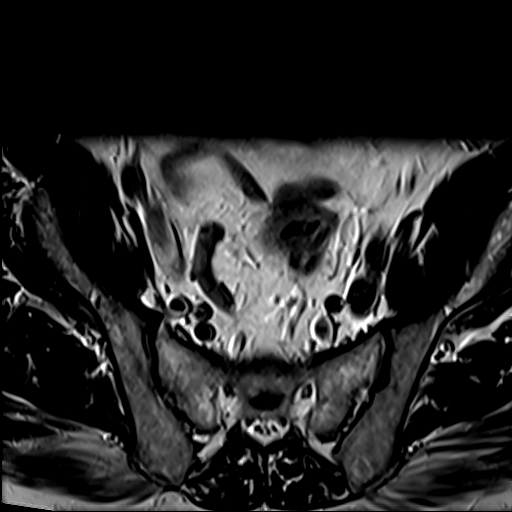

[Series 6: T2 fat-sat · axial · 4.0mm · 0.43mm/px · z∈[-141,-65]mm · 3 of 45 slices shown (2 of 2)]
[im 1/45]
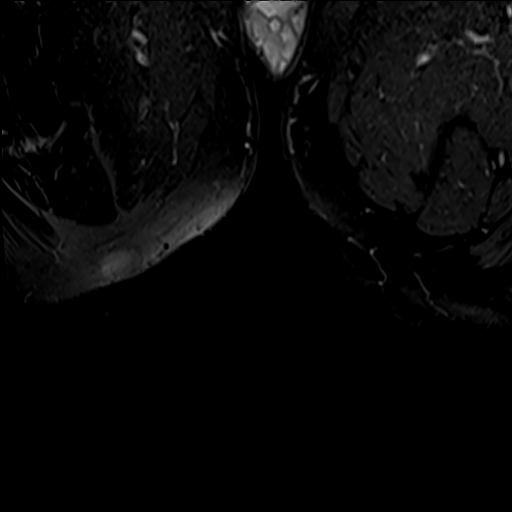
[im 9/45]
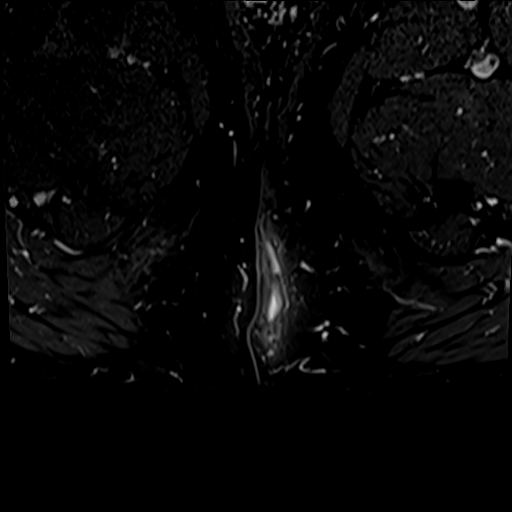
[im 18/45]
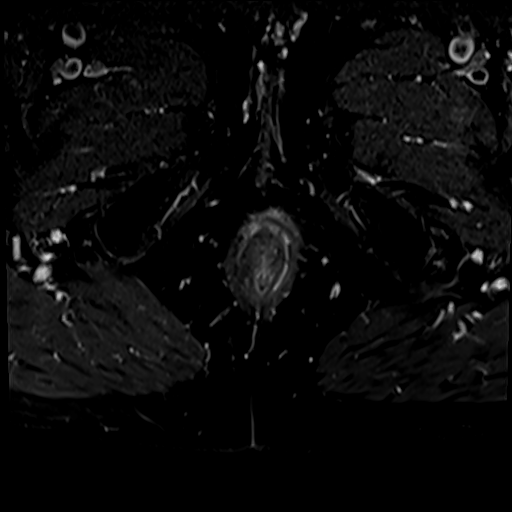

[29 of 48 positions shown; findings below may reference images not displayed]

FINDINGS: Urinary Tract: The bladder is unremarkable. No bladder mass or
bladder calculi.

Bowel: The rectum is unremarkable. No perirectal inflammatory
process. There is a left-sided perianal fistula this appears to
originate at the 1 o'clock position and and courses down the left
perineum. No associated discrete abscess is identified.

Vascular/Lymphatic: The major vascular structures are unremarkable.
No pelvic lymphadenopathy.

Reproductive: The prostate gland is normal size. There is slight
median lobe hypertrophy impressing on the base of the bladder. The
seminal vesicles are unremarkable.

Other:  No free pelvic fluid collections or intrapelvic abscess.

Musculoskeletal: The bony structures are unremarkable.
IMPRESSION: 1. Left-sided perianal fistula this appears to originate at the 1
o'clock position and courses down the left perineum. No associated
discrete abscess is identified.
2. No significant intra pelvic abnormalities.

## 2022-03-22 DIAGNOSIS — D2271 Melanocytic nevi of right lower limb, including hip: Secondary | ICD-10-CM | POA: Diagnosis not present

## 2022-03-22 DIAGNOSIS — D2272 Melanocytic nevi of left lower limb, including hip: Secondary | ICD-10-CM | POA: Diagnosis not present

## 2022-03-22 DIAGNOSIS — D225 Melanocytic nevi of trunk: Secondary | ICD-10-CM | POA: Diagnosis not present

## 2022-03-22 DIAGNOSIS — L814 Other melanin hyperpigmentation: Secondary | ICD-10-CM | POA: Diagnosis not present

## 2022-03-22 DIAGNOSIS — L400 Psoriasis vulgaris: Secondary | ICD-10-CM | POA: Diagnosis not present

## 2022-03-22 DIAGNOSIS — L732 Hidradenitis suppurativa: Secondary | ICD-10-CM | POA: Diagnosis not present

## 2022-03-22 DIAGNOSIS — L821 Other seborrheic keratosis: Secondary | ICD-10-CM | POA: Diagnosis not present

## 2022-03-22 DIAGNOSIS — L570 Actinic keratosis: Secondary | ICD-10-CM | POA: Diagnosis not present

## 2022-03-22 DIAGNOSIS — D1801 Hemangioma of skin and subcutaneous tissue: Secondary | ICD-10-CM | POA: Diagnosis not present

## 2022-03-22 DIAGNOSIS — R208 Other disturbances of skin sensation: Secondary | ICD-10-CM | POA: Diagnosis not present

## 2022-04-11 ENCOUNTER — Other Ambulatory Visit: Payer: Self-pay | Admitting: Family Medicine

## 2022-04-11 DIAGNOSIS — R5383 Other fatigue: Secondary | ICD-10-CM | POA: Diagnosis not present

## 2022-04-11 DIAGNOSIS — Z79899 Other long term (current) drug therapy: Secondary | ICD-10-CM | POA: Diagnosis not present

## 2022-04-11 DIAGNOSIS — L4059 Other psoriatic arthropathy: Secondary | ICD-10-CM | POA: Diagnosis not present

## 2022-05-03 ENCOUNTER — Other Ambulatory Visit: Payer: Self-pay | Admitting: Family Medicine

## 2022-06-07 DIAGNOSIS — L4050 Arthropathic psoriasis, unspecified: Secondary | ICD-10-CM | POA: Diagnosis not present

## 2022-06-29 DIAGNOSIS — C679 Malignant neoplasm of bladder, unspecified: Secondary | ICD-10-CM | POA: Diagnosis not present

## 2022-07-04 DIAGNOSIS — L4050 Arthropathic psoriasis, unspecified: Secondary | ICD-10-CM | POA: Diagnosis not present

## 2022-07-04 DIAGNOSIS — L409 Psoriasis, unspecified: Secondary | ICD-10-CM | POA: Diagnosis not present

## 2022-07-04 DIAGNOSIS — M1991 Primary osteoarthritis, unspecified site: Secondary | ICD-10-CM | POA: Diagnosis not present

## 2022-07-04 DIAGNOSIS — L732 Hidradenitis suppurativa: Secondary | ICD-10-CM | POA: Diagnosis not present

## 2022-08-02 ENCOUNTER — Other Ambulatory Visit: Payer: Self-pay | Admitting: Family Medicine

## 2022-08-02 DIAGNOSIS — Z79899 Other long term (current) drug therapy: Secondary | ICD-10-CM | POA: Diagnosis not present

## 2022-08-02 DIAGNOSIS — L4050 Arthropathic psoriasis, unspecified: Secondary | ICD-10-CM | POA: Diagnosis not present

## 2022-08-02 LAB — LAB REPORT - SCANNED: EGFR: 70

## 2022-08-09 DIAGNOSIS — E119 Type 2 diabetes mellitus without complications: Secondary | ICD-10-CM | POA: Diagnosis not present

## 2022-08-09 DIAGNOSIS — Z880 Allergy status to penicillin: Secondary | ICD-10-CM | POA: Diagnosis not present

## 2022-08-09 DIAGNOSIS — S61215A Laceration without foreign body of left ring finger without damage to nail, initial encounter: Secondary | ICD-10-CM | POA: Diagnosis not present

## 2022-08-09 DIAGNOSIS — W260XXA Contact with knife, initial encounter: Secondary | ICD-10-CM | POA: Diagnosis not present

## 2022-08-31 ENCOUNTER — Ambulatory Visit (INDEPENDENT_AMBULATORY_CARE_PROVIDER_SITE_OTHER): Payer: Medicare Other | Admitting: Family Medicine

## 2022-08-31 ENCOUNTER — Encounter: Payer: Self-pay | Admitting: Family Medicine

## 2022-08-31 VITALS — BP 110/80 | HR 71 | Temp 97.9°F | Wt 200.0 lb

## 2022-08-31 DIAGNOSIS — S61215D Laceration without foreign body of left ring finger without damage to nail, subsequent encounter: Secondary | ICD-10-CM

## 2022-08-31 DIAGNOSIS — E1165 Type 2 diabetes mellitus with hyperglycemia: Secondary | ICD-10-CM

## 2022-08-31 LAB — POCT GLYCOSYLATED HEMOGLOBIN (HGB A1C): Hemoglobin A1C: 8.5 % — AB (ref 4.0–5.6)

## 2022-08-31 MED ORDER — EMPAGLIFLOZIN 10 MG PO TABS: 10.0000 mg | ORAL_TABLET | Freq: Every day | ORAL | 1 refills | Status: DC

## 2022-08-31 NOTE — Progress Notes (Signed)
   Subjective:    Patient ID: Joshua [name], male    DOB: 1955/06/17, 67 y.o.   MRN: [id number]  HPI Here for 2 issues. First he asks for refills on Jardiance. He takes 10 mg daily. His last A1c here in 2020 was 6.8%, however today it is 8.5%. he admits to slipping on his diet the past few months. He has been in [place], [place] helping his wife empty out her father's home after his recent death. [name] says now that this is finished, he can focus on his health again. Also while there on 08-10-22 he cut his finger with a knife while he was slicing an onion. He went to an urgent care, and they placed 3 sutures. The wound is sensitive now, but he denies pain.    Review of Systems  Constitutional: Negative.   Respiratory: Negative.    Cardiovascular: Negative.   Skin:  Positive for wound.       Objective:   Physical Exam Constitutional:      Appearance: Normal appearance.  Cardiovascular:     Rate and Rhythm: Normal rate and regular rhythm.     Pulses: Normal pulses.     Heart sounds: Normal heart sounds.  Pulmonary:     Effort: Pulmonary effort is normal.     Breath sounds: Normal breath sounds.  Skin:    Comments: There is a 0.8 cm linear laceration on the tip of the left fourth finger. This is clean and closed.   Neurological:     Mental Status: He is alert.           Assessment & Plan:  His diabetes is not well controlled. We will refill the Jardiance, and he pledges to tighten up on his diet. For the finger laceration, all sutures were removed today. Alysia Penna, MD

## 2022-09-27 DIAGNOSIS — L4050 Arthropathic psoriasis, unspecified: Secondary | ICD-10-CM | POA: Diagnosis not present

## 2022-10-17 ENCOUNTER — Ambulatory Visit (INDEPENDENT_AMBULATORY_CARE_PROVIDER_SITE_OTHER): Payer: Medicare Other | Admitting: Urgent Care

## 2022-10-17 ENCOUNTER — Encounter: Payer: Self-pay | Admitting: Urgent Care

## 2022-10-17 VITALS — BP 132/86 | HR 83 | Temp 98.0°F | Wt 198.4 lb

## 2022-10-17 DIAGNOSIS — Z7984 Long term (current) use of oral hypoglycemic drugs: Secondary | ICD-10-CM | POA: Diagnosis not present

## 2022-10-17 DIAGNOSIS — R7309 Other abnormal glucose: Secondary | ICD-10-CM

## 2022-10-17 DIAGNOSIS — E1165 Type 2 diabetes mellitus with hyperglycemia: Secondary | ICD-10-CM | POA: Diagnosis not present

## 2022-10-17 DIAGNOSIS — R350 Frequency of micturition: Secondary | ICD-10-CM

## 2022-10-17 LAB — GLUCOSE, POCT (MANUAL RESULT ENTRY): POC Glucose: 212 mg/dl — AB (ref 70–99)

## 2022-10-17 LAB — POC URINALSYSI DIPSTICK (AUTOMATED)
Bilirubin, UA: NEGATIVE
Blood, UA: NEGATIVE
Glucose, UA: POSITIVE — AB
Ketones, UA: NEGATIVE
Leukocytes, UA: NEGATIVE
Nitrite, UA: NEGATIVE
Protein, UA: NEGATIVE
Spec Grav, UA: 1.015 (ref 1.010–1.025)
Urobilinogen, UA: NEGATIVE E.U./dL — AB
pH, UA: 6 (ref 5.0–8.0)

## 2022-10-17 NOTE — Progress Notes (Signed)
Acute Office Visit  Subjective:     Patient ID: Joshua [name], male    DOB: 1956/04/09, 67 y.o.   MRN: [id number]  Chief Complaint  Patient presents with   Urinary Tract Infection    Possibly, started with pressure last night, had to get up 4 times last. Does not hurt, not tender. Littl pressure now, not like last night, not as freq as last night    Urinary Tract Infection    Patient is in today for possible UTI.  He states that last night he got from bed up 4 or 5 times to urinate.  He denies any dysuria, but states he felt some type of pelvic pressure.  The pressure was only in the suprapubic region.  He does have a history of kidney stones, but states this feels much different.  He denies flank pain. He states he woke up this morning, and the pubic pressure is basically resolved, but he is having some gas pains in his abdomen.  He did have a rather large bowel movement that felt slightly like constipation this morning, which he attributes to resolving his pelvic pressure.  He did not take anything for the improvement; it resolved spontaneously.  He has not had urinary frequency as much today.  He denies blood in the urine.  He denies a fever.  He denies nausea or vomiting.  He did just recently start the Mediterranean diet to help with his blood sugar.  He also started taking apple cider vinegar about a month ago.  He states his fasting sugars are between the 120-150 range.  He has not yet checked his sugar today however, last meal was eaten around 11 AM.  ROS As per HPI     Objective:    BP 132/86 (BP Location: Left Arm, Patient Position: Sitting, Cuff Size: Normal)   Pulse 83   Temp 98 F (36.7 C) (Oral)   Wt 198 lb 6.4 oz (90 kg)   SpO2 94%   BMI 30.17 kg/m  Wt Readings from Last 3 Encounters:  10/17/22 198 lb 6.4 oz (90 kg)  08/31/22 200 lb (90.7 kg)  09/06/21 201 lb (91.2 kg)      Physical Exam Vitals and nursing note reviewed. Exam conducted with a chaperone  present.  Constitutional:      General: He is not in acute distress.    Appearance: Normal appearance. He is obese. He is not ill-appearing, toxic-appearing or diaphoretic.  HENT:     Head: Normocephalic and atraumatic.     Mouth/Throat:     Mouth: Mucous membranes are moist.  Cardiovascular:     Rate and Rhythm: Normal rate.  Pulmonary:     Effort: Pulmonary effort is normal. No respiratory distress.  Abdominal:     General: Abdomen is flat. Bowel sounds are normal. There is no distension.     Palpations: Abdomen is soft. There is no mass.     Tenderness: There is no abdominal tenderness. There is no right CVA tenderness, left CVA tenderness, guarding or rebound.     Hernia: No hernia is present.  Musculoskeletal:     Cervical back: Normal range of motion.     Right lower leg: No edema.     Left lower leg: No edema.  Skin:    General: Skin is warm and dry.     Coloration: Skin is not jaundiced.     Findings: No bruising, erythema or rash.  Neurological:     General: No  focal deficit present.     Mental Status: He is alert and oriented to person, place, and time.     Gait: Gait normal.     Results for orders placed or performed in visit on 10/17/22  POCT Urinalysis Dipstick (Automated)  Result Value Ref Range   Color, UA yellow    Clarity, UA clear    Glucose, UA Positive (A) Negative   Bilirubin, UA neg    Ketones, UA neg    Spec Grav, UA 1.015 1.010 - 1.025   Blood, UA neg    pH, UA 6.0 5.0 - 8.0   Protein, UA Negative Negative   Urobilinogen, UA negative (A) 0.2 or 1.0 E.U./dL   Nitrite, UA neg    Leukocytes, UA Negative Negative  POC Glucose (CBG)  Result Value Ref Range   POC Glucose 212 (A) 70 - 99 mg/dl        Assessment & Plan:   Problem List Items Addressed This Visit       Endocrine   Type 2 diabetes mellitus (HCC)    We spent extensive time discussing CHO control and dietary planning. We reviewed the 15g/ serving and 45g/ meal rule. We discussed  starches and CHO in depth to improve his sugar readings. Encouraged more veggies and proteins.  Pt's sugar in office 212 four hours after eating. Discussed goal is <140 two hours post-prandial. Pt to continue dietary changes. Increased physical activity and moderate weight loss may also be beneficial.      Other Visit Diagnoses     Urinary frequency    -  Primary   Relevant Orders   POCT Urinalysis Dipstick (Automated) (Completed)   Culture, Urine   POC Glucose (CBG) (Completed)   Elevated serum glucose with glucosuria          Pts urinary sx are resolving. UA in office not indicative of UTI. Will send out cx for confirmation. It may have been related to his BM this morning. I suspect some of his frequency may also be related to his elevated glucose. He is only taking Jardiance 10mg . He is working on dietary changes. We spent about 10 minutes discussing carbohydrate counting and appropriate meal planning to achieve better glycemic goals. No rx today, pt to notify our office if symptoms persist or if new symptoms occur.  No orders of the defined types were placed in this encounter.   Pt should return for his routine diabetes eval with PCP around mid-July 2024  [facility] [place], [place]

## 2022-10-17 NOTE — Patient Instructions (Signed)
Your urinalysis does not show evidence of an infection. We will send it out for culture however to ensure there is no bacteria. If it is positive, we will contact you to start an antibiotic.  You do have significant glucose in your urine, your blood sugar was 212. The goal glucose level 2 hours after eating is less than 140.  Please continue working on your diet.  Please review the handout provided today to further count carbohydrates. Eat slower, drink more water, use a smaller plate, and always eat a protein when you have a carbohydrate.  If your symptoms persist or worsen, please return to the clinic for a further evaluation.

## 2022-10-17 NOTE — Assessment & Plan Note (Signed)
We spent extensive time discussing CHO control and dietary planning. We reviewed the 15g/ serving and 45g/ meal rule. We discussed starches and CHO in depth to improve his sugar readings. Encouraged more veggies and proteins.  Pt's sugar in office 212 four hours after eating. Discussed goal is <140 two hours post-prandial. Pt to continue dietary changes. Increased physical activity and moderate weight loss may also be beneficial.

## 2022-10-18 LAB — URINE CULTURE
MICRO NUMBER:: 14953889
Result:: NO GROWTH
SPECIMEN QUALITY:: ADEQUATE

## 2022-11-22 DIAGNOSIS — L4050 Arthropathic psoriasis, unspecified: Secondary | ICD-10-CM | POA: Diagnosis not present

## 2022-12-11 ENCOUNTER — Encounter: Payer: Self-pay | Admitting: Family Medicine

## 2022-12-11 ENCOUNTER — Ambulatory Visit (INDEPENDENT_AMBULATORY_CARE_PROVIDER_SITE_OTHER): Payer: Medicare Other | Admitting: Family Medicine

## 2022-12-11 VITALS — BP 142/84 | HR 85 | Temp 98.0°F | Ht 68.0 in | Wt 187.7 lb

## 2022-12-11 DIAGNOSIS — E1165 Type 2 diabetes mellitus with hyperglycemia: Secondary | ICD-10-CM | POA: Diagnosis not present

## 2022-12-11 DIAGNOSIS — R1030 Lower abdominal pain, unspecified: Secondary | ICD-10-CM

## 2022-12-11 DIAGNOSIS — Z7984 Long term (current) use of oral hypoglycemic drugs: Secondary | ICD-10-CM

## 2022-12-11 LAB — POCT GLYCOSYLATED HEMOGLOBIN (HGB A1C): Hemoglobin A1C: 7.6 % — AB (ref 4.0–5.6)

## 2022-12-11 MED ORDER — DICYCLOMINE HCL 20 MG PO TABS: 20.0000 mg | ORAL_TABLET | Freq: Four times a day (QID) | ORAL | 1 refills | Status: DC | PRN

## 2022-12-11 MED ORDER — METRONIDAZOLE 500 MG PO TABS: 500.0000 mg | ORAL_TABLET | Freq: Three times a day (TID) | ORAL | 0 refills | Status: AC

## 2022-12-11 MED ORDER — CIPROFLOXACIN HCL 500 MG PO TABS: 500.0000 mg | ORAL_TABLET | Freq: Two times a day (BID) | ORAL | 0 refills | Status: AC

## 2022-12-11 NOTE — Progress Notes (Signed)
Established Patient Office Visit  Subjective   Patient ID: Joshua [name], male    DOB: 18-Jun-1955  Age: 67 y.o. MRN: [id number]  Chief Complaint  Patient presents with   Diverticulitis    Patient complains of Diverticulitis, x6 days, Reports diarrhea, no fever    HPI   Joshua [name] is seen with concern for possible diverticulitis flare.  He had last colonoscopy 2002 which showed sigmoid colon diverticulosis.  He states last Tuesday he had some cramping with stools.  No bloody stools.  No fever.  Lower abdominal pain which he thinks was somewhat bilateral.  He had similar flare back in February which he recalls eventually cleared on its own after several days.  No recent nausea or vomiting.  Type 2 diabetes.  He was seen in my absence back in March and A1c 8.5%.  Takes Jardiance 10 mg daily.  He preferred lifestyle changes and tightening up diet versus additional medication.  He has lost 13 pounds since then which he attributes to his efforts.  Following a Mediterranean type diet.  Fasting sugars usually around 140.  Past Medical History:  Diagnosis Date   Anal fistula    Hidradenitis    History of bladder cancer 2009   urologist-- [name]---  s/p TURBT 2010,  recurrence in office 2014 ,  none since per pt   History of colonic polyps    History of COVID-19 04/2019   per pt had covid pneumonia recovered at home, no oxygen, symptoms resolved   History of diverticulitis of colon    History of hypertension 04/05/2009   per the pt he lost 55 lbs and htn has been reversed   History of kidney stones    Hyperlipidemia 04/05/2009   pt states he does not take med for cholesterol   OA (osteoarthritis) 04/05/2009   Psoriatic arthritis (HCC)    rheumonotologist--- [name]   Type 2 diabetes mellitus (HCC) 04/05/2009   followed by pcp---  (01-31-2021  per pt only checks blood sugar once weekly, last fasting sugar-- 122)   Past Surgical History:  Procedure Laterality Date   COLONOSCOPY      last one 11-04-2020  by gessner   EXTRACORPOREAL SHOCK WAVE LITHOTRIPSY  2015   INCISION AND DRAINAGE ABSCESS N/A 02/03/2021   Procedure: EXCISION OF PERIANAL AND PERINEAL HYDRADENITIS, INTERROGATION OF PERIANAL WOUNDS;  Surgeon: [name], MD;  Location: [facility];  Service: General;  Laterality: N/A;   RECTAL EXAM UNDER ANESTHESIA  02/03/2021   Procedure: ANORECTAL EXAM UNDER ANESTHESIA;  Surgeon: [name], MD;  Location: [facility];  Service: General;;   TONSILLECTOMY     child   TRANSURETHRAL RESECTION OF BLADDER TUMOR  02/2008    reports that he has been smoking cigarettes and cigars. He has never used smokeless tobacco. He reports that he does not currently use alcohol. He reports that he does not use drugs. family history includes Arthritis in his father; Crohn's disease in his daughter; Diabetes in his mother; Hypertension in his mother. Allergies  Allergen Reactions   Adalimumab Other (See Comments)   Penicillin G Other (See Comments)   Penicillins Hives    And fever blisters    Review of Systems  Constitutional:  Negative for chills, fever and weight loss.  Respiratory:  Negative for cough and shortness of breath.   Cardiovascular:  Negative for chest pain and orthopnea.  Gastrointestinal:  Positive for abdominal pain. Negative for blood in stool,  constipation, diarrhea, heartburn, melena, nausea and vomiting.  Genitourinary:  Negative for dysuria, flank pain and hematuria.  Neurological:  Negative for headaches.  Psychiatric/Behavioral:  Negative for depression.       Objective:     BP (!) 142/84 (BP Location: Left Arm, Patient Position: Sitting, Cuff Size: Normal)   Pulse 85   Temp 98 F (36.7 C) (Oral)   Ht 5\' 8"  (1.727 m)   Wt 187 lb 11.2 oz (85.1 kg)   SpO2 98%   BMI 28.54 kg/m  BP Readings from Last 3 Encounters:  12/11/22 (!) 142/84  10/17/22 132/86  08/31/22 110/80   Wt Readings from Last 3  Encounters:  12/11/22 187 lb 11.2 oz (85.1 kg)  10/17/22 198 lb 6.4 oz (90 kg)  08/31/22 200 lb (90.7 kg)      Physical Exam Vitals reviewed.  Constitutional:      General: He is not in acute distress.    Appearance: He is not ill-appearing.  Cardiovascular:     Rate and Rhythm: Normal rate and regular rhythm.  Pulmonary:     Effort: Pulmonary effort is normal.     Breath sounds: Normal breath sounds.  Abdominal:     Palpations: Abdomen is soft. There is no mass.     Tenderness: There is no abdominal tenderness. There is no guarding or rebound.  Neurological:     Mental Status: He is alert.      Results for orders placed or performed in visit on 12/11/22  POC HgB A1c  Result Value Ref Range   Hemoglobin A1C 7.6 (A) 4.0 - 5.6 %   HbA1c POC (<> result, manual entry)     HbA1c, POC (prediabetic range)     HbA1c, POC (controlled diabetic range)      Last CBC Lab Results  Component Value Date   WBC 7.8 04/12/2021   HGB 15.1 04/12/2021   HCT 46.0 04/12/2021   MCV 95.6 04/12/2021   MCH 32.3 05/01/2019   RDW 13.2 04/12/2021   PLT 301.0 04/12/2021   Last metabolic panel Lab Results  Component Value Date   GLUCOSE 128 (H) 04/12/2021   NA 137 04/12/2021   K 4.1 04/12/2021   CL 102 04/12/2021   CO2 26 04/12/2021   BUN 18 04/12/2021   CREATININE 0.98 04/12/2021   EGFR 70.0 08/02/2022   CALCIUM 9.3 04/12/2021   PROT 8.0 04/12/2021   ALBUMIN 4.1 04/12/2021   BILITOT 0.5 04/12/2021   ALKPHOS 79 04/12/2021   AST 18 04/12/2021   ALT 19 04/12/2021   ANIONGAP 15 05/01/2019   Last lipids Lab Results  Component Value Date   CHOL 188 04/12/2021   HDL 42.10 04/12/2021   LDLCALC 119 (H) 04/12/2021   LDLDIRECT 131.0 09/02/2018   TRIG 136.0 04/12/2021   CHOLHDL 4 04/12/2021   Last hemoglobin A1c Lab Results  Component Value Date   HGBA1C 7.6 (A) 12/11/2022      The 10-year ASCVD risk score (Arnett DK, et al., 2019) is: 40.6%    Assessment & Plan:   #1  lower abdominal pain.  Question is whether this represents mild diverticulitis flare versus other.  Symptoms are actually improving some at this time.  We have not recommend antibiotics today as he is significantly improved compared to last week.  No fever.  Nonfocal exam.  He did have concern because he is going on vacation later this week and we agreed to print out prescription for Cipro and metronidazole only to  start if he has worsening pain, fever, etc.  #2 type 2 diabetes suboptimally controlled with recent A1c 8.5%.  This is improved today to 7.6%.  Still off goal.  Micah Flesher over options.  He would like to give this 3 more months of lifestyle modification.  Set up physical in 3 months and recheck A1c then.  If not below 7 at that point either increase Jardiance or additional medication   Evelena Peat, MD

## 2023-01-02 DIAGNOSIS — M5431 Sciatica, right side: Secondary | ICD-10-CM | POA: Diagnosis not present

## 2023-01-02 DIAGNOSIS — L732 Hidradenitis suppurativa: Secondary | ICD-10-CM | POA: Diagnosis not present

## 2023-01-02 DIAGNOSIS — L4059 Other psoriatic arthropathy: Secondary | ICD-10-CM | POA: Diagnosis not present

## 2023-01-02 DIAGNOSIS — Z79899 Other long term (current) drug therapy: Secondary | ICD-10-CM | POA: Diagnosis not present

## 2023-01-02 DIAGNOSIS — L409 Psoriasis, unspecified: Secondary | ICD-10-CM | POA: Diagnosis not present

## 2023-01-02 DIAGNOSIS — M1991 Primary osteoarthritis, unspecified site: Secondary | ICD-10-CM | POA: Diagnosis not present

## 2023-01-15 ENCOUNTER — Ambulatory Visit (INDEPENDENT_AMBULATORY_CARE_PROVIDER_SITE_OTHER): Payer: Medicare Other | Admitting: Family Medicine

## 2023-01-15 ENCOUNTER — Encounter: Payer: Self-pay | Admitting: Family Medicine

## 2023-01-15 ENCOUNTER — Other Ambulatory Visit: Payer: Self-pay | Admitting: Family Medicine

## 2023-01-15 VITALS — BP 134/72 | HR 85 | Temp 98.7°F | Ht 68.0 in | Wt 187.0 lb

## 2023-01-15 DIAGNOSIS — R1032 Left lower quadrant pain: Secondary | ICD-10-CM | POA: Diagnosis not present

## 2023-01-15 NOTE — Patient Instructions (Signed)
We will be setting up CT abdomen and pelvis to further assess.

## 2023-01-15 NOTE — Progress Notes (Signed)
Established Patient Office Visit  Subjective   Patient ID: Joshua [name], male    DOB: Oct 11, 1955  Age: 67 y.o. MRN: [id number]  Chief Complaint  Patient presents with   Follow-up    Pt reports he still has diarrhea and gas pain. Sx started on 12/05/2022. Sx worsen from last visit on 12/11/2022. Just finish cipro this past Thursday.     HPI   Joshua [name] is seen accompanied by wife with ongoing left lower quadrant pain issues.  Refer to note from 12-11-2022 for details.  His last colonoscopy in 2022 showed sigmoid colon diverticulosis.  He started around early July with intermittent lower abdominal cramp-like pains.  No documented fever.  He was actually improving last visit back in early July and we withheld antibiotics at that time.  For 2 weeks now although he has had progressive pains especially left lower quadrant greater than right with somewhat of a burning sensation at times.  He tried some dicyclomine without much benefit if any.  Symptoms are somewhat intermittent.  Occasional nausea but no vomiting.  He started Cipro alone recently couple weeks ago and finished this last Thursday.  He had some metronidazole but never took the metronidazole and misunderstood he was that he was supposed to take these together  No dysuria.  He does have type 2 diabetes with improving control recently by home readings with most of his fastings around 100. He is having occasional diarrhea stools and gaseous pains intermittently.  He states he has lost about 7 pounds by his scales-over past several weeks.  He has actually lost about 11 pounds compared to May by our scales but weight is stable compared with July visit.  Past Medical History:  Diagnosis Date   Anal fistula    Hidradenitis    History of bladder cancer 2009   urologist-- [name]---  s/p TURBT 2010,  recurrence in office 2014 ,  none since per pt   History of colonic polyps    History of COVID-19 04/2019   per pt had covid pneumonia recovered  at home, no oxygen, symptoms resolved   History of diverticulitis of colon    History of hypertension 04/05/2009   per the pt he lost 55 lbs and htn has been reversed   History of kidney stones    Hyperlipidemia 04/05/2009   pt states he does not take med for cholesterol   OA (osteoarthritis) 04/05/2009   Psoriatic arthritis (HCC)    rheumonotologist--- [name]   Type 2 diabetes mellitus (HCC) 04/05/2009   followed by pcp---  (01-31-2021  per pt only checks blood sugar once weekly, last fasting sugar-- 122)   Past Surgical History:  Procedure Laterality Date   COLONOSCOPY     last one 11-04-2020  by gessner   EXTRACORPOREAL SHOCK WAVE LITHOTRIPSY  2015   INCISION AND DRAINAGE ABSCESS N/A 02/03/2021   Procedure: EXCISION OF PERIANAL AND PERINEAL HYDRADENITIS, INTERROGATION OF PERIANAL WOUNDS;  Surgeon: [name], MD;  Location: [facility];  Service: General;  Laterality: N/A;   RECTAL EXAM UNDER ANESTHESIA  02/03/2021   Procedure: ANORECTAL EXAM UNDER ANESTHESIA;  Surgeon: [name], MD;  Location: [facility];  Service: General;;   TONSILLECTOMY     child   TRANSURETHRAL RESECTION OF BLADDER TUMOR  02/2008    reports that he has been smoking cigarettes and cigars. He has never used smokeless tobacco. He reports that he does not currently use alcohol. He  reports that he does not use drugs. family history includes Arthritis in his father; Crohn's disease in his daughter; Diabetes in his mother; Hypertension in his mother. Allergies  Allergen Reactions   Adalimumab Other (See Comments)   Penicillin G Other (See Comments)   Penicillins Hives    And fever blisters    Review of Systems  Constitutional:  Negative for chills and fever.  Respiratory:  Negative for cough.   Cardiovascular:  Negative for chest pain.  Gastrointestinal:  Positive for abdominal pain, diarrhea and nausea. Negative for blood in stool, constipation,  heartburn, melena and vomiting.      Objective:     BP 134/72 (BP Location: Left Arm, Patient Position: Sitting, Cuff Size: Normal)   Pulse 85   Temp 98.7 F (37.1 C) (Oral)   Ht 5\' 8"  (1.727 m)   Wt 187 lb (84.8 kg)   SpO2 97%   BMI 28.43 kg/m  BP Readings from Last 3 Encounters:  01/15/23 134/72  12/13/22 (!) 142/84  10/17/22 132/86   Wt Readings from Last 3 Encounters:  01/15/23 187 lb (84.8 kg)  12/11/22 187 lb 11.2 oz (85.1 kg)  10/17/22 198 lb 6.4 oz (90 kg)      Physical Exam Vitals reviewed.  Constitutional:      General: He is not in acute distress.    Appearance: Normal appearance. He is not ill-appearing.  Cardiovascular:     Rate and Rhythm: Normal rate and regular rhythm.  Pulmonary:     Effort: Pulmonary effort is normal.     Breath sounds: Normal breath sounds.  Abdominal:     Palpations: Abdomen is soft.     Tenderness: There is abdominal tenderness.     Comments: Abdomen is soft but tender left lower quadrant.  No rebound tenderness.  Minimal guarding left lower quadrant  Musculoskeletal:     Right lower leg: No edema.     Left lower leg: No edema.  Neurological:     Mental Status: He is alert.      No results found for any visits on 01/15/23.    The 10-year ASCVD risk score (Arnett DK, et al., 2019) is: 37.5%    Assessment & Plan:   Problem List Items Addressed This Visit   None Visit Diagnoses     Abdominal pain, LLQ    -  Primary   Relevant Orders   CBC with Differential/Platelet   CMP   CBC with Differential/Platelet   CMP     Patient relates over 1 month history of intermittent abdominal pain mostly left lower quadrant.  He has tenderness to palpation left lower quadrant today and unimproved after taking Cipro.  He misunderstood and did not take the metronidazole concomitantly.  Nontoxic in appearance.  No acute abdomen findings on exam at this time  -Given duration of symptoms CT abdomen and pelvis to further  assess -Also recommend labs with CBC and CMP -If CT above unrevealing and symptoms persist consider getting back into see GI -He knows to go immediately to ER if he develops any fever, worsening abdominal pain, or other concerns such as recurrent vomiting  No follow-ups on file.    Evelena Peat, MD

## 2023-01-16 ENCOUNTER — Other Ambulatory Visit (INDEPENDENT_AMBULATORY_CARE_PROVIDER_SITE_OTHER): Payer: Medicare Other

## 2023-01-16 ENCOUNTER — Telehealth: Payer: Self-pay | Admitting: Family Medicine

## 2023-01-16 DIAGNOSIS — R1032 Left lower quadrant pain: Secondary | ICD-10-CM | POA: Diagnosis not present

## 2023-01-16 LAB — CBC WITH DIFFERENTIAL/PLATELET
Basophils Absolute: 0 10*3/uL (ref 0.0–0.1)
Basophils Relative: 0.6 % (ref 0.0–3.0)
Eosinophils Absolute: 0.1 10*3/uL (ref 0.0–0.7)
Eosinophils Relative: 0.9 % (ref 0.0–5.0)
HCT: 49 % (ref 39.0–52.0)
Hemoglobin: 16.2 g/dL (ref 13.0–17.0)
Lymphocytes Relative: 19.6 % (ref 12.0–46.0)
Lymphs Abs: 1.7 10*3/uL (ref 0.7–4.0)
MCHC: 33.1 g/dL (ref 30.0–36.0)
MCV: 96.4 fl (ref 78.0–100.0)
Monocytes Absolute: 0.6 10*3/uL (ref 0.1–1.0)
Monocytes Relative: 7.3 % (ref 3.0–12.0)
Neutro Abs: 6.3 10*3/uL (ref 1.4–7.7)
Neutrophils Relative %: 71.6 % (ref 43.0–77.0)
Platelets: 309 10*3/uL (ref 150.0–400.0)
RBC: 5.08 Mil/uL (ref 4.22–5.81)
RDW: 12.7 % (ref 11.5–15.5)
WBC: 8.8 10*3/uL (ref 4.0–10.5)

## 2023-01-16 LAB — COMPREHENSIVE METABOLIC PANEL
ALT: 13 U/L (ref 0–53)
AST: 15 U/L (ref 0–37)
Albumin: 4.1 g/dL (ref 3.5–5.2)
Alkaline Phosphatase: 71 U/L (ref 39–117)
BUN: 15 mg/dL (ref 6–23)
CO2: 23 mEq/L (ref 19–32)
Calcium: 9.9 mg/dL (ref 8.4–10.5)
Chloride: 100 mEq/L (ref 96–112)
Creatinine, Ser: 0.91 mg/dL (ref 0.40–1.50)
GFR: 87.33 mL/min (ref >60.00)
Glucose, Bld: 90 mg/dL (ref 70–99)
Potassium: 3.9 mEq/L (ref 3.5–5.1)
Sodium: 137 mEq/L (ref 135–145)
Total Bilirubin: 0.7 mg/dL (ref 0.2–1.2)
Total Protein: 8.3 g/dL (ref 6.0–8.3)

## 2023-01-16 NOTE — Telephone Encounter (Signed)
Pt has a CT scan Friday and requesting that you mark it as STAT.

## 2023-01-16 NOTE — Telephone Encounter (Signed)
Order has been changed to urgent

## 2023-01-17 DIAGNOSIS — L4050 Arthropathic psoriasis, unspecified: Secondary | ICD-10-CM | POA: Diagnosis not present

## 2023-01-19 ENCOUNTER — Ambulatory Visit: Admission: RE | Admit: 2023-01-19 | Payer: Medicare Other | Source: Ambulatory Visit

## 2023-01-19 ENCOUNTER — Telehealth: Payer: Self-pay

## 2023-01-19 ENCOUNTER — Encounter: Payer: Self-pay | Admitting: Family Medicine

## 2023-01-19 DIAGNOSIS — I2510 Atherosclerotic heart disease of native coronary artery without angina pectoris: Secondary | ICD-10-CM | POA: Diagnosis not present

## 2023-01-19 DIAGNOSIS — R1032 Left lower quadrant pain: Secondary | ICD-10-CM

## 2023-01-19 DIAGNOSIS — K5792 Diverticulitis of intestine, part unspecified, without perforation or abscess without bleeding: Secondary | ICD-10-CM | POA: Diagnosis not present

## 2023-01-19 DIAGNOSIS — I700 Atherosclerosis of aorta: Secondary | ICD-10-CM | POA: Diagnosis not present

## 2023-01-19 MED ORDER — LEVOFLOXACIN 750 MG PO TABS: 750.0000 mg | ORAL_TABLET | Freq: Every day | ORAL | 0 refills | Status: DC

## 2023-01-19 MED ORDER — IOPAMIDOL (ISOVUE-300) INJECTION 61%
100.0000 mL | Freq: Once | INTRAVENOUS | Status: AC | PRN
  Administered 2023-01-19: 100 mL via INTRAVENOUS

## 2023-01-19 NOTE — Telephone Encounter (Signed)
I received a call report from Gabe with imaging on CT abdomen pelvis. Impression 1- Inflammatory changes surrounding mid sigmoid colon compatible with acute diverticulitis.

## 2023-01-19 NOTE — Progress Notes (Signed)
Spoke with patient.  He had CT abdomen pelvis.  Even though he is some better clinically still has evidence for acute diverticulitis sigmoid colon.  No complicated features such as abscess.  No evidence for perforation.  Still having some pain but no fever.  Keeping down fluids well.  He took Cipro recently but never took the metronidazole.  We discussed sending in Levaquin 750 mg once daily and go ahead and start the metronidazole and take to completion Follow-up immediately for any fever, progressive abdominal pain, or other concern  Kristian Covey MD [facility]

## 2023-01-22 NOTE — Telephone Encounter (Signed)
Noted  

## 2023-02-02 ENCOUNTER — Ambulatory Visit (INDEPENDENT_AMBULATORY_CARE_PROVIDER_SITE_OTHER): Payer: Medicare Other | Admitting: Family Medicine

## 2023-02-02 ENCOUNTER — Encounter: Payer: Self-pay | Admitting: Family Medicine

## 2023-02-02 VITALS — BP 120/76 | HR 85 | Temp 98.8°F | Wt 173.8 lb

## 2023-02-02 DIAGNOSIS — K5732 Diverticulitis of large intestine without perforation or abscess without bleeding: Secondary | ICD-10-CM | POA: Diagnosis not present

## 2023-02-02 MED ORDER — CEFTRIAXONE SODIUM 1 G IJ SOLR
1.0000 g | Freq: Once | INTRAMUSCULAR | Status: AC
Start: 2023-02-02 — End: 2023-02-02
  Administered 2023-02-02: 1 g via INTRAMUSCULAR

## 2023-02-02 MED ORDER — DOXYCYCLINE HYCLATE 100 MG PO TABS: 100.0000 mg | ORAL_TABLET | Freq: Two times a day (BID) | ORAL | 0 refills | Status: DC

## 2023-02-02 MED ORDER — METRONIDAZOLE 500 MG PO TABS: 500.0000 mg | ORAL_TABLET | Freq: Three times a day (TID) | ORAL | 0 refills | Status: DC

## 2023-02-02 NOTE — Addendum Note (Signed)
Addended by: Carola Rhine on: 02/02/2023 11:57 AM   Modules accepted: Orders

## 2023-02-02 NOTE — Progress Notes (Signed)
   Subjective:    Patient ID: Joshua [name], male    DOB: 27-Nov-1955, 67 y.o.   MRN: [id number]  HPI Here to follow up on diverticulitis. He began to have LLQ pains about 6 weeks ago, and he took a course of Cipro that he had at home. This did not help, so he came in to see Dr. Caryl Never on 01-15-23. He still had LLQ pains and decreased appetite. No nausea or vomiting. No fever. No urinary symptoms. He is passing about 4-5 small mushy stools a day. No blood in the stools. He had labs drawn that day which were normal (WBC was 8.8). A CT scan revealed sigmoid diverticulitis. He was treated with 10 days of Metronidazole 500 mg TID and Levaquin 750 mg daily. These have not helped however, and the abdominal pains have persisted. He has lost about 20 lbs in the past few weeks.    Review of Systems  Constitutional:  Positive for appetite change. Negative for diaphoresis and fever.  Respiratory: Negative.    Cardiovascular: Negative.   Gastrointestinal:  Positive for abdominal pain. Negative for abdominal distention, anal bleeding, blood in stool, constipation, diarrhea, nausea, rectal pain and vomiting.  Genitourinary: Negative.        Objective:   Physical Exam Cardiovascular:     Rate and Rhythm: Normal rate and regular rhythm.     Pulses: Normal pulses.     Heart sounds: Normal heart sounds.  Pulmonary:     Effort: Pulmonary effort is normal.     Breath sounds: Normal breath sounds.  Abdominal:     General: Abdomen is flat. Bowel sounds are normal. There is no distension.     Palpations: Abdomen is soft. There is no mass.     Tenderness: There is no right CVA tenderness, left CVA tenderness, guarding or rebound.     Hernia: No hernia is present.     Comments: Tender in the left flank and LLQ           Assessment & Plan:  Diverticulitis. He is given a shot of Rocephin, and he will take 10 days of Metronidazole and Doxycycline. He will follow up next week. If he is still having pain  at that time, we will get another CT to evaluate for a possible abscess.  Gershon Crane, MD

## 2023-02-06 ENCOUNTER — Telehealth: Payer: Self-pay | Admitting: Family Medicine

## 2023-02-06 NOTE — Telephone Encounter (Signed)
Pt abd pain/diverticulitis has  returning. Pt has an appt with dr fry tomorrow. Pt was told to update dr fry

## 2023-02-07 ENCOUNTER — Ambulatory Visit: Payer: Medicare Other | Admitting: Family Medicine

## 2023-02-08 ENCOUNTER — Encounter: Payer: Self-pay | Admitting: Internal Medicine

## 2023-02-08 ENCOUNTER — Ambulatory Visit: Payer: Medicare Other | Admitting: Internal Medicine

## 2023-02-08 ENCOUNTER — Other Ambulatory Visit (INDEPENDENT_AMBULATORY_CARE_PROVIDER_SITE_OTHER): Payer: Medicare Other

## 2023-02-08 VITALS — BP 110/60 | HR 92 | Ht 68.0 in | Wt 171.0 lb

## 2023-02-08 DIAGNOSIS — K56699 Other intestinal obstruction unspecified as to partial versus complete obstruction: Secondary | ICD-10-CM

## 2023-02-08 DIAGNOSIS — K5732 Diverticulitis of large intestine without perforation or abscess without bleeding: Secondary | ICD-10-CM | POA: Diagnosis not present

## 2023-02-08 LAB — CBC WITH DIFFERENTIAL/PLATELET
Basophils Absolute: 0 10*3/uL (ref 0.0–0.1)
Basophils Relative: 0.7 % (ref 0.0–3.0)
Eosinophils Absolute: 0.1 10*3/uL (ref 0.0–0.7)
Eosinophils Relative: 1.2 % (ref 0.0–5.0)
HCT: 49.4 % (ref 39.0–52.0)
Hemoglobin: 16.6 g/dL (ref 13.0–17.0)
Lymphocytes Relative: 25.7 % (ref 12.0–46.0)
Lymphs Abs: 1.8 10*3/uL (ref 0.7–4.0)
MCHC: 33.6 g/dL (ref 30.0–36.0)
MCV: 93.5 fl (ref 78.0–100.0)
Monocytes Absolute: 0.8 10*3/uL (ref 0.1–1.0)
Monocytes Relative: 10.8 % (ref 3.0–12.0)
Neutro Abs: 4.4 10*3/uL (ref 1.4–7.7)
Neutrophils Relative %: 61.6 % (ref 43.0–77.0)
Platelets: 286 10*3/uL (ref 150.0–400.0)
RBC: 5.28 Mil/uL (ref 4.22–5.81)
RDW: 12.9 % (ref 11.5–15.5)
WBC: 7.2 10*3/uL (ref 4.0–10.5)

## 2023-02-08 MED ORDER — METRONIDAZOLE 500 MG PO TABS: 500.0000 mg | ORAL_TABLET | Freq: Three times a day (TID) | ORAL | 0 refills | Status: DC

## 2023-02-08 MED ORDER — DOXYCYCLINE HYCLATE 100 MG PO TABS: 100.0000 mg | ORAL_TABLET | Freq: Two times a day (BID) | ORAL | 0 refills | Status: DC

## 2023-02-08 NOTE — Patient Instructions (Signed)
Your provider has requested that you go to the basement level for lab work before leaving today. Press "B" on the elevator. The lab is located at the first door on the left as you exit the elevator.  We are placing a referral to [facility] for you to see Dr Marin Olp. Phone # [phone]. They will contact you about an appointment.  Take over the counter Miralax daily.  We have sent the  medications to your pharmacy for you to pick up at your convenience.  Try to get calories from soft bland foods and liquids.  I appreciate the opportunity to care for you. Stan Head, MD, [facility]

## 2023-02-08 NOTE — Telephone Encounter (Signed)
FYI

## 2023-02-08 NOTE — Progress Notes (Addendum)
Joshua [name] 67 y.o. January 05, 1956 [id number]  Assessment & Plan:   Encounter Diagnoses  Name Primary?   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  Presumably this is from diverticulitis.  Significantly improved but he is not worse on antibiotics at this point.  he is not  Correction - not significantly improved but not worse the above should say  Recommendations:  Add daily MiraLAX  Continue doxycycline and metronidazole 2-week extension on prescriptions given  I recheck the CBC today  Increase caloric intake, protein liquid to soft bland diet is much as tolerated.  See Dr. Angelena Form of [facility] surgery, he has seen him before regarding the perianal fistula.  I think he will need a resection of the stricture, however we could need to repeat a CT scan depending upon clinical course.  02/23/2023 follow-up with me  If problems gets severe he is to go to the emergency department (fever, significant bleeding/hemorrhage, severe pain)  Meds ordered this encounter  Medications   doxycycline (VIBRA-TABS) 100 MG tablet    Sig: Take 1 tablet (100 mg total) by mouth 2 (two) times daily for 14 days.    Dispense:  28 tablet    Refill:  0   metroNIDAZOLE (FLAGYL) 500 MG tablet    Sig: Take 1 tablet (500 mg total) by mouth 3 (three) times daily for 14 days.    Dispense:  42 tablet    Refill:  0    Lab Results  Component Value Date   WBC 7.2 02/08/2023   HGB 16.6 02/08/2023   HCT 49.4 02/08/2023   MCV 93.5 02/08/2023   PLT 286.0 02/08/2023   CC: Kristian Covey, MD  Dr. Angelena Form  Subjective:   Chief Complaint:  HPI 67 year old white man with psoriatic arthritis, history of adenomatous colon polyps diverticulosis and diverticular stricture (suspected), as well as hidradenitis suppurativa that caused a perianal fistula.  He presents after developing left  lower quadrant pain suspected diverticulitis, treated with Cipro in July.  He was prescribed Cipro metronidazole but did not take the metronidazole due to a misunderstanding.  He continued to have problems he saw Dr. Caryl Never and was evaluated with a CT scan of the abdomen and pelvis that showed changes consistent with sigmoid diverticulitis.  He was treated with Levaquin as well after that.  Now he is on doxycycline and metronidazole about to finish 10 days course of those.  He is not worse but is not really better.  He is having multiple (10-15) small bowel movements that are like "dirt", usually brown but sometimes there is some white flaky material.  No bleeding.  There is a persistent discomfort.  He has some back pain associated with that at times as well that he says is different than his chronic intermittent low back pain.  He is not lactose intolerant but when he ate some yogurt he got very crampy.  Appetite is off and he is losing some weight as reflected below.  There are no fevers or chills.  He is able to sleep with this.  He is trying recently to increase protein with some nondairy protein drinks.    He had a similar episode in February though it was only short-lived and did not require medical attention.   Wt Readings from Last 3 Encounters:  02/08/23 171 lb (77.6 kg)  02/02/23 173 lb 12.8 oz (78.8 kg)  01/15/23 187 lb (84.8 kg)    CT abdomen pelvis with contrast 01/19/2023 IMPRESSION: 1. Inflammatory changes surrounding the mid sigmoid colon compatible with acute diverticulitis. No complicating features are present. 2. Moderate central canal stenosis at L4-5 secondary to facet hypertrophy. 3. Severe central canal stenosis at L3-4 secondary to a prominent posterior calcified disc. 4. Coronary artery disease. 5. Enlarged prostate gland. 6. Fused anterior osteophytes extend from the thoracic spine to the L3 level compatible with diffuse idiopathic skeletal hyperostosis. 7.  Aortic  Atherosclerosis (ICD10-I70.0).   Colonoscopy 11/04/2020 - One 3 mm polyp in the transverse colon, removed                            with a cold snare. Resected and retrieved.                           - One 1 mm polyp in the cecum, removed with a cold                            biopsy forceps. Resected and retrieved.                           - Post-polypectomy scar in the cecum. Biopsied.                           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 pediatric                            colonoscope required to traverse                           - Diverticulosis in the sigmoid colon.                           - Hemorrhoids found on perianal exam.                           - The examination was otherwise normal on direct                            and retroflexion views.                           - Personal history of colonic polyps - 30 mm TV                            adenoma removed by snare 04/2020. 1. Surgical [P], colon, cecum, polyp (1) - POLYPOID COLONIC MUCOSA WITH NO SPECIFIC HISTOPATHOLOGIC CHANGES - MULTIPLE STEP SECTIONS WERE EXAMINED 2. Surgical [P], colon, cecal scar from previous polypectomy, polyp (1) - COLONIC MUCOSA WITH NO SPECIFIC HISTOPATHOLOGIC CHANGES - NEGATIVE FOR DYSPLASIA 3. Surgical [P], colon, transverse, polyp (1) - SESSILE SERRATED POLYP WITHOUT CYTOLOGIC DYSPLASIA  Colonoscopy 05/04/2020   - Hemorrhoids found on perianal exam. External  hemorrhoids are bleeding - no fistula identified                            today - seen 1 month ago                           - One 30 mm polyp in the cecum, removed piecemeal                            using a hot snare. Resected and retrieved. Clips                            (MR conditional) were placed.                           - Severe diverticulosis in the sigmoid colon. There                            was narrowing of the colon in association with the                             diverticular opening.                           - One diminutive polyp in the sigmoid colon,                            removed with a cold snare. Resected and retrieved.                           - External hemorrhoids.                           - The examination was otherwise normal on direct                            and retroflexion views.                           - Personal history of colonic polyp 7 mm ssp 2015.  1. Surgical [P], colon, cecum, polyp (1) - MULTIPLE FRAGMENTS OF TUBULAR ADENOMA. - NO HIGH GRADE DYSPLASIA OR CARCINOMA. 2. Surgical [P], colon, sigmoid, polyp (1) - INFLAMMATORY POLYP. - NO ADENOMATOUS CHANGE OR CARCINOMA.   Allergies  Allergen Reactions   Adalimumab Other (See Comments)   Penicillin G Other (See Comments)   Penicillins Hives    And fever blisters   Current Meds  Medication Sig   acetaminophen (TYLENOL) 325 MG tablet Take 650 mg by mouth every 6 (six) hours as needed.   BAYER MICROLET LANCETS lancets Check Blood Sugar Once Daily.   clindamycin (CLEOCIN T) 1 % lotion Apply topically daily.   dicyclomine (BENTYL) 20 MG tablet Take 1 tablet (20 mg total) by mouth every 6 (six) hours as needed for spasms.   doxycycline (VIBRA-TABS) 100 MG tablet Take 1 tablet (100 mg total) by mouth 2 (two) times daily.   empagliflozin (JARDIANCE) 10  MG TABS tablet TAKE 1 TABLET BY MOUTH DAILY   hydrocortisone (ANUSOL-HC) 2.5 % rectal cream APPLY RECTALLY TO THE AFFECTED AREA TWICE DAILY   ibuprofen (ADVIL) 800 MG tablet Take 800 mg by mouth 3 (three) times daily as needed.   inFLIXimab-axxq (AVSOLA IV)    metroNIDAZOLE (FLAGYL) 500 MG tablet Take 1 tablet (500 mg total) by mouth 3 (three) times daily.   naproxen sodium (ANAPROX) 220 MG tablet Take 220 mg by mouth 2 (two) times daily as needed.   [facility] ULTRA test strip USE AS DIRECTED TO CHECK BLOOD GLUCOSE ONE TIME DAILY   Past Medical History:  Diagnosis Date   Anal fistula    Hidradenitis    History of  bladder cancer 2009   urologist-- [name]---  s/p TURBT 2010,  recurrence in office 2014 ,  none since per pt   History of colonic polyps    History of COVID-19 04/2019   per pt had covid pneumonia recovered at home, no oxygen, symptoms resolved   History of diverticulitis of colon    History of hypertension 04/05/2009   per the pt he lost 55 lbs and htn has been reversed   History of kidney stones    Hyperlipidemia 04/05/2009   pt states he does not take med for cholesterol   OA (osteoarthritis) 04/05/2009   Psoriatic arthritis (HCC)    rheumonotologist--- [name]   Type 2 diabetes mellitus (HCC) 04/05/2009   followed by pcp---  (01-31-2021  per pt only checks blood sugar once weekly, last fasting sugar-- 122)   Past Surgical History:  Procedure Laterality Date   COLONOSCOPY     last one 11-04-2020  by Gorman Safi   EXTRACORPOREAL SHOCK WAVE LITHOTRIPSY  2015   INCISION AND DRAINAGE ABSCESS N/A 02/03/2021   Procedure: EXCISION OF PERIANAL AND PERINEAL HYDRADENITIS, INTERROGATION OF PERIANAL WOUNDS;  Surgeon: [name], MD;  Location: [facility];  Service: General;  Laterality: N/A;   RECTAL EXAM UNDER ANESTHESIA  02/03/2021   Procedure: ANORECTAL EXAM UNDER ANESTHESIA;  Surgeon: [name], MD;  Location: [facility];  Service: General;;   TONSILLECTOMY     child   TRANSURETHRAL RESECTION OF BLADDER TUMOR  02/2008   Social History   Social History Narrative   Patient is married, a daughter has Crohn's disease   No alcohol or drug use he is an intermittent smoker of cigarettes and cigars   family history includes Arthritis in his father; Crohn's disease in his daughter; Diabetes in his mother; Hypertension in his mother.   Review of Systems As per HPI  Objective:   Physical Exam @BP  110/60   Pulse 92   Ht 5\' 8"  (1.727 m)   Wt 171 lb (77.6 kg)   BMI 26.00 kg/m @  General:  NAD Eyes:   anicteric Lungs:   clear Heart::  S1S2 no rubs, murmurs or gallops Abdomen:  soft and mildly tender left lower quadrant and towards the suprapubic area.  There is some firmness in this area but no palpable mass.  There is no guarding or rebound.  BS+ Ext:   no edema, cyanosis or clubbing    Data Reviewed:  See HPI

## 2023-02-09 ENCOUNTER — Encounter: Payer: Self-pay | Admitting: Family Medicine

## 2023-02-09 ENCOUNTER — Telehealth: Payer: Self-pay | Admitting: Family Medicine

## 2023-02-09 NOTE — Telephone Encounter (Signed)
Pt's spouse called to ask if MD would be willing to provide them with a letter stating Pt could not fly on February 06, 2023 due to a Diverticulitis flare up.   Please return Spouse's call at your earliest convenience.

## 2023-02-09 NOTE — Telephone Encounter (Signed)
Patient's wife informed of the message and voiced understanding

## 2023-02-20 ENCOUNTER — Telehealth: Payer: Self-pay | Admitting: Internal Medicine

## 2023-02-20 ENCOUNTER — Emergency Department (HOSPITAL_COMMUNITY): Payer: Medicare Other

## 2023-02-20 ENCOUNTER — Encounter (HOSPITAL_COMMUNITY): Payer: Self-pay

## 2023-02-20 ENCOUNTER — Other Ambulatory Visit: Payer: Self-pay

## 2023-02-20 ENCOUNTER — Inpatient Hospital Stay (HOSPITAL_COMMUNITY)
Admission: EM | Admit: 2023-02-20 | Discharge: 2023-03-02 | DRG: 329 | Disposition: A | Discharge status: Home Health | Payer: Medicare Other | Source: Ambulatory Visit | Attending: General Surgery | Admitting: General Surgery

## 2023-02-20 DIAGNOSIS — E785 Hyperlipidemia, unspecified: Secondary | ICD-10-CM | POA: Diagnosis not present

## 2023-02-20 DIAGNOSIS — Z880 Allergy status to penicillin: Secondary | ICD-10-CM

## 2023-02-20 DIAGNOSIS — Z8551 Personal history of malignant neoplasm of bladder: Secondary | ICD-10-CM | POA: Diagnosis not present

## 2023-02-20 DIAGNOSIS — Z87442 Personal history of urinary calculi: Secondary | ICD-10-CM | POA: Diagnosis not present

## 2023-02-20 DIAGNOSIS — M1990 Unspecified osteoarthritis, unspecified site: Secondary | ICD-10-CM | POA: Diagnosis not present

## 2023-02-20 DIAGNOSIS — Z8616 Personal history of COVID-19: Secondary | ICD-10-CM | POA: Diagnosis not present

## 2023-02-20 DIAGNOSIS — E43 Unspecified severe protein-calorie malnutrition: Secondary | ICD-10-CM | POA: Diagnosis not present

## 2023-02-20 DIAGNOSIS — Z8701 Personal history of pneumonia (recurrent): Secondary | ICD-10-CM

## 2023-02-20 DIAGNOSIS — Z833 Family history of diabetes mellitus: Secondary | ICD-10-CM | POA: Diagnosis not present

## 2023-02-20 DIAGNOSIS — F17290 Nicotine dependence, other tobacco product, uncomplicated: Secondary | ICD-10-CM | POA: Diagnosis present

## 2023-02-20 DIAGNOSIS — K5732 Diverticulitis of large intestine without perforation or abscess without bleeding: Secondary | ICD-10-CM | POA: Diagnosis present

## 2023-02-20 DIAGNOSIS — R1032 Left lower quadrant pain: Secondary | ICD-10-CM | POA: Diagnosis present

## 2023-02-20 DIAGNOSIS — Z8261 Family history of arthritis: Secondary | ICD-10-CM | POA: Diagnosis not present

## 2023-02-20 DIAGNOSIS — Z933 Colostomy status: Principal | ICD-10-CM

## 2023-02-20 DIAGNOSIS — K5720 Diverticulitis of large intestine with perforation and abscess without bleeding: Principal | ICD-10-CM | POA: Diagnosis present

## 2023-02-20 DIAGNOSIS — Z8249 Family history of ischemic heart disease and other diseases of the circulatory system: Secondary | ICD-10-CM

## 2023-02-20 DIAGNOSIS — I10 Essential (primary) hypertension: Secondary | ICD-10-CM | POA: Diagnosis not present

## 2023-02-20 DIAGNOSIS — E119 Type 2 diabetes mellitus without complications: Secondary | ICD-10-CM | POA: Diagnosis not present

## 2023-02-20 DIAGNOSIS — Z7984 Long term (current) use of oral hypoglycemic drugs: Secondary | ICD-10-CM | POA: Diagnosis not present

## 2023-02-20 DIAGNOSIS — Z8719 Personal history of other diseases of the digestive system: Secondary | ICD-10-CM

## 2023-02-20 DIAGNOSIS — R109 Unspecified abdominal pain: Secondary | ICD-10-CM | POA: Diagnosis not present

## 2023-02-20 DIAGNOSIS — K5730 Diverticulosis of large intestine without perforation or abscess without bleeding: Secondary | ICD-10-CM | POA: Diagnosis not present

## 2023-02-20 DIAGNOSIS — Z888 Allergy status to other drugs, medicaments and biological substances status: Secondary | ICD-10-CM

## 2023-02-20 DIAGNOSIS — Z6824 Body mass index (BMI) 24.0-24.9, adult: Secondary | ICD-10-CM | POA: Diagnosis not present

## 2023-02-20 DIAGNOSIS — K5792 Diverticulitis of intestine, part unspecified, without perforation or abscess without bleeding: Principal | ICD-10-CM

## 2023-02-20 DIAGNOSIS — L4050 Arthropathic psoriasis, unspecified: Secondary | ICD-10-CM | POA: Diagnosis present

## 2023-02-20 DIAGNOSIS — R1084 Generalized abdominal pain: Secondary | ICD-10-CM | POA: Diagnosis not present

## 2023-02-20 LAB — CBC WITH DIFFERENTIAL/PLATELET
Abs Immature Granulocytes: 0.03 10*3/uL (ref 0.00–0.07)
Basophils Absolute: 0.1 10*3/uL (ref 0.0–0.1)
Basophils Relative: 1 %
Eosinophils Absolute: 0 10*3/uL (ref 0.0–0.5)
Eosinophils Relative: 0 %
HCT: 46.5 % (ref 39.0–52.0)
Hemoglobin: 15.4 g/dL (ref 13.0–17.0)
Immature Granulocytes: 0 %
Lymphocytes Relative: 15 %
Lymphs Abs: 1.4 10*3/uL (ref 0.7–4.0)
MCH: 30.7 pg (ref 26.0–34.0)
MCHC: 33.1 g/dL (ref 30.0–36.0)
MCV: 92.8 fL (ref 80.0–100.0)
Monocytes Absolute: 0.6 10*3/uL (ref 0.1–1.0)
Monocytes Relative: 7 %
Neutro Abs: 6.9 10*3/uL (ref 1.7–7.7)
Neutrophils Relative %: 77 %
Platelets: 344 10*3/uL (ref 150–400)
RBC: 5.01 MIL/uL (ref 4.22–5.81)
RDW: 12.2 % (ref 11.5–15.5)
WBC: 9 10*3/uL (ref 4.0–10.5)
nRBC: 0 % (ref 0.0–0.2)

## 2023-02-20 LAB — COMPREHENSIVE METABOLIC PANEL
ALT: 15 U/L (ref 0–44)
AST: 14 U/L — ABNORMAL LOW (ref 15–41)
Albumin: 3.4 g/dL — ABNORMAL LOW (ref 3.5–5.0)
Alkaline Phosphatase: 58 U/L (ref 38–126)
Anion gap: 13 (ref 5–15)
BUN: 13 mg/dL (ref 8–23)
CO2: 23 mmol/L (ref 22–32)
Calcium: 9.1 mg/dL (ref 8.9–10.3)
Chloride: 98 mmol/L (ref 98–111)
Creatinine, Ser: 0.87 mg/dL (ref 0.61–1.24)
GFR, Estimated: >60 mL/min (ref >60)
Glucose, Bld: 212 mg/dL — ABNORMAL HIGH (ref 70–99)
Potassium: 3.7 mmol/L (ref 3.5–5.1)
Sodium: 134 mmol/L — ABNORMAL LOW (ref 135–145)
Total Bilirubin: 0.7 mg/dL (ref 0.3–1.2)
Total Protein: 8.4 g/dL — ABNORMAL HIGH (ref 6.5–8.1)

## 2023-02-20 LAB — CBG MONITORING, ED: Glucose-Capillary: 152 mg/dL — ABNORMAL HIGH (ref 70–99)

## 2023-02-20 LAB — GLUCOSE, CAPILLARY: Glucose-Capillary: 186 mg/dL — ABNORMAL HIGH (ref 70–99)

## 2023-02-20 MED ORDER — POLYETHYLENE GLYCOL 3350 17 G PO PACK: 17.0000 g | PACK | Freq: Every day | ORAL | Status: DC | PRN

## 2023-02-20 MED ORDER — BOOST / RESOURCE BREEZE PO LIQD CUSTOM
1.0000 | Freq: Three times a day (TID) | ORAL | Status: DC
  Administered 2023-02-25 – 2023-02-27 (×2): 1 via ORAL

## 2023-02-20 MED ORDER — METHOCARBAMOL 1000 MG/10ML IJ SOLN: 500.0000 mg | Freq: Three times a day (TID) | INTRAVENOUS | Status: DC | PRN

## 2023-02-20 MED ORDER — ACETAMINOPHEN 325 MG PO TABS
650.0000 mg | ORAL_TABLET | Freq: Four times a day (QID) | ORAL | Status: DC | PRN
  Administered 2023-02-21 – 2023-02-26 (×5): 650 mg via ORAL
  Filled 2023-02-20 (×5): qty 2

## 2023-02-20 MED ORDER — DIPHENHYDRAMINE HCL 12.5 MG/5ML PO ELIX
12.5000 mg | ORAL_SOLUTION | Freq: Four times a day (QID) | ORAL | Status: DC | PRN
  Administered 2023-02-28: 12.5 mg via ORAL
  Filled 2023-02-20: qty 5

## 2023-02-20 MED ORDER — SODIUM CHLORIDE 0.9 % IV SOLN: INTRAVENOUS | Status: DC

## 2023-02-20 MED ORDER — SIMETHICONE 80 MG PO CHEW
40.0000 mg | CHEWABLE_TABLET | Freq: Four times a day (QID) | ORAL | Status: DC | PRN
  Administered 2023-02-22 – 2023-03-02 (×4): 40 mg via ORAL
  Filled 2023-02-20 (×4): qty 1

## 2023-02-20 MED ORDER — INSULIN ASPART 100 UNIT/ML IJ SOLN
0.0000 [IU] | Freq: Three times a day (TID) | INTRAMUSCULAR | Status: DC
  Administered 2023-02-20: 3 [IU] via SUBCUTANEOUS
  Administered 2023-02-21: 5 [IU] via SUBCUTANEOUS
  Administered 2023-02-22 (×2): 2 [IU] via SUBCUTANEOUS
  Administered 2023-02-22 – 2023-02-24 (×4): 3 [IU] via SUBCUTANEOUS
  Administered 2023-02-24 – 2023-02-25 (×2): 2 [IU] via SUBCUTANEOUS
  Administered 2023-02-25 – 2023-02-26 (×2): 5 [IU] via SUBCUTANEOUS
  Administered 2023-02-27 (×3): 3 [IU] via SUBCUTANEOUS
  Administered 2023-02-28 – 2023-03-01 (×4): 2 [IU] via SUBCUTANEOUS
  Administered 2023-03-01: 3 [IU] via SUBCUTANEOUS
  Administered 2023-03-01 – 2023-03-02 (×2): 5 [IU] via SUBCUTANEOUS
  Administered 2023-03-02: 2 [IU] via SUBCUTANEOUS
  Filled 2023-02-20: qty 0.15

## 2023-02-20 MED ORDER — ONDANSETRON HCL 4 MG/2ML IJ SOLN: 4.0000 mg | Freq: Four times a day (QID) | INTRAMUSCULAR | Status: DC | PRN

## 2023-02-20 MED ORDER — SODIUM CHLORIDE 0.9 % IV SOLN
1.0000 g | INTRAVENOUS | Status: AC
  Administered 2023-02-20: 1 g via INTRAVENOUS
  Filled 2023-02-20: qty 20

## 2023-02-20 MED ORDER — METHOCARBAMOL 500 MG PO TABS
500.0000 mg | ORAL_TABLET | Freq: Three times a day (TID) | ORAL | Status: DC | PRN
  Administered 2023-02-21 – 2023-02-27 (×10): 500 mg via ORAL
  Filled 2023-02-20 (×10): qty 1

## 2023-02-20 MED ORDER — ENOXAPARIN SODIUM 40 MG/0.4ML IJ SOSY
40.0000 mg | PREFILLED_SYRINGE | INTRAMUSCULAR | Status: DC
  Administered 2023-02-20 – 2023-03-01 (×10): 40 mg via SUBCUTANEOUS
  Filled 2023-02-20 (×10): qty 0.4

## 2023-02-20 MED ORDER — INSULIN ASPART 100 UNIT/ML IJ SOLN
0.0000 [IU] | Freq: Every day | INTRAMUSCULAR | Status: DC
  Administered 2023-03-01: 3 [IU] via SUBCUTANEOUS
  Filled 2023-02-20: qty 0.05

## 2023-02-20 MED ORDER — PROCHLORPERAZINE EDISYLATE 10 MG/2ML IJ SOLN: 5.0000 mg | Freq: Four times a day (QID) | INTRAMUSCULAR | Status: DC | PRN

## 2023-02-20 MED ORDER — IOHEXOL 300 MG/ML  SOLN
100.0000 mL | Freq: Once | INTRAMUSCULAR | Status: AC | PRN
  Administered 2023-02-20: 100 mL via INTRAVENOUS

## 2023-02-20 MED ORDER — ACETAMINOPHEN 650 MG RE SUPP: 650.0000 mg | Freq: Four times a day (QID) | RECTAL | Status: DC | PRN

## 2023-02-20 MED ORDER — PROCHLORPERAZINE MALEATE 10 MG PO TABS: 10.0000 mg | ORAL_TABLET | Freq: Four times a day (QID) | ORAL | Status: DC | PRN

## 2023-02-20 MED ORDER — OXYCODONE HCL 5 MG PO TABS
5.0000 mg | ORAL_TABLET | ORAL | Status: DC | PRN
  Administered 2023-02-20 – 2023-02-26 (×11): 5 mg via ORAL
  Filled 2023-02-20 (×6): qty 1
  Filled 2023-02-20: qty 2
  Filled 2023-02-20 (×4): qty 1

## 2023-02-20 MED ORDER — DIPHENHYDRAMINE HCL 50 MG/ML IJ SOLN
12.5000 mg | Freq: Four times a day (QID) | INTRAMUSCULAR | Status: DC | PRN
  Administered 2023-02-26 – 2023-02-27 (×2): 12.5 mg via INTRAVENOUS
  Filled 2023-02-20 (×2): qty 1

## 2023-02-20 MED ORDER — SODIUM CHLORIDE 0.9 % IV SOLN
1.0000 g | Freq: Three times a day (TID) | INTRAVENOUS | Status: AC
  Administered 2023-02-21 – 2023-02-27 (×19): 1 g via INTRAVENOUS
  Filled 2023-02-20 (×22): qty 20

## 2023-02-20 MED ORDER — ONDANSETRON HCL 4 MG/2ML IJ SOLN
4.0000 mg | Freq: Once | INTRAMUSCULAR | Status: AC
  Administered 2023-02-20: 4 mg via INTRAVENOUS
  Filled 2023-02-20: qty 2

## 2023-02-20 MED ORDER — METOPROLOL TARTRATE 5 MG/5ML IV SOLN: 5.0000 mg | Freq: Four times a day (QID) | INTRAVENOUS | Status: DC | PRN

## 2023-02-20 MED ORDER — MORPHINE SULFATE (PF) 2 MG/ML IV SOLN: 2.0000 mg | INTRAVENOUS | Status: DC | PRN

## 2023-02-20 MED ORDER — HYDROMORPHONE HCL 1 MG/ML IJ SOLN
1.0000 mg | Freq: Once | INTRAMUSCULAR | Status: AC
  Administered 2023-02-20: 1 mg via INTRAVENOUS
  Filled 2023-02-20: qty 1

## 2023-02-20 MED ORDER — ONDANSETRON 4 MG PO TBDP: 4.0000 mg | ORAL_TABLET | Freq: Four times a day (QID) | ORAL | Status: DC | PRN

## 2023-02-20 MED ORDER — SODIUM CHLORIDE 0.9 % IV BOLUS
1000.0000 mL | Freq: Once | INTRAVENOUS | Status: AC
  Administered 2023-02-20: 1000 mL via INTRAVENOUS

## 2023-02-20 NOTE — Telephone Encounter (Signed)
Inbound call from patient's wife, wished to advise Dr. Leone Payor that he was admitted into the hospital per recommendations from Dr. Cliffton Asters. They requested to cancel his follow up for 9/20 due to being hospitalized.

## 2023-02-20 NOTE — ED Provider Notes (Signed)
[place] EMERGENCY DEPARTMENT AT [facility] Provider Note   CSN: [id number] Arrival date & time: 02/20/23  1105     History  Chief complaint: Abdominal pain  Joshua [name] is a 67 y.o. male.  HPI   Patient states he has been having trouble with diverticulitis and abdominal pain since August.  Patient initially saw his primary care doctor back in August of this year.  Patient had a CT scan that showed evidence of acute diverticulitis.  Patient was started on antibiotics.  Patient states he has been continuing to take antibiotics but his abdominal pain has persisted.  Patient also had noticed a change in his stool caliber.  He followed up with his neurologist Dr. Leone Payor.  Patient was continued on antibiotics including doxycycline and metronidazole.  Patient does have weight loss associated with his abdominal pain.  Patient was felt to have an inflammatory diverticular stricture.  Patient states he went to see the surgeon in the office today.  They instructed him to come to the ED to be admitted to the hospital for antibiotics and likely he will need surgical resection  Home Medications Prior to Admission medications   Medication Sig Start Date End Date Taking? Authorizing Provider  acetaminophen (TYLENOL) 325 MG tablet Take 650 mg by mouth every 6 (six) hours as needed.    [provider]  BAYER MICROLET LANCETS lancets Check Blood Sugar Once Daily. 02/21/16   Burchette, Elberta Fortis, MD  clindamycin (CLEOCIN T) 1 % lotion Apply topically daily. 03/07/21   [provider]  dicyclomine (BENTYL) 20 MG tablet Take 1 tablet (20 mg total) by mouth every 6 (six) hours as needed for spasms. 12/11/22   Burchette, Elberta Fortis, MD  doxycycline (VIBRA-TABS) 100 MG tablet Take 1 tablet (100 mg total) by mouth 2 (two) times daily for 14 days. 02/08/23 02/22/23  Iva Boop, MD  empagliflozin (JARDIANCE) 10 MG TABS tablet TAKE 1 TABLET BY MOUTH DAILY 01/16/23   Nelwyn Salisbury, MD   hydrocortisone (ANUSOL-HC) 2.5 % rectal cream APPLY RECTALLY TO THE AFFECTED AREA TWICE DAILY 01/04/21   Zehr, Princella Pellegrini, PA-C  ibuprofen (ADVIL) 800 MG tablet Take 800 mg by mouth 3 (three) times daily as needed. 10/11/20   [provider]  inFLIXimab-axxq (AVSOLA IV)  06/28/21   [provider]  metroNIDAZOLE (FLAGYL) 500 MG tablet Take 1 tablet (500 mg total) by mouth 3 (three) times daily for 14 days. 02/08/23 02/22/23  Iva Boop, MD  naproxen sodium (ANAPROX) 220 MG tablet Take 220 mg by mouth 2 (two) times daily as needed.    [provider]  [facility] ULTRA test strip USE AS DIRECTED TO CHECK BLOOD GLUCOSE ONE TIME DAILY 08/29/19   Burchette, Elberta Fortis, MD      Allergies    Adalimumab, Penicillin g, and Penicillins    Review of Systems   Review of Systems  Physical Exam Updated Vital Signs BP 124/68 (BP Location: Right Arm)   Pulse 79   Temp 98.9 F (37.2 C) (Oral)   Resp 16   Ht 1.727 m (5\' 8" )   Wt 74.4 kg   SpO2 100%   BMI 24.94 kg/m  Physical Exam Vitals and nursing note reviewed.  Constitutional:      General: He is not in acute distress.    Appearance: He is well-developed.  HENT:     Head: Normocephalic and atraumatic.     Right Ear: External ear normal.  Left Ear: External ear normal.  Eyes:     General: No scleral icterus.       Right eye: No discharge.        Left eye: No discharge.     Conjunctiva/sclera: Conjunctivae normal.  Neck:     Trachea: No tracheal deviation.  Cardiovascular:     Rate and Rhythm: Normal rate and regular rhythm.  Pulmonary:     Effort: Pulmonary effort is normal. No respiratory distress.     Breath sounds: Normal breath sounds. No stridor. No wheezing or rales.  Abdominal:     General: Bowel sounds are normal. There is no distension.     Palpations: Abdomen is soft.     Tenderness: There is abdominal tenderness in the left lower quadrant. There is no guarding or rebound.  Musculoskeletal:         General: No tenderness or deformity.     Cervical back: Neck supple.  Skin:    General: Skin is warm and dry.     Findings: No rash.  Neurological:     General: No focal deficit present.     Mental Status: He is alert.     Cranial Nerves: No cranial nerve deficit, dysarthria or facial asymmetry.     Sensory: No sensory deficit.     Motor: No abnormal muscle tone or seizure activity.     Coordination: Coordination normal.  Psychiatric:        Mood and Affect: Mood normal.     ED Results / Procedures / Treatments   Labs (all labs ordered are listed, but only abnormal results are displayed) Labs Reviewed  COMPREHENSIVE METABOLIC PANEL - Abnormal; Notable for the following components:      Result Value   Sodium 134 (*)    Glucose, Bld 212 (*)    Total Protein 8.4 (*)    Albumin 3.4 (*)    AST 14 (*)    All other components within normal limits  CBC WITH DIFFERENTIAL/PLATELET    EKG None  Radiology No results found.  Procedures Procedures    Medications Ordered in ED Medications  sodium chloride 0.9 % bolus 1,000 mL (1,000 mLs Intravenous New Bag/Given 02/20/23 1522)  HYDROmorphone (DILAUDID) injection 1 mg (1 mg Intravenous Given 02/20/23 1521)  ondansetron (ZOFRAN) injection 4 mg (4 mg Intravenous Given 02/20/23 1521)  iohexol (OMNIPAQUE) 300 MG/ML solution 100 mL (100 mLs Intravenous Contrast Given 02/20/23 1537)    ED Course/ Medical Decision Making/ A&P Clinical Course as of 02/20/23 1633  Tue Feb 20, 2023  1629 CBC normal.  Metabolic panel normal [JK]  1630 CT reviewed by me, formal result pending. Suggestive of diverticulitis.  Pt seen by Gen surg.  Will plan on admission to the hospital for further treatment. [JK]    Clinical Course User Index [JK] Linwood Dibbles, MD                                 Medical Decision Making Problems Addressed: Diverticulitis: acute illness or injury that poses a threat to life or bodily functions  Amount and/or Complexity of  Data Reviewed Labs: ordered. Decision-making details documented in ED Course. Radiology: ordered and independent interpretation performed.  Risk Prescription drug management. Decision regarding hospitalization.  Patient presented to the ED for evaluation of persistent abdominal pain.  Patient recently diagnosed with diverticulitis.  He has been on several rounds of antibiotics but his symptoms have  persisted.  Patient was seen in the surgery clinic today and was sent to the ED for probable hospitalization and further treatment.  CT scan suggests persistent diverticulitis.  Formidable radiology interpretation is still pending.  Patient was seen by the surgery team in the ED.  They will plan admitting to the hospital and will be ordering IV antibiotics and further treatment.  Patient will likely end up requiring surgical intervention       Final Clinical Impression(s) / ED Diagnoses Final diagnoses:  Diverticulitis    Rx / DC Orders ED Discharge Orders     None         Linwood Dibbles, MD 02/20/23 [phone]

## 2023-02-20 NOTE — ED Provider Triage Note (Signed)
Emergency Medicine Provider Triage Evaluation Note  Joshua [name] , a 67 y.o. male  was evaluated in triage.  Pt complains of abdominal pain and weight loss. Pt sent in by Dr. Cliffton Asters General surgeon  Review of Systems  Positive: diarrhea Negative: vomiting  Physical Exam  BP 136/89 (BP Location: Right Arm)   Pulse (!) 110   Temp 98.3 F (36.8 C) (Oral)   Resp 16   Ht 5\' 8"  (1.727 m)   Wt 74.4 kg   SpO2 99%   BMI 24.94 kg/m  Gen:   Awake, no distress   Resp:  Normal effort  MSK:   Moves extremities without difficulty  Other:    Medical Decision Making  Medically screening exam initiated at 11:28 AM.  Appropriate orders placed.  Joshua [name] was informed that the remainder of the evaluation will be completed by another provider, this initial triage assessment does not replace that evaluation, and the importance of remaining in the ED until their evaluation is complete.     Elson Areas, [place] 02/20/23 1129

## 2023-02-20 NOTE — ED Notes (Signed)
Pt provided with a clear diet meal.

## 2023-02-20 NOTE — Progress Notes (Signed)
Pharmacy Antibiotic Note  Joshua [name] is a 67 y.o. male admitted on 02/20/2023 with sigmoid diverticulitis. Pharmacy has been consulted for Meropenem dosing.  Plan: Meropenem 1g IV q8h Monitor renal function, cultures, clinical course  Height: 5\' 8"  (172.7 cm) Weight: 74.4 kg (164 lb) IBW/kg (Calculated) : 68.4  Temp (24hrs), Avg:98.6 F (37 C), Min:98.3 F (36.8 C), Max:98.9 F (37.2 C)  Recent Labs  Lab 02/20/23 1148  WBC 9.0  CREATININE 0.87    Estimated Creatinine Clearance: 79.7 mL/min (by C-G formula based on SCr of 0.87 mg/dL).    Allergies  Allergen Reactions   Adalimumab Other (See Comments)   Penicillin G Other (See Comments)   Penicillins Hives    And fever blisters    Antimicrobials this admission: 9/17 Meropenem >>  Dose adjustments this admission: --  Microbiology results: None ordered  Thank you for allowing pharmacy to be a part of this patient's care.    Greer Pickerel, PharmD, BCPS Clinical Pharmacist 02/20/2023 5:25 PM

## 2023-02-20 NOTE — Consult Note (Signed)
[facility] Admission Note  Joshua [name] September 02, 1955  [id number].    Requesting MD: Linwood Dibbles Chief Complaint/Reason for Consult: diverticulitis   HPI:  Joshua [name] is a 67 y.o. 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 Cliffton Asters in our office. He has been having worsening abdominal pain over the last 3 weeks. He had a CT scan 01/19/23 that showed inflammatory changes surrounding the mid sigmoid colon compatible with acute diverticulitis. He has completed courses of cipro/flagyl, levaquin, and doxycycline but continues to hurt. Pain is LLQ. Worse with PO intake or after a bowel movement. Some nausea, no emesis. Poor appetite and he has noticed a 30lb weight loss since July. Initially had some constipation that improved with miralax. Denies blood in stool. States that this has probably been going on since March, but he was initially able to mitigate his symptoms at home with bowel rest.  In the ED today he is found to be hemodynamically stable. WBC 9.0. CT scan is pending.  Last colonoscopy 11/04/20: intrinsic stenosis was found in the distal sigmoid colon and was traversed, diverticula in the sigmoid colon Abdominal surgical history: none Anticoagulants: none Smokes cigars on the weekend Drinks alcohol occasionally    Family History  Problem Relation Age of Onset   Diabetes Mother    Hypertension Mother    Arthritis Father    Crohn's disease Daughter    Esophageal cancer Neg Hx    Colon cancer Neg Hx    Pancreatic cancer Neg Hx    Stomach cancer Neg Hx     Past Medical History:  Diagnosis Date   Anal fistula    Hidradenitis    History of bladder cancer 2009   urologist-- [name]---  s/p TURBT 2010,  recurrence in office 2014 ,  none since per pt   History of colonic polyps    History of COVID-19  04/2019   per pt had covid pneumonia recovered at home, no oxygen, symptoms resolved   History of diverticulitis of colon    History of hypertension 04/05/2009   per the pt he lost 55 lbs and htn has been reversed   History of kidney stones    Hyperlipidemia 04/05/2009   pt states he does not take med for cholesterol   OA (osteoarthritis) 04/05/2009   Psoriatic arthritis (HCC)    rheumonotologist--- [name]   Type 2 diabetes mellitus (HCC) 04/05/2009   followed by pcp---  (01-31-2021  per pt only checks blood sugar once weekly, last fasting sugar-- 122)    Past Surgical History:  Procedure Laterality Date   COLONOSCOPY     last one 11-04-2020  by gessner   EXTRACORPOREAL SHOCK WAVE LITHOTRIPSY  2015   INCISION AND DRAINAGE ABSCESS N/A 02/03/2021   Procedure: EXCISION OF PERIANAL AND PERINEAL HYDRADENITIS, INTERROGATION OF PERIANAL WOUNDS;  Surgeon: [name], MD;  Location: [facility];  Service: General;  Laterality: N/A;   RECTAL EXAM UNDER ANESTHESIA  02/03/2021   Procedure: ANORECTAL EXAM UNDER ANESTHESIA;  Surgeon: [name], MD;  Location: [facility];  Service: General;;   TONSILLECTOMY     child   TRANSURETHRAL RESECTION OF BLADDER TUMOR  02/2008    Social History:  reports that he has been smoking cigarettes and cigars. He has never used smokeless tobacco. He reports that he  does not currently use alcohol. He reports that he does not use drugs.  Allergies:  Allergies  Allergen Reactions   Adalimumab Other (See Comments)   Penicillin G Other (See Comments)   Penicillins Hives    And fever blisters    (Not in a hospital admission)   Prior to Admission medications   Medication Sig Start Date End Date Taking? Authorizing Provider  acetaminophen (TYLENOL) 325 MG tablet Take 650 mg by mouth every 6 (six) hours as needed.    [provider]  BAYER MICROLET LANCETS lancets Check Blood Sugar Once Daily.  02/21/16   Burchette, Elberta Fortis, MD  clindamycin (CLEOCIN T) 1 % lotion Apply topically daily. 03/07/21   [provider]  dicyclomine (BENTYL) 20 MG tablet Take 1 tablet (20 mg total) by mouth every 6 (six) hours as needed for spasms. 12/11/22   Burchette, Elberta Fortis, MD  doxycycline (VIBRA-TABS) 100 MG tablet Take 1 tablet (100 mg total) by mouth 2 (two) times daily for 14 days. 02/08/23 02/22/23  Iva Boop, MD  empagliflozin (JARDIANCE) 10 MG TABS tablet TAKE 1 TABLET BY MOUTH DAILY 01/16/23   Nelwyn Salisbury, MD  hydrocortisone (ANUSOL-HC) 2.5 % rectal cream APPLY RECTALLY TO THE AFFECTED AREA TWICE DAILY 01/04/21   Zehr, Princella Pellegrini, PA-C  ibuprofen (ADVIL) 800 MG tablet Take 800 mg by mouth 3 (three) times daily as needed. 10/11/20   [provider]  inFLIXimab-axxq (AVSOLA IV)  06/28/21   [provider]  metroNIDAZOLE (FLAGYL) 500 MG tablet Take 1 tablet (500 mg total) by mouth 3 (three) times daily for 14 days. 02/08/23 02/22/23  Iva Boop, MD  naproxen sodium (ANAPROX) 220 MG tablet Take 220 mg by mouth 2 (two) times daily as needed.    [provider]  [facility] ULTRA test strip USE AS DIRECTED TO CHECK BLOOD GLUCOSE ONE TIME DAILY 08/29/19   Burchette, Elberta Fortis, MD    Blood pressure 124/68, pulse 79, temperature 98.9 F (37.2 C), temperature source Oral, resp. rate 16, height 5\' 8"  (1.727 m), weight 74.4 kg, SpO2 100%. Physical Exam: General: pleasant, WD/WN male who is laying in bed in NAD HEENT: head is normocephalic, atraumatic.  Sclera are noninjected.  Pupils equal and round.  Ears and nose without any masses or lesions.  Mouth is pink and moist. Dentition fair Heart: regular, rate, and rhythm.  Normal s1,s2. No obvious murmurs, gallops, or rubs noted.  Palpable radial and pedal pulses bilaterally  Lungs: CTAB, no wheezes, rhonchi, or rales noted.  Respiratory effort nonlabored Abd: soft, ND, +BS, no masses, hernias, or organomegaly. Significant tenderness in  the LLQ, otherwise abdomen nontender, no diffuse peritonitis MS: no BUE/BLE edema, calves soft and nontender Skin: warm and dry with no masses, lesions, or rashes Psych: A&Ox4 with an appropriate affect Neuro: MAEs, no gross motor or sensory deficits BUE/BLE  Results for orders placed or performed during the hospital encounter of 02/20/23 (from the past 48 hour(s))  CBC with Differential     Status: None   Collection Time: 02/20/23 11:48 AM  Result Value Ref Range   WBC 9.0 4.0 - 10.5 K/uL   RBC 5.01 4.22 - 5.81 MIL/uL   Hemoglobin 15.4 13.0 - 17.0 g/dL   HCT 57.8 46.9 - 62.9 %   MCV 92.8 80.0 - 100.0 fL   MCH 30.7 26.0 - 34.0 pg   MCHC 33.1 30.0 - 36.0 g/dL   RDW 52.8 41.3 - 24.4 %  Platelets 344 150 - 400 K/uL   nRBC 0.0 0.0 - 0.2 %   Neutrophils Relative % 77 %   Neutro Abs 6.9 1.7 - 7.7 K/uL   Lymphocytes Relative 15 %   Lymphs Abs 1.4 0.7 - 4.0 K/uL   Monocytes Relative 7 %   Monocytes Absolute 0.6 0.1 - 1.0 K/uL   Eosinophils Relative 0 %   Eosinophils Absolute 0.0 0.0 - 0.5 K/uL   Basophils Relative 1 %   Basophils Absolute 0.1 0.0 - 0.1 K/uL   Immature Granulocytes 0 %   Abs Immature Granulocytes 0.03 0.00 - 0.07 K/uL    Comment: Performed at [facility], [address], [place], [place] [address]  Comprehensive metabolic panel     Status: Abnormal   Collection Time: 02/20/23 11:48 AM  Result Value Ref Range   Sodium 134 (L) 135 - 145 mmol/L   Potassium 3.7 3.5 - 5.1 mmol/L   Chloride 98 98 - 111 mmol/L   CO2 23 22 - 32 mmol/L   Glucose, Bld 212 (H) 70 - 99 mg/dL    Comment: Glucose reference range applies only to samples taken after fasting for at least 8 hours.   BUN 13 8 - 23 mg/dL   Creatinine, Ser 3.08 0.61 - 1.24 mg/dL   Calcium 9.1 8.9 - 65.7 mg/dL   Total Protein 8.4 (H) 6.5 - 8.1 g/dL   Albumin 3.4 (L) 3.5 - 5.0 g/dL   AST 14 (L) 15 - 41 U/L   ALT 15 0 - 44 U/L   Alkaline Phosphatase 58 38 - 126 U/L   Total Bilirubin 0.7 0.3 -  1.2 mg/dL   GFR, Estimated >84 >69 mL/min    Comment: (NOTE) Calculated using the CKD-EPI Creatinine Equation (2021)    Anion gap 13 5 - 15    Comment: Performed at [facility], [address] [place], [place] [address]   No results found.    Assessment/Plan Sigmoid diverticulitis  - Patient with ongoing diverticulitis that failed outpatient management with multiple rounds of oral antibiotics. CT report pending but does show a significantly inflamed/thickened sigmoid colon. No role for acute surgical intervention, but will admit patient to the hospital for IV antibiotics. Ok for liquid diet. If he does not improve quickly likely will require partial colectomy/colostomy this admission.   ID - meropenem VTE - SCDs, lovenox FEN - IVF, FLD, NPO after MN Foley - none  Hx sigmoid diverticular stricture  Psoriatic arthritis on Remicade  Hidradenitis suppurative Hx bladder cancer  I reviewed ED provider notes, last 24 h vitals and pain scores, last 24 h labs and trends, and last 24 h imaging results.   Franne Forts, PA-C [facility] 02/20/2023, 3:37 PM Please see Amion for pager number during day hours 7:00am-4:30pm

## 2023-02-20 NOTE — Telephone Encounter (Signed)
Noted  

## 2023-02-20 NOTE — ED Triage Notes (Signed)
Pt arrived with abdominal pain for a few months. Reports Diverticulitis, placed on antibiotics ,seen G.I doc 2 weeks ago, advised if pain doesn't get better go to Ed. No other symptoms

## 2023-02-21 LAB — GLUCOSE, CAPILLARY
Glucose-Capillary: 119 mg/dL — ABNORMAL HIGH (ref 70–99)
Glucose-Capillary: 138 mg/dL — ABNORMAL HIGH (ref 70–99)
Glucose-Capillary: 204 mg/dL — ABNORMAL HIGH (ref 70–99)
Glucose-Capillary: 111 mg/dL — ABNORMAL HIGH (ref 70–99)

## 2023-02-21 MED ORDER — ADULT MULTIVITAMIN W/MINERALS CH
1.0000 | ORAL_TABLET | Freq: Every day | ORAL | Status: DC
  Administered 2023-02-22 – 2023-03-01 (×7): 1 via ORAL
  Filled 2023-02-21 (×8): qty 1

## 2023-02-21 NOTE — Progress Notes (Signed)
   02/21/23 1513  TOC Brief Assessment  Insurance and Status Reviewed  Patient has primary care physician Yes  Home environment has been reviewed home with spouse  Prior level of function: independent  Prior/Current Home Services No current home services  Social Determinants of Health Reivew SDOH reviewed no interventions necessary  Readmission risk has been reviewed Yes  Transition of care needs no transition of care needs at this time

## 2023-02-21 NOTE — Progress Notes (Addendum)
Initial Nutrition Assessment  DOCUMENTATION CODES:   Severe malnutrition in context of chronic illness  INTERVENTION:  - Clear Liquid diet per Surgery.  - Recommend [employer] 1.4 PO BID once medically appropriate, each supplement provides 455 kcal and 20 grams protein. - Encourage intake as tolerated.  - Multivitamin with minerals daily - Monitor weight trends.   NUTRITION DIAGNOSIS:   Severe Malnutrition related to chronic illness as evidenced by energy intake < or equal to 75% for > or equal to 1 month, percent weight loss (18% in 6 months).  GOAL:   Patient will meet greater than or equal to 90% of their needs  MONITOR:   PO intake, Supplement acceptance, Diet advancement, Weight trends  REASON FOR ASSESSMENT:   Malnutrition Screening Tool    ASSESSMENT:   67 y.o. 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 Admitted for sigmoid diverticulitis.     Spoke with patient via bedside telephone.  He reports a recent UBW of 187# and significant weight loss since August due to "stomach issues". However, notes he was higher earlier this year and has been losing slowly for several months with escalation of loss in August.  Per EMR, patient weighed at 199# in March and now weighed at 164#. This is a 35# or 18% in 6 months, which is significant for the time frame.   Over the past 2 months he shares he has been eating very little. Has had a poor appetite and has not been tolerating food well. Admits each day is different and he has "good" and "bad" days.  On a good day he will eat soft foods such as meatloaf and mashed potatoes, toast, or chicken with rice. On bad days, he often only has liquids such as juice or broth or sometimes a soft boiled egg. Has tried Ensure but states the milk solids in tradition supplements really  bother his stomach so switched to a plant based protein powder he blends with bananas and blueberries. Notes he does well with these.  Patient admits some days he doesn't want to eat anything but his wife has been encouraging him to eat.  Current appetite remains very poor but he endorses trying to consume 3 meals a day. Has been on a clear liquid diet and endorses having had broth, grape juice, and an [place] ice for lunch today.  Encouraged patient to continue to try to eat something at all 3 meals daily as tolerated.  He is agreeable to try Molli Posey for a plant based nutrition supplement.   Medications reviewed and include: -  Labs reviewed:  HA1C 7.8 Blood Glucose 119-186 since admit yesterday   NUTRITION - FOCUSED PHYSICAL EXAM:  RD working remotely  Diet Order:   Diet Order             Diet clear liquid Fluid consistency: Thin  Diet effective now                   EDUCATION NEEDS:  Education needs have been addressed  Skin:  Skin Assessment: Reviewed RN Assessment  Last BM:  9/17  Height:  Ht Readings from Last 1 Encounters:  02/20/23 5\' 8"  (1.727 m)   Weight:  Wt Readings from Last 1 Encounters:  02/20/23 74.4 kg    BMI:  Body mass index is 24.94 kg/m.  Estimated Nutritional Needs:  Kcal:  2100-2250 kcals Protein:  90-105 grams Fluid:  >/= 2.1L    Shelle Iron RD, LDN For contact information, refer to [facility].

## 2023-02-21 NOTE — Progress Notes (Signed)
Subjective: CC: We reviewed his history.  Reports 3 weeks of ongoing llq abdominal pain that has not improved despite oral abx. His pain usually resolves in the am but returns after he has a BM and is worsened by PO intake. Today he again awoke without pain. He did have a small liquid bm this am and feels is LLQ pain is better than was it normally is after a BM. He has lost 30lbs since July. Has been taking Miralax and having multiple liquid bm's daily. 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 WBC 7 Objective: Vital signs in last 24 hours: Temp:  [97.9 F (36.6 C)-99.1 F (37.3 C)] 98 F (36.7 C) (09/18 0922) Pulse Rate:  [63-82] 69 (09/18 0922) Resp:  [16-18] 18 (09/18 0922) BP: (113-135)/(64-68) 114/65 (09/18 0922) SpO2:  [100 %] 100 % (09/18 0922) Last BM Date : 02/20/23  Intake/Output from previous day: 09/17 0701 - 09/18 0700 In: 2128.2 [P.O.:240; I.V.:888.2; IV Piggyback:1000] Out: 300 [Urine:300] Intake/Output this shift: Total I/O In: 568.7 [I.V.:368.7; IV Piggyback:200] Out: -   PE: Gen:  Alert, NAD, pleasant Card:  Reg Pulm:  Rate and effort normal Abd: Soft, ND, mild LLQ ttp without rigidity or guarding, +BS Psych: A&Ox3   Lab Results:  Recent Labs    02/20/23 1148 02/21/23 0359  WBC 9.0 7.0  HGB 15.4 13.6  HCT 46.5 41.3  PLT 344 310   BMET Recent Labs    02/20/23 1148 02/21/23 0359  NA 134* 136  K 3.7 3.9  CL 98 102  CO2 23 27  GLUCOSE 212* 120*  BUN 13 10  CREATININE 0.87 0.83  CALCIUM 9.1 8.7*   PT/INR No results for input(s): "LABPROT", "INR" in the last 72 hours. CMP     Component Value Date/Time   NA 136 02/21/2023 0359   K 3.9 02/21/2023 0359   CL 102 02/21/2023 0359   CO2 27 02/21/2023 0359   GLUCOSE 120 (H) 02/21/2023 0359   BUN 10 02/21/2023 0359   CREATININE 0.83 02/21/2023 0359   CALCIUM 8.7 (L) 02/21/2023 0359   PROT 8.4 (H)  02/20/2023 1148   ALBUMIN 3.4 (L) 02/20/2023 1148   AST 14 (L) 02/20/2023 1148   ALT 15 02/20/2023 1148   ALKPHOS 58 02/20/2023 1148   BILITOT 0.7 02/20/2023 1148   GFRNONAA >60 02/21/2023 0359   GFRAA >60 05/01/2019 2310   Lipase  No results found for: "LIPASE"  Studies/Results: CT ABDOMEN PELVIS W CONTRAST  Result Date: 02/20/2023 CLINICAL DATA:  Abdominal pain and history of diverticulitis. EXAM: CT ABDOMEN AND PELVIS WITH CONTRAST TECHNIQUE: Multidetector CT imaging of the abdomen and pelvis was performed using the standard protocol following bolus administration of intravenous contrast. RADIATION DOSE REDUCTION: This exam was performed according to the departmental dose-optimization program which includes automated exposure control, adjustment of the mA and/or kV according to patient size and/or use of iterative reconstruction technique. CONTRAST:  OMNIPAQUE IOHEXOL 300 MG/ML  SOLN COMPARISON:  01/19/2023 FINDINGS: Lower chest: No acute abnormality. Hepatobiliary: No focal liver abnormality is seen. No gallstones, gallbladder wall thickening, or biliary dilatation. Pancreas: Unremarkable. No pancreatic ductal dilatation or surrounding inflammatory changes. Spleen: Normal in size without focal abnormality. Adrenals/Urinary Tract: Adrenal glands are unremarkable. Kidneys are normal, without renal calculi, focal lesion, or hydronephrosis. Bladder is unremarkable. Stomach/Bowel: Long segment thickening and inflammation of the entire sigmoid colon appears more  prominent compared to the prior study and again is most likely consistent with chronic diverticulitis. Underlying neoplasm cannot be entirely excluded by CT and ultimate correlation with colonoscopy is recommended. No associated focal abscess or free intraperitoneal air. No bowel obstruction. The appendix is unremarkable. Vascular/Lymphatic: Stable atherosclerosis of the abdominal aorta without aneurysm. Some small scattered mesenteric  lymph nodes are identified including in the central mesentery superior to the sigmoid colon. Largest measures approximately 8 mm in short axis on coronal images. Reproductive: Prostate is mild-to-moderately enlarged. Other: No abdominal wall hernia or abnormality. No abdominopelvic ascites. 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 IMPRESSION: 1. Long segment thickening and inflammation of the entire sigmoid colon appears more prominent compared to the prior study and again is most likely consistent with chronic diverticulitis. Underlying neoplasm cannot be entirely excluded by CT and eventual correlation with colonoscopy is recommended. No associated focal abscess or free intraperitoneal air. 2. Small scattered mesenteric lymph nodes including in the central mesentery superior to the sigmoid colon. Largest measures approximately 8 mm in short axis on coronal images. 3. Aortic atherosclerosis. 4. Mild-to-moderate prostate enlargement. Aortic Atherosclerosis (ICD10-I70.0). Electronically Signed   By: Irish Lack M.D.   On: 02/20/2023 17:06    Anti-infectives: Anti-infectives (From admission, onward)    Start     Dose/Rate Route Frequency Ordered Stop   02/21/23 0200  meropenem (MERREM) 1 g in sodium chloride 0.9 % 100 mL IVPB        1 g 200 mL/hr over 30 Minutes Intravenous Every 8 hours 02/20/23 1719 02/27/23 1759   02/20/23 1645  meropenem (MERREM) 1 g in sodium chloride 0.9 % 100 mL IVPB        1 g 200 mL/hr over 30 Minutes Intravenous NOW 02/20/23 1644 02/20/23 1815        Assessment/Plan Sigmoid diverticulitis  - Patient with ongoing diverticulitis that failed outpatient management with multiple rounds of oral antibiotics.  - CT with long segment thickening and inflammation of the entire sigmoid colon . No abscess or free air. To note, there are small scattered mesenteric lymph nodes. Will discuss with attending if any role to check CEA at this time.  -  No role for acute surgical intervention. Patient would like to avoid colostomy if possible. I think given he is non-toxic appearing, HDS (afebrile, tachycardia resolved, no hypotension), wbc wnl, no peritonitis on exam and he is symptomatically feeling better it would be reasonable to give trial of IV abx and start him on liquids to see how he responds. Discussed if he was to fail to improve with IV abx during admission we would recommend colectomy/colostomy here.     ID - meropenem VTE - SCDs, lovenox FEN - IVF, CLD Foley - none   Hx sigmoid diverticular stricture  Psoriatic arthritis on Remicade (last dose ~6 weeks ago) Hidradenitis suppurative Hx bladder cancer  I reviewed nursing notes, last 24 h vitals and pain scores, last 48 h intake and output, last 24 h labs and trends, and last 24 h imaging results.   LOS: 1 day    Jacinto Halim , [facility] 02/21/2023, 11:54 AM Please see Amion for pager number during day hours 7:00am-4:30pm

## 2023-02-21 NOTE — Plan of Care (Signed)

## 2023-02-22 LAB — CBC
HCT: 39.1 % (ref 39.0–52.0)
Hemoglobin: 12.9 g/dL — ABNORMAL LOW (ref 13.0–17.0)
MCH: 30.6 pg (ref 26.0–34.0)
MCHC: 33 g/dL (ref 30.0–36.0)
MCV: 92.7 fL (ref 80.0–100.0)
Platelets: 299 10*3/uL (ref 150–400)
RBC: 4.22 MIL/uL (ref 4.22–5.81)
RDW: 12 % (ref 11.5–15.5)
WBC: 6.9 10*3/uL (ref 4.0–10.5)
nRBC: 0 % (ref 0.0–0.2)

## 2023-02-22 LAB — GLUCOSE, CAPILLARY
Glucose-Capillary: 131 mg/dL — ABNORMAL HIGH (ref 70–99)
Glucose-Capillary: 181 mg/dL — ABNORMAL HIGH (ref 70–99)
Glucose-Capillary: 136 mg/dL — ABNORMAL HIGH (ref 70–99)
Glucose-Capillary: 150 mg/dL — ABNORMAL HIGH (ref 70–99)

## 2023-02-22 NOTE — Plan of Care (Signed)
  Problem: Health Behavior/Discharge Planning: Goal: Ability to identify and utilize available resources and services will improve Outcome: Progressing   Problem: Nutritional: Goal: Maintenance of adequate nutrition will improve Outcome: Progressing   Problem: Skin Integrity: Goal: Risk for impaired skin integrity will decrease Outcome: Progressing   Problem: Health Behavior/Discharge Planning: Goal: Ability to manage health-related needs will improve Outcome: Progressing   Problem: Clinical Measurements: Goal: Ability to maintain clinical measurements within normal limits will improve Outcome: Progressing   Problem: Activity: Goal: Risk for activity intolerance will decrease Outcome: Progressing

## 2023-02-22 NOTE — Progress Notes (Signed)
Subjective: CC: We reviewed his history.  Reports 3 weeks of ongoing llq abdominal pain that has not improved despite oral abx. His pain usually resolves in the am but returns after he has a BM and is worsened by PO intake. Today he again awoke without pain. He did have a small liquid bm this am and feels is LLQ pain is better than was it normally is after a BM. He has lost 30lbs since July. Has been taking Miralax and having multiple liquid bm's daily. 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 WBC 7 Objective: Vital signs in last 24 hours: Temp:  [98 F (36.7 C)-98.8 F (37.1 C)] 98 F (36.7 C) (09/19 0455) Pulse Rate:  [69-84] 77 (09/19 0455) Resp:  [16-18] 17 (09/19 0455) BP: (114-132)/(65-75) 131/75 (09/19 0455) SpO2:  [99 %-100 %] 99 % (09/19 0455) Last BM Date : 02/21/23  Intake/Output from previous day: 09/18 0701 - 09/19 0700 In: 1914.7 [I.V.:1614.7; IV Piggyback:300] Out: -  Intake/Output this shift: No intake/output data recorded.  PE: Gen:  Alert, NAD, pleasant Card:  rrr Pulm:  Rate and effort normal Abd: Soft, ND, mild LLQ ttp without rigidity or guarding Psych: A&Ox3   Lab Results:  Recent Labs    02/21/23 0359 02/22/23 0422  WBC 7.0 6.9  HGB 13.6 12.9*  HCT 41.3 39.1  PLT 310 299   BMET Recent Labs    02/20/23 1148 02/21/23 0359  NA 134* 136  K 3.7 3.9  CL 98 102  CO2 23 27  GLUCOSE 212* 120*  BUN 13 10  CREATININE 0.87 0.83  CALCIUM 9.1 8.7*   PT/INR No results for input(s): "LABPROT", "INR" in the last 72 hours. CMP     Component Value Date/Time   NA 136 02/21/2023 0359   K 3.9 02/21/2023 0359   CL 102 02/21/2023 0359   CO2 27 02/21/2023 0359   GLUCOSE 120 (H) 02/21/2023 0359   BUN 10 02/21/2023 0359   CREATININE 0.83 02/21/2023 0359   CALCIUM 8.7 (L) 02/21/2023 0359   PROT 8.4 (H) 02/20/2023 1148   ALBUMIN 3.4 (L) 02/20/2023 1148   AST 14  (L) 02/20/2023 1148   ALT 15 02/20/2023 1148   ALKPHOS 58 02/20/2023 1148   BILITOT 0.7 02/20/2023 1148   GFRNONAA >60 02/21/2023 0359   GFRAA >60 05/01/2019 2310   Lipase  No results found for: "LIPASE"  Studies/Results: CT ABDOMEN PELVIS W CONTRAST  Result Date: 02/20/2023 CLINICAL DATA:  Abdominal pain and history of diverticulitis. EXAM: CT ABDOMEN AND PELVIS WITH CONTRAST TECHNIQUE: Multidetector CT imaging of the abdomen and pelvis was performed using the standard protocol following bolus administration of intravenous contrast. RADIATION DOSE REDUCTION: This exam was performed according to the departmental dose-optimization program which includes automated exposure control, adjustment of the mA and/or kV according to patient size and/or use of iterative reconstruction technique. CONTRAST:  OMNIPAQUE IOHEXOL 300 MG/ML  SOLN COMPARISON:  01/19/2023 FINDINGS: Lower chest: No acute abnormality. Hepatobiliary: No focal liver abnormality is seen. No gallstones, gallbladder wall thickening, or biliary dilatation. Pancreas: Unremarkable. No pancreatic ductal dilatation or surrounding inflammatory changes. Spleen: Normal in size without focal abnormality. Adrenals/Urinary Tract: Adrenal glands are unremarkable. Kidneys are normal, without renal calculi, focal lesion, or hydronephrosis. Bladder is unremarkable. Stomach/Bowel: Long segment thickening and inflammation of the entire sigmoid colon appears more prominent compared to the prior study and  again is most likely consistent with chronic diverticulitis. Underlying neoplasm cannot be entirely excluded by CT and ultimate correlation with colonoscopy is recommended. No associated focal abscess or free intraperitoneal air. No bowel obstruction. The appendix is unremarkable. Vascular/Lymphatic: Stable atherosclerosis of the abdominal aorta without aneurysm. Some small scattered mesenteric lymph nodes are identified including in the central mesentery  superior to the sigmoid colon. Largest measures approximately 8 mm in short axis on coronal images. Reproductive: Prostate is mild-to-moderately enlarged. Other: No abdominal wall hernia or abnormality. No abdominopelvic ascites. 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 IMPRESSION: 1. Long segment thickening and inflammation of the entire sigmoid colon appears more prominent compared to the prior study and again is most likely consistent with chronic diverticulitis. Underlying neoplasm cannot be entirely excluded by CT and eventual correlation with colonoscopy is recommended. No associated focal abscess or free intraperitoneal air. 2. Small scattered mesenteric lymph nodes including in the central mesentery superior to the sigmoid colon. Largest measures approximately 8 mm in short axis on coronal images. 3. Aortic atherosclerosis. 4. Mild-to-moderate prostate enlargement. Aortic Atherosclerosis (ICD10-I70.0). Electronically Signed   By: Irish Lack M.D.   On: 02/20/2023 17:06    Anti-infectives: Anti-infectives (From admission, onward)    Start     Dose/Rate Route Frequency Ordered Stop   02/21/23 0200  meropenem (MERREM) 1 g in sodium chloride 0.9 % 100 mL IVPB        1 g 200 mL/hr over 30 Minutes Intravenous Every 8 hours 02/20/23 1719 02/27/23 1759   02/20/23 1645  meropenem (MERREM) 1 g in sodium chloride 0.9 % 100 mL IVPB        1 g 200 mL/hr over 30 Minutes Intravenous NOW 02/20/23 1644 02/20/23 1815        Assessment/Plan Sigmoid diverticulitis  - Patient with ongoing diverticulitis that failed outpatient management with multiple rounds of oral antibiotics.  - CT with long segment thickening and inflammation of the entire sigmoid colon . No abscess or free air. To note, there are small scattered mesenteric lymph nodes. Will discuss with attending if any role to check CEA at this time.  - Continue IV abx; will try full liquids. Miralax. See how  symptoms are tomorrow  - Discussed NPO after midnight tonight    ID - meropenem VTE - SCDs, lovenox FEN - IVF, CLD Foley - none   Hx sigmoid diverticular stricture  Psoriatic arthritis on Remicade (last dose ~6 weeks ago) Hidradenitis suppurative Hx bladder cancer  I reviewed nursing notes, last 24 h vitals and pain scores, last 48 h intake and output, last 24 h labs and trends, and last 24 h imaging results.   LOS: 2 days   I spent a total of 35 minutes in both face-to-face and non-face-to-face activities, excluding procedures performed, for this visit on the date of this encounter.    Marin Olp, MD [facility], [facility]

## 2023-02-22 NOTE — Consult Note (Signed)
WOC Nurse requested for preoperative stoma site marking  Discussed surgical procedure and stoma creation with patient. Explained role of the WOC nurse team.  Provided the patient with educational booklet and provided samples of pouching options.  Answered patient's questions.  Examined patient lying, sitting, and standing in order to place the marking in the patient's visual field, away from any creases or abdominal contour issues and within the rectus muscle.   Marked for colostomy in the LLQ  __3__ cm to the left of the umbilicus and __0__cm above/below the umbilicus.  Marked for ileostomy in the RLQ  _4___cm to the right of the umbilicus and  _0__ cm above/below the umbilicus.  Marked deep crease just above the umbilicus. When patient stands he has a definite shift of the abdominal topography due to recent weight loss >30 lbs since July.  Patient's abdomen cleansed with CHG wipes at site markings, allowed to air dry prior to marking.Covered mark with thin film transparent dressing to preserve mark until date of surgery.   WOC Nurse team will follow up with patient after surgery for continue ostomy care and teaching.  Adama Ivins [facility] MSN, RN,CWOCN, CNS, [employer] [phone]

## 2023-02-23 ENCOUNTER — Ambulatory Visit: Payer: Medicare Other | Admitting: Internal Medicine

## 2023-02-23 LAB — GLUCOSE, CAPILLARY
Glucose-Capillary: 120 mg/dL — ABNORMAL HIGH (ref 70–99)
Glucose-Capillary: 188 mg/dL — ABNORMAL HIGH (ref 70–99)
Glucose-Capillary: 157 mg/dL — ABNORMAL HIGH (ref 70–99)

## 2023-02-23 NOTE — Plan of Care (Signed)
Problem: Education: Goal: Ability to describe self-care measures that may prevent or decrease complications (Diabetes Survival Skills Education) will improve Outcome: Progressing   Problem: Coping: Goal: Ability to adjust to condition or change in health will improve Outcome: Progressing   Problem: Nutritional: Goal: Maintenance of adequate nutrition will improve Outcome: Progressing   Problem: Clinical Measurements: Goal: Ability to maintain clinical measurements within normal limits will improve Outcome: Progressing   Problem: Safety: Goal: Ability to remain free from injury will improve Outcome: Progressing   Haydee Salter, RN 02/23/23 1:55 PM

## 2023-02-23 NOTE — Care Management Important Message (Signed)
Important Message  Patient Details IM Letter given Name: Joshua [name] MRN: [id number] Date of Birth: August 18, 1955   Medicare Important Message Given:  Yes     [name] 02/23/2023, 2:55 PM

## 2023-02-23 NOTE — Progress Notes (Signed)
Subjective: CC: Patient reports worsening LLQ pain with liquids yesterday. Reports he is ready to have surgery. No n/v. Liquid bm yesterday.   Objective: Vital signs in last 24 hours: Temp:  [97.6 F (36.4 C)-98.7 F (37.1 C)] 98.5 F (36.9 C) (09/20 0915) Pulse Rate:  [67-72] 67 (09/20 0915) Resp:  [16-18] 18 (09/20 0915) BP: (122-140)/(72-87) 130/76 (09/20 0915) SpO2:  [97 %-100 %] 100 % (09/20 0915) Last BM Date : 02/21/23  Intake/Output from previous day: 09/19 0701 - 09/20 0700 In: 2577 [P.O.:600; I.V.:1577.1; IV Piggyback:399.9] Out: -  Intake/Output this shift: No intake/output data recorded.  PE: Gen:  Alert, NAD, pleasant Card:  reg Pulm:  Rate and effort normal Abd: Soft, ND, LLQ ttp that is worse from when I saw him the other day but still without rigidity or guarding Psych: A&Ox3   Lab Results:  Recent Labs    02/21/23 0359 02/22/23 0422  WBC 7.0 6.9  HGB 13.6 12.9*  HCT 41.3 39.1  PLT 310 299   BMET Recent Labs    02/20/23 1148 02/21/23 0359  NA 134* 136  K 3.7 3.9  CL 98 102  CO2 23 27  GLUCOSE 212* 120*  BUN 13 10  CREATININE 0.87 0.83  CALCIUM 9.1 8.7*   PT/INR No results for input(s): "LABPROT", "INR" in the last 72 hours. CMP     Component Value Date/Time   NA 136 02/21/2023 0359   K 3.9 02/21/2023 0359   CL 102 02/21/2023 0359   CO2 27 02/21/2023 0359   GLUCOSE 120 (H) 02/21/2023 0359   BUN 10 02/21/2023 0359   CREATININE 0.83 02/21/2023 0359   CALCIUM 8.7 (L) 02/21/2023 0359   PROT 8.4 (H) 02/20/2023 1148   ALBUMIN 3.4 (L) 02/20/2023 1148   AST 14 (L) 02/20/2023 1148   ALT 15 02/20/2023 1148   ALKPHOS 58 02/20/2023 1148   BILITOT 0.7 02/20/2023 1148   GFRNONAA >60 02/21/2023 0359   GFRAA >60 05/01/2019 2310   Lipase  No results found for: "LIPASE"  Studies/Results: No results found.  Anti-infectives: Anti-infectives (From admission, onward)    Start     Dose/Rate Route Frequency Ordered Stop   02/21/23  0200  meropenem (MERREM) 1 g in sodium chloride 0.9 % 100 mL IVPB        1 g 200 mL/hr over 30 Minutes Intravenous Every 8 hours 02/20/23 1719 02/27/23 1759   02/20/23 1645  meropenem (MERREM) 1 g in sodium chloride 0.9 % 100 mL IVPB        1 g 200 mL/hr over 30 Minutes Intravenous NOW 02/20/23 1644 02/20/23 1815        Assessment/Plan Sigmoid diverticulitis  - Patient with ongoing diverticulitis that failed outpatient management with multiple rounds of oral antibiotics.  - CT with long segment thickening and inflammation of the entire sigmoid colon. No abscess or free air. To note, there are small scattered mesenteric lymph nodes.  - Continue IV abx - Patient has failed non-operative management. Recommend Exploratory Laparotomy, Colectomy and Colostomy. He has been marked by WOCN. Unfortunately, we are unable to accomindate him on the OR schedule today given emergency's. Will discuss with attending if should post for Saturday vs Monday.  - Okay for CLD for now.    ID - meropenem VTE - SCDs, lovenox FEN - IVF, CLD Foley - none   Hx sigmoid diverticular stricture  Psoriatic arthritis on Remicade (last dose ~6 weeks ago) Hidradenitis suppurative  Hx bladder cancer  I reviewed nursing notes, last 24 h vitals and pain scores, last 48 h intake and output, last 24 h labs and trends, and last 24 h imaging results.   LOS: 3 days   I spent a total of 35 minutes in both face-to-face and non-face-to-face activities, excluding procedures performed, for this visit on the date of this encounter.    Marin Olp, MD [facility]

## 2023-02-23 NOTE — Progress Notes (Signed)
Pharmacy Antibiotic Note  Joshua [name] is a 67 y.o. male admitted on 02/20/2023 with sigmoid diverticulitis. Pharmacy has been consulted for Meropenem dosing.  Plan: Meropenem 1g IV q8h Monitor renal function, cultures, clinical course  Height: 5\' 8"  (172.7 cm) Weight: 74.4 kg (164 lb) IBW/kg (Calculated) : 68.4  Temp (24hrs), Avg:98.3 F (36.8 C), Min:97.6 F (36.4 C), Max:98.7 F (37.1 C)  Recent Labs  Lab 02/20/23 1148 02/21/23 0359 02/22/23 0422  WBC 9.0 7.0 6.9  CREATININE 0.87 0.83  --     Estimated Creatinine Clearance: 83.6 mL/min (by C-G formula based on SCr of 0.83 mg/dL).    Allergies  Allergen Reactions   Adalimumab Other (See Comments)   Penicillin G Other (See Comments)   Penicillins Hives    And fever blisters    Antimicrobials this admission: 9/17 Meropenem >>  Dose adjustments this admission: --  Microbiology results: None ordered   Dosage will likely remain stable at above dosage and a stop date has already been placed on the antibiotic. Need for further dosage adjustment appears unlikely at present.    Will sign off at this time.  Please reconsult if a change in clinical status warrants re-evaluation of dosage.     Adalberto Cole, PharmD, BCPS 02/23/2023 11:49 AM

## 2023-02-24 LAB — GLUCOSE, CAPILLARY
Glucose-Capillary: 113 mg/dL — ABNORMAL HIGH (ref 70–99)
Glucose-Capillary: 117 mg/dL — ABNORMAL HIGH (ref 70–99)
Glucose-Capillary: 136 mg/dL — ABNORMAL HIGH (ref 70–99)
Glucose-Capillary: 173 mg/dL — ABNORMAL HIGH (ref 70–99)
Glucose-Capillary: 87 mg/dL (ref 70–99)

## 2023-02-24 NOTE — Progress Notes (Signed)
   Subjective/Chief Complaint: Comfortable this morning Having BM's   Objective: Vital signs in last 24 hours: Temp:  [97.5 F (36.4 C)-98.6 F (37 C)] 97.5 F (36.4 C) (09/21 0506) Pulse Rate:  [73-85] 77 (09/21 0506) Resp:  [16-20] 16 (09/21 0506) BP: (128-132)/(72-80) 128/80 (09/21 0506) SpO2:  [98 %-100 %] 100 % (09/21 0506) Last BM Date : 02/23/23  Intake/Output from previous day: 09/20 0701 - 09/21 0700 In: 3167.6 [P.O.:1320; I.V.:1547.6; IV Piggyback:300] Out: -  Intake/Output this shift: No intake/output data recorded.  Exam: Awake and alert Abdomen soft, mild LLQ tenderness No frank peritonitis  Lab Results:  Recent Labs    02/22/23 0422  WBC 6.9  HGB 12.9*  HCT 39.1  PLT 299   BMET No results for input(s): "NA", "K", "CL", "CO2", "GLUCOSE", "BUN", "CREATININE", "CALCIUM" in the last 72 hours. PT/INR No results for input(s): "LABPROT", "INR" in the last 72 hours. ABG No results for input(s): "PHART", "HCO3" in the last 72 hours.  Invalid input(s): "PCO2", "PO2"  Studies/Results: No results found.  Anti-infectives: Anti-infectives (From admission, onward)    Start     Dose/Rate Route Frequency Ordered Stop   02/21/23 0200  meropenem (MERREM) 1 g in sodium chloride 0.9 % 100 mL IVPB        1 g 200 mL/hr over 30 Minutes Intravenous Every 8 hours 02/20/23 1719 02/27/23 1959   02/20/23 1645  meropenem (MERREM) 1 g in sodium chloride 0.9 % 100 mL IVPB        1 g 200 mL/hr over 30 Minutes Intravenous NOW 02/20/23 1644 02/20/23 1815       Assessment/Plan: Sigmoid diverticulitis  Continuing current care Limited OR availability this weekend so likely surgery on Monday unless patient acutely worsens. Will allow full liquids today   [name] MD 02/24/2023

## 2023-02-24 NOTE — Plan of Care (Signed)
Problem: Education: Goal: Ability to describe self-care measures that may prevent or decrease complications (Diabetes Survival Skills Education) will improve Outcome: Progressing   Problem: Coping: Goal: Ability to adjust to condition or change in health will improve Outcome: Progressing   Problem: Clinical Measurements: Goal: Ability to maintain clinical measurements within normal limits will improve Outcome: Progressing   Problem: Activity: Goal: Risk for activity intolerance will decrease Outcome: Progressing   Haydee Salter, RN 02/24/23 5:09 PM

## 2023-02-24 NOTE — Plan of Care (Signed)
  Problem: Coping: Goal: Ability to adjust to condition or change in health will improve Outcome: Completed/Met   Problem: Fluid Volume: Goal: Ability to maintain a balanced intake and output will improve Outcome: Completed/Met   Problem: Nutritional: Goal: Maintenance of adequate nutrition will improve Outcome: Progressing   Problem: Education: Goal: Knowledge of General Education information will improve Description: Including pain rating scale, medication(s)/side effects and non-pharmacologic comfort measures Outcome: Progressing   Problem: Health Behavior/Discharge Planning: Goal: Ability to manage health-related needs will improve Outcome: Progressing   Problem: Clinical Measurements: Goal: Ability to maintain clinical measurements within normal limits will improve Outcome: Progressing Goal: Will remain free from infection Outcome: Progressing Goal: Diagnostic test results will improve Outcome: Progressing Goal: Respiratory complications will improve Outcome: Progressing Goal: Cardiovascular complication will be avoided Outcome: Progressing   Problem: Activity: Goal: Risk for activity intolerance will decrease Outcome: Progressing   Problem: Nutrition: Goal: Adequate nutrition will be maintained Outcome: Progressing   Problem: Coping: Goal: Level of anxiety will decrease Outcome: Progressing   Problem: Elimination: Goal: Will not experience complications related to bowel motility Outcome: Progressing Goal: Will not experience complications related to urinary retention Outcome: Completed/Met   Problem: Pain Managment: Goal: General experience of comfort will improve Outcome: Progressing   Problem: Safety: Goal: Ability to remain free from injury will improve Outcome: Progressing   Problem: Skin Integrity: Goal: Risk for impaired skin integrity will decrease Outcome: Progressing

## 2023-02-25 LAB — GLUCOSE, CAPILLARY
Glucose-Capillary: 97 mg/dL (ref 70–99)
Glucose-Capillary: 228 mg/dL — ABNORMAL HIGH (ref 70–99)
Glucose-Capillary: 133 mg/dL — ABNORMAL HIGH (ref 70–99)
Glucose-Capillary: 131 mg/dL — ABNORMAL HIGH (ref 70–99)

## 2023-02-25 LAB — CBC
HCT: 40.2 % (ref 39.0–52.0)
Hemoglobin: 13.4 g/dL (ref 13.0–17.0)
MCH: 30.7 pg (ref 26.0–34.0)
MCHC: 33.3 g/dL (ref 30.0–36.0)
MCV: 92.2 fL (ref 80.0–100.0)
Platelets: 294 10*3/uL (ref 150–400)
RBC: 4.36 MIL/uL (ref 4.22–5.81)
RDW: 11.9 % (ref 11.5–15.5)
WBC: 7.7 10*3/uL (ref 4.0–10.5)
nRBC: 0 % (ref 0.0–0.2)

## 2023-02-25 LAB — SURGICAL PCR SCREEN
MRSA, PCR: NEGATIVE
Staphylococcus aureus: NEGATIVE

## 2023-02-25 MED ORDER — ALVIMOPAN 12 MG PO CAPS
12.0000 mg | ORAL_CAPSULE | ORAL | Status: AC
  Administered 2023-02-26: 12 mg via ORAL
  Filled 2023-02-25 (×2): qty 1

## 2023-02-25 NOTE — Plan of Care (Signed)
  Problem: Education: Goal: Individualized Educational Video(s) Outcome: Completed/Met   Problem: Nutritional: Goal: Maintenance of adequate nutrition will improve Outcome: Progressing   Problem: Education: Goal: Knowledge of General Education information will improve Description: Including pain rating scale, medication(s)/side effects and non-pharmacologic comfort measures Outcome: Progressing   Problem: Health Behavior/Discharge Planning: Goal: Ability to manage health-related needs will improve Outcome: Progressing   Problem: Clinical Measurements: Goal: Ability to maintain clinical measurements within normal limits will improve Outcome: Progressing Goal: Will remain free from infection Outcome: Progressing Goal: Diagnostic test results will improve Outcome: Progressing Goal: Respiratory complications will improve Outcome: Progressing Goal: Cardiovascular complication will be avoided Outcome: Progressing   Problem: Activity: Goal: Risk for activity intolerance will decrease Outcome: Progressing   Problem: Nutrition: Goal: Adequate nutrition will be maintained Outcome: Progressing   Problem: Coping: Goal: Level of anxiety will decrease Outcome: Progressing   Problem: Elimination: Goal: Will not experience complications related to bowel motility Outcome: Progressing   Problem: Pain Managment: Goal: General experience of comfort will improve Outcome: Progressing   Problem: Safety: Goal: Ability to remain free from injury will improve Outcome: Progressing   Problem: Skin Integrity: Goal: Risk for impaired skin integrity will decrease Outcome: Progressing

## 2023-02-25 NOTE — Progress Notes (Signed)
   Subjective/Chief Complaint: Comfortable this morning   Objective: Vital signs in last 24 hours: Temp:  [98.3 F (36.8 C)-98.5 F (36.9 C)] 98.3 F (36.8 C) (09/22 0501) Pulse Rate:  [69-86] 69 (09/22 0501) Resp:  [14-18] 18 (09/22 0501) BP: (126-133)/(70-81) 128/73 (09/22 0501) SpO2:  [100 %] 100 % (09/22 0501) Last BM Date : 02/24/23  Intake/Output from previous day: 09/21 0701 - 09/22 0700 In: 2387 [P.O.:600; I.V.:1487.1; IV Piggyback:299.9] Out: -  Intake/Output this shift: No intake/output data recorded.  Exam: Awake and alert Only mild LLQ tenderness and guarding  Lab Results:  Recent Labs    02/25/23 0339  WBC 7.7  HGB 13.4  HCT 40.2  PLT 294   BMET No results for input(s): "NA", "K", "CL", "CO2", "GLUCOSE", "BUN", "CREATININE", "CALCIUM" in the last 72 hours. PT/INR No results for input(s): "LABPROT", "INR" in the last 72 hours. ABG No results for input(s): "PHART", "HCO3" in the last 72 hours.  Invalid input(s): "PCO2", "PO2"  Studies/Results: No results found.  Anti-infectives: Anti-infectives (From admission, onward)    Start     Dose/Rate Route Frequency Ordered Stop   02/21/23 0200  meropenem (MERREM) 1 g in sodium chloride 0.9 % 100 mL IVPB        1 g 200 mL/hr over 30 Minutes Intravenous Every 8 hours 02/20/23 1719 02/27/23 1959   02/20/23 1645  meropenem (MERREM) 1 g in sodium chloride 0.9 % 100 mL IVPB        1 g 200 mL/hr over 30 Minutes Intravenous NOW 02/20/23 1644 02/20/23 1815       Assessment/Plan: Sigmoid diverticulitis  He has failed conservative management and has not improved despite IV antibiotics.  A partial colectomy and colostomy is recommended and he fully understands and aware of this and is ready for surgery.  I again discussed the surgical procedure in detail.  We discussed the risk which includes but is not limited to bleeding, infection, the need for drain placement, injury to surrounding structures, the need  to leave the skin open with wound packing, cardiopulmonary issues with anesthesia and surgery, postoperative recovery, etc.  He understands and agrees to proceed with surgery which is scheduled for tomorrow   [name] 02/25/2023

## 2023-02-26 ENCOUNTER — Encounter (HOSPITAL_COMMUNITY): Admission: EM | Disposition: A | Discharge status: Home Health | Payer: Self-pay | Source: Ambulatory Visit

## 2023-02-26 ENCOUNTER — Encounter (HOSPITAL_COMMUNITY): Payer: Self-pay

## 2023-02-26 ENCOUNTER — Inpatient Hospital Stay (HOSPITAL_COMMUNITY): Payer: Medicare Other | Admitting: Anesthesiology

## 2023-02-26 ENCOUNTER — Other Ambulatory Visit: Payer: Self-pay

## 2023-02-26 DIAGNOSIS — K5730 Diverticulosis of large intestine without perforation or abscess without bleeding: Secondary | ICD-10-CM

## 2023-02-26 DIAGNOSIS — E43 Unspecified severe protein-calorie malnutrition: Secondary | ICD-10-CM | POA: Insufficient documentation

## 2023-02-26 HISTORY — PX: PARTIAL COLECTOMY: SHX5273

## 2023-02-26 LAB — GLUCOSE, CAPILLARY
Glucose-Capillary: 110 mg/dL — ABNORMAL HIGH (ref 70–99)
Glucose-Capillary: 153 mg/dL — ABNORMAL HIGH (ref 70–99)
Glucose-Capillary: 166 mg/dL — ABNORMAL HIGH (ref 70–99)
Glucose-Capillary: 195 mg/dL — ABNORMAL HIGH (ref 70–99)
Glucose-Capillary: 235 mg/dL — ABNORMAL HIGH (ref 70–99)
Glucose-Capillary: 194 mg/dL — ABNORMAL HIGH (ref 70–99)

## 2023-02-26 SURGERY — COLECTOMY, PARTIAL
Anesthesia: General

## 2023-02-26 MED ORDER — INSULIN ASPART 100 UNIT/ML IJ SOLN
0.0000 [IU] | INTRAMUSCULAR | Status: AC | PRN
  Administered 2023-02-26 (×2): 2 [IU] via SUBCUTANEOUS
  Filled 2023-02-26: qty 1

## 2023-02-26 MED ORDER — BUPIVACAINE HCL (PF) 0.5 % IJ SOLN
INTRAMUSCULAR | Status: AC
  Filled 2023-02-26: qty 30

## 2023-02-26 MED ORDER — MIDAZOLAM HCL 2 MG/2ML IJ SOLN
INTRAMUSCULAR | Status: AC
  Filled 2023-02-26: qty 2

## 2023-02-26 MED ORDER — HYDROMORPHONE HCL 2 MG/ML IJ SOLN
INTRAMUSCULAR | Status: AC
  Filled 2023-02-26: qty 1

## 2023-02-26 MED ORDER — DEXAMETHASONE SODIUM PHOSPHATE 10 MG/ML IJ SOLN
INTRAMUSCULAR | Status: AC
  Filled 2023-02-26: qty 1

## 2023-02-26 MED ORDER — CHLORHEXIDINE GLUCONATE 0.12 % MT SOLN: 15.0000 mL | Freq: Once | OROMUCOSAL | Status: DC

## 2023-02-26 MED ORDER — HYDROMORPHONE HCL 1 MG/ML IJ SOLN
1.0000 mg | INTRAMUSCULAR | Status: DC | PRN
  Administered 2023-02-26 – 2023-02-27 (×5): 1 mg via INTRAVENOUS
  Filled 2023-02-26 (×5): qty 1

## 2023-02-26 MED ORDER — HYDROMORPHONE HCL 1 MG/ML IJ SOLN
INTRAMUSCULAR | Status: AC
  Administered 2023-02-26: 0.5 mg via INTRAVENOUS
  Filled 2023-02-26: qty 1

## 2023-02-26 MED ORDER — OXYCODONE HCL 5 MG PO TABS: 5.0000 mg | ORAL_TABLET | Freq: Once | ORAL | Status: DC | PRN

## 2023-02-26 MED ORDER — KETAMINE HCL 50 MG/5ML IJ SOSY
PREFILLED_SYRINGE | INTRAMUSCULAR | Status: AC
  Filled 2023-02-26: qty 5

## 2023-02-26 MED ORDER — OXYCODONE HCL 5 MG/5ML PO SOLN: 5.0000 mg | Freq: Once | ORAL | Status: DC | PRN

## 2023-02-26 MED ORDER — HYDROMORPHONE HCL 1 MG/ML IJ SOLN
INTRAMUSCULAR | Status: AC
  Administered 2023-02-26: 0.25 mg via INTRAVENOUS
  Filled 2023-02-26: qty 1

## 2023-02-26 MED ORDER — DEXAMETHASONE SODIUM PHOSPHATE 10 MG/ML IJ SOLN
INTRAMUSCULAR | Status: DC | PRN
Start: 2023-02-26 — End: 2023-02-26
  Administered 2023-02-26: 4 mg via INTRAVENOUS

## 2023-02-26 MED ORDER — PROPOFOL 10 MG/ML IV BOLUS
INTRAVENOUS | Status: AC
  Filled 2023-02-26: qty 20

## 2023-02-26 MED ORDER — ONDANSETRON HCL 4 MG/2ML IJ SOLN: 4.0000 mg | Freq: Once | INTRAMUSCULAR | Status: DC | PRN

## 2023-02-26 MED ORDER — LACTATED RINGERS IV SOLN: INTRAVENOUS | Status: DC | PRN

## 2023-02-26 MED ORDER — OXYCODONE HCL 5 MG PO TABS
5.0000 mg | ORAL_TABLET | ORAL | Status: DC | PRN
  Administered 2023-02-26 – 2023-02-27 (×2): 10 mg via ORAL
  Filled 2023-02-26 (×2): qty 2

## 2023-02-26 MED ORDER — LIDOCAINE HCL (PF) 2 % IJ SOLN
INTRAMUSCULAR | Status: AC
  Filled 2023-02-26: qty 5

## 2023-02-26 MED ORDER — ONDANSETRON HCL 4 MG/2ML IJ SOLN
INTRAMUSCULAR | Status: AC
  Filled 2023-02-26: qty 2

## 2023-02-26 MED ORDER — SUGAMMADEX SODIUM 200 MG/2ML IV SOLN
INTRAVENOUS | Status: DC | PRN
Start: 2023-02-26 — End: 2023-02-26
  Administered 2023-02-26: 150 mg via INTRAVENOUS

## 2023-02-26 MED ORDER — ACETAMINOPHEN 500 MG PO TABS: 1000.0000 mg | ORAL_TABLET | Freq: Once | ORAL | Status: DC

## 2023-02-26 MED ORDER — LIDOCAINE HCL (CARDIAC) PF 100 MG/5ML IV SOSY
PREFILLED_SYRINGE | INTRAVENOUS | Status: DC | PRN
  Administered 2023-02-26: 60 mg via INTRAVENOUS

## 2023-02-26 MED ORDER — BUPIVACAINE HCL (PF) 0.25 % IJ SOLN
INTRAMUSCULAR | Status: DC | PRN
Start: 2023-02-26 — End: 2023-02-26
  Administered 2023-02-26: 30 mL

## 2023-02-26 MED ORDER — ACETAMINOPHEN 10 MG/ML IV SOLN
INTRAVENOUS | Status: DC | PRN
Start: 2023-02-26 — End: 2023-02-26
  Administered 2023-02-26: 1000 mg via INTRAVENOUS

## 2023-02-26 MED ORDER — ACETAMINOPHEN 500 MG PO TABS
1000.0000 mg | ORAL_TABLET | Freq: Four times a day (QID) | ORAL | Status: DC
  Administered 2023-02-26 – 2023-02-28 (×6): 1000 mg via ORAL
  Filled 2023-02-26 (×12): qty 2

## 2023-02-26 MED ORDER — HYDROMORPHONE HCL 1 MG/ML IJ SOLN
INTRAMUSCULAR | Status: DC | PRN
Start: 2023-02-26 — End: 2023-02-26
  Administered 2023-02-26: 1 mg via INTRAVENOUS

## 2023-02-26 MED ORDER — HYDROMORPHONE HCL 1 MG/ML IJ SOLN: 0.2500 mg | INTRAMUSCULAR | Status: DC | PRN

## 2023-02-26 MED ORDER — PHENYLEPHRINE 80 MCG/ML (10ML) SYRINGE FOR IV PUSH (FOR BLOOD PRESSURE SUPPORT)
PREFILLED_SYRINGE | INTRAVENOUS | Status: DC | PRN
Start: 2023-02-26 — End: 2023-02-26
  Administered 2023-02-26: 80 ug via INTRAVENOUS
  Administered 2023-02-26: 160 ug via INTRAVENOUS

## 2023-02-26 MED ORDER — HYDROMORPHONE HCL 1 MG/ML IJ SOLN
0.2500 mg | INTRAMUSCULAR | Status: DC | PRN
  Administered 2023-02-26: 0.5 mg via INTRAVENOUS
  Administered 2023-02-26: 0.25 mg via INTRAVENOUS
  Administered 2023-02-26 (×2): 0.5 mg via INTRAVENOUS

## 2023-02-26 MED ORDER — ROCURONIUM BROMIDE 100 MG/10ML IV SOLN
INTRAVENOUS | Status: DC | PRN
  Administered 2023-02-26: 70 mg via INTRAVENOUS

## 2023-02-26 MED ORDER — FENTANYL CITRATE (PF) 100 MCG/2ML IJ SOLN
INTRAMUSCULAR | Status: DC | PRN
  Administered 2023-02-26: 100 ug via INTRAVENOUS

## 2023-02-26 MED ORDER — AMISULPRIDE (ANTIEMETIC) 5 MG/2ML IV SOLN
INTRAVENOUS | Status: AC
  Filled 2023-02-26: qty 4

## 2023-02-26 MED ORDER — ROCURONIUM BROMIDE 10 MG/ML (PF) SYRINGE
PREFILLED_SYRINGE | INTRAVENOUS | Status: AC
  Filled 2023-02-26: qty 10

## 2023-02-26 MED ORDER — MIDAZOLAM HCL 5 MG/5ML IJ SOLN
INTRAMUSCULAR | Status: DC | PRN
  Administered 2023-02-26: 2 mg via INTRAVENOUS

## 2023-02-26 MED ORDER — PROPOFOL 10 MG/ML IV BOLUS
INTRAVENOUS | Status: DC | PRN
  Administered 2023-02-26: 150 mg via INTRAVENOUS

## 2023-02-26 MED ORDER — 0.9 % SODIUM CHLORIDE (POUR BTL) OPTIME
TOPICAL | Status: DC | PRN
  Administered 2023-02-26: 2000 mL
  Administered 2023-02-26 (×2): 1000 mL

## 2023-02-26 MED ORDER — ONDANSETRON HCL 4 MG/2ML IJ SOLN
INTRAMUSCULAR | Status: DC | PRN
  Administered 2023-02-26: 4 mg via INTRAVENOUS

## 2023-02-26 MED ORDER — AMISULPRIDE (ANTIEMETIC) 5 MG/2ML IV SOLN
10.0000 mg | Freq: Once | INTRAVENOUS | Status: AC | PRN
  Administered 2023-02-26: 10 mg via INTRAVENOUS

## 2023-02-26 MED ORDER — ACETAMINOPHEN 10 MG/ML IV SOLN
INTRAVENOUS | Status: AC
  Filled 2023-02-26: qty 100

## 2023-02-26 MED ORDER — KETAMINE HCL 10 MG/ML IJ SOLN
INTRAMUSCULAR | Status: DC | PRN
Start: 2023-02-26 — End: 2023-02-26
  Administered 2023-02-26: 20 mg via INTRAVENOUS

## 2023-02-26 MED ORDER — LACTATED RINGERS IV SOLN: INTRAVENOUS | Status: DC

## 2023-02-26 MED ORDER — ALVIMOPAN 12 MG PO CAPS
12.0000 mg | ORAL_CAPSULE | Freq: Two times a day (BID) | ORAL | Status: DC
  Administered 2023-02-27 – 2023-03-01 (×5): 12 mg via ORAL
  Filled 2023-02-26 (×6): qty 1

## 2023-02-26 MED ORDER — FENTANYL CITRATE (PF) 100 MCG/2ML IJ SOLN
INTRAMUSCULAR | Status: AC
  Filled 2023-02-26: qty 2

## 2023-02-26 SURGICAL SUPPLY — 57 items
APL SWBSTK 6 STRL LF DISP (MISCELLANEOUS) ×2
APPLICATOR COTTON TIP 6 STRL (MISCELLANEOUS) ×2 IMPLANT
APPLICATOR COTTON TIP 6IN STRL (MISCELLANEOUS) ×2 IMPLANT
BAG COUNTER SPONGE SURGICOUNT (BAG) IMPLANT
BAG SPNG CNTER NS LX DISP (BAG)
BLADE EXTENDED COATED 6.5IN (ELECTRODE) IMPLANT
BLADE HEX COATED 2.75 (ELECTRODE) ×1 IMPLANT
BLADE SURG SZ10 CARB STEEL (BLADE) IMPLANT
CLIP TI LARGE 6 (CLIP) IMPLANT
COVER MAYO STAND STRL (DRAPES) ×1 IMPLANT
COVER SURGICAL LIGHT HANDLE (MISCELLANEOUS) ×1 IMPLANT
DRAPE LAPAROSCOPIC ABDOMINAL (DRAPES) ×1 IMPLANT
DRAPE SHEET LG 3/4 BI-LAMINATE (DRAPES) IMPLANT
DRAPE WARM FLUID 44X44 (DRAPES) ×1 IMPLANT
DRSG OPSITE POSTOP 4X8 (GAUZE/BANDAGES/DRESSINGS) IMPLANT
ELECT BLADE TIP CTD 4 INCH (ELECTRODE) IMPLANT
ELECT REM PT RETURN 15FT ADLT (MISCELLANEOUS) ×1 IMPLANT
GAUZE PAD ABD 8X10 STRL (GAUZE/BANDAGES/DRESSINGS) IMPLANT
GAUZE SPONGE 4X4 12PLY STRL (GAUZE/BANDAGES/DRESSINGS) ×1 IMPLANT
GLOVE BIO SURGEON STRL SZ7.5 (GLOVE) ×1 IMPLANT
GLOVE BIOGEL PI IND STRL 7.0 (GLOVE) ×1 IMPLANT
GOWN STRL REUS W/ TWL XL LVL3 (GOWN DISPOSABLE) ×2 IMPLANT
GOWN STRL REUS W/TWL XL LVL3 (GOWN DISPOSABLE) ×2
HANDLE SUCTION POOLE (INSTRUMENTS) ×1 IMPLANT
KIT BASIN OR (CUSTOM PROCEDURE TRAY) ×1 IMPLANT
KIT TURNOVER KIT A (KITS) IMPLANT
LEGGING LITHOTOMY PAIR STRL (DRAPES) IMPLANT
LIGASURE IMPACT 36 18CM CVD LR (INSTRUMENTS) IMPLANT
NS IRRIG 1000ML POUR BTL (IV SOLUTION) ×2 IMPLANT
PACK GENERAL/GYN (CUSTOM PROCEDURE TRAY) ×1 IMPLANT
RELOAD PROXIMATE 75MM BLUE (ENDOMECHANICALS) ×1 IMPLANT
RELOAD STAPLE 75 3.8 BLU REG (ENDOMECHANICALS) IMPLANT
SHEARS HARMONIC 36 ACE (MISCELLANEOUS) IMPLANT
SPONGE T-LAP 18X18 ~~LOC~~+RFID (SPONGE) IMPLANT
STAPLER CVD CUT BL 40 RELOAD (ENDOMECHANICALS) ×1 IMPLANT
STAPLER CVD CUT BLU 40 RELOAD (ENDOMECHANICALS) IMPLANT
STAPLER PROXIMATE 75MM BLUE (STAPLE) IMPLANT
STAPLER VISISTAT 35W (STAPLE) ×1 IMPLANT
SUCTION POOLE HANDLE (INSTRUMENTS) ×1
SUT NOV 1 T60/GS (SUTURE) IMPLANT
SUT NOVA NAB DX-16 0-1 5-0 T12 (SUTURE) IMPLANT
SUT NOVA T20/GS 25 (SUTURE) IMPLANT
SUT PDS AB 1 TP1 96 (SUTURE) ×2 IMPLANT
SUT PROLENE 2 0 SH DA (SUTURE) IMPLANT
SUT SILK 2 0 (SUTURE) ×1
SUT SILK 2 0 SH CR/8 (SUTURE) ×1 IMPLANT
SUT SILK 2 0SH CR/8 30 (SUTURE) IMPLANT
SUT SILK 2-0 18XBRD TIE 12 (SUTURE) ×1 IMPLANT
SUT SILK 2-0 30XBRD TIE 12 (SUTURE) IMPLANT
SUT SILK 3 0 (SUTURE) ×2
SUT SILK 3 0 SH CR/8 (SUTURE) ×1 IMPLANT
SUT SILK 3-0 18XBRD TIE 12 (SUTURE) ×2 IMPLANT
TOWEL OR 17X26 10 PK STRL BLUE (TOWEL DISPOSABLE) ×2 IMPLANT
TRAY FOLEY MTR SLVR 16FR STAT (SET/KITS/TRAYS/PACK) ×1 IMPLANT
WATER STERILE IRR 1000ML POUR (IV SOLUTION) IMPLANT
YANKAUER SUCT BULB TIP 10FT TU (MISCELLANEOUS) IMPLANT
YANKAUER SUCT BULB TIP NO VENT (SUCTIONS) ×1 IMPLANT

## 2023-02-26 NOTE — Anesthesia Preprocedure Evaluation (Addendum)
Anesthesia Evaluation  Patient identified by MRN, date of birth, ID band Patient awake    Reviewed: Allergy & Precautions, NPO status , Patient's Chart, lab work & pertinent test results  Airway Mallampati: III  TM Distance: >3 FB Neck ROM: Full    Dental  (+) Teeth Intact, Dental Advisory Given   Pulmonary Current Smoker and Patient abstained from smoking. Cigars occasionally   Pulmonary exam normal breath sounds clear to auscultation       Cardiovascular hypertension (140/80 preop, no home meds), Normal cardiovascular exam Rhythm:Regular Rate:Normal     Neuro/Psych negative neurological ROS  negative psych ROS   GI/Hepatic Neg liver ROS,,,Diverticulitis    Endo/Other  diabetes, Well Controlled, Type 2, Oral Hypoglycemic Agents    Renal/GU negative Renal ROS  negative genitourinary   Musculoskeletal  (+) Arthritis , Osteoarthritis,    Abdominal   Peds  Hematology negative hematology ROS (+) Hb 12.9, plt 294   Anesthesia Other Findings   Reproductive/Obstetrics negative OB ROS                             Anesthesia Physical Anesthesia Plan  ASA: 2  Anesthesia Plan: General   Post-op Pain Management: Tylenol PO (pre-op)*   Induction: Intravenous  PONV Risk Score and Plan: Ondansetron, Dexamethasone, Midazolam and Treatment may vary due to age or medical condition  Airway Management Planned: Oral ETT and Video Laryngoscope Planned  Additional Equipment: None  Intra-op Plan:   Post-operative Plan: Extubation in OR  Informed Consent: I have reviewed the patients History and Physical, chart, labs and discussed the procedure including the risks, benefits and alternatives for the proposed anesthesia with the patient or authorized representative who has indicated his/her understanding and acceptance.     Dental advisory given  Plan Discussed with: CRNA  Anesthesia Plan  Comments: (Last airway note: Laryngoscope Size: Mac and 3 Grade View: Grade III Tube type: Oral Tube size: 7.5 mm Number of attempts: 2   Will go directly to glidescope)       Anesthesia Quick Evaluation

## 2023-02-26 NOTE — Anesthesia Postprocedure Evaluation (Signed)
Anesthesia Post Note  Patient: Joshua [name]  Procedure(s) Performed: EXPLORATORY LAPAROTOMY; PARTIAL COLECTOMY; COLOSTOMY     Patient location during evaluation: PACU Anesthesia Type: General Level of consciousness: awake and alert, oriented and patient cooperative Pain management: pain level controlled Vital Signs Assessment: post-procedure vital signs reviewed and stable Respiratory status: spontaneous breathing, nonlabored ventilation and respiratory function stable Cardiovascular status: blood pressure returned to baseline and stable Postop Assessment: no apparent nausea or vomiting Anesthetic complications: no   No notable events documented.  Last Vitals:  Vitals:   02/26/23 1430 02/26/23 1459  BP: (!) 142/81 139/83  Pulse: (!) 111 (!) 114  Resp: (!) 24 18  Temp:  36.7 C  SpO2: 100% 100%    Last Pain:  Vitals:   02/26/23 1459  TempSrc: Oral  PainSc:                  Lannie Fields

## 2023-02-26 NOTE — Plan of Care (Signed)
  Problem: Education: Goal: Ability to describe self-care measures that may prevent or decrease complications (Diabetes Survival Skills Education) will improve Outcome: Progressing   Problem: Education: Goal: Knowledge of General Education information will improve Description: Including pain rating scale, medication(s)/side effects and non-pharmacologic comfort measures Outcome: Progressing   Problem: Activity: Goal: Risk for activity intolerance will decrease Outcome: Progressing

## 2023-02-26 NOTE — Transfer of Care (Signed)
Immediate Anesthesia Transfer of Care Note  Patient: Joshua [name]  Procedure(s) Performed: EXPLORATORY LAPAROTOMY; PARTIAL COLECTOMY; COLOSTOMY  Patient Location: PACU  Anesthesia Type:General  Level of Consciousness: awake, alert , and oriented  Airway & Oxygen Therapy: Patient Spontanous Breathing and Patient connected to face mask oxygen  Post-op Assessment: Report given to RN and Post -op Vital signs reviewed and stable  Post vital signs: Reviewed and stable  Last Vitals:  Vitals Value Taken Time  BP 169/87   Temp    Pulse 87   Resp 12   SpO2 100     Last Pain:  Vitals:   02/26/23 0932  TempSrc:   PainSc: 7       Patients Stated Pain Goal: 0 (02/26/23 0932)  Complications: No notable events documented.

## 2023-02-26 NOTE — Op Note (Signed)
Joshua [name] 02/20/2023 - 02/26/2023   Pre-op Diagnosis: SIGMOID DIVERTICULITIS     Post-op Diagnosis: SAME  Procedure(s): EXPLORATORY LAPAROTOMY; PARTIAL COLECTOMY; COLOSTOMY (HARTMAN'S PROCEDURE)  Surgeon(s): [name], MD Moise Boring, MD  Anesthesia: General  Staff:  Scrub Person: Angelena Form; [place] Ouch, [facility] W  Estimated Blood Loss: Minimal               Specimens: SENT TO PATH  Indications: This is a 67 year old gentleman with severe sigmoid diverticulitis which is failed to improve with conservative management.  He now presents for a partial colectomy and colostomy.  Findings: The patient was found to have a significant area of sigmoid diverticulitis with a phlegmon and small bowel adhered to the area.  The area of diverticulitis was at least 10 cm in length.  Procedure: The patient was brought to the operating identifies correct patient.  He was placed upon the operating table and general anesthesia was induced.  His abdomen was prepped and draped in the usual sterile fashion.  We created a lower midline incision with a scalpel.  We then took this down through subcutaneous tissue the fascia which was then opened the entire length of the incision.  We then opened the peritoneum the entire length of the incision.  We evaluated the sigmoid colon he was found to have a large loop involving diverticulitis with small bowel stuck to the diverticulitis anterior medially.  Colon proximal medial to this area was normal.  The small bowel did not appear obstructed.  At this point significant dissection was carried out to free up the hard phlegmon from the pelvic sidewall.  We stayed right on the side of the colon as we use both the cautery and LigaSure as well as finger fracturing to free up the colon.  We transected the colon proximal to the area of diverticulitis with a GIA 75 stapler.  We then mobilized further into the rectum below the peritoneal reflection and  we were then able to get around the rectum and transected in this area with the contour stapler.  We continued to take down the colon mesentery superiorly at the sigmoid colon using the LigaSure as well as 2-0 silk sutures.  Again there was a fairly intense inflammatory process.  We finally freed up the entire colon which still had the small bowel stuck to it.  Using both dissection with the Metzenbaum scissors and blunt dissection we were then able to free the small bowel in the small bowel mesentery off of the side of the diverticulitis.  We evaluated the small bowel and saw no evidence of bowel injury.  The mesentery was inflamed but appeared to be hemostatic as well.  We then sent the sigmoid colon to pathology and grossly it appeared consistent with diverticulitis per the pathology report.  We then mobilized the descending colon further along the white line of Toldt.  We made a separate skin incision the patient's left upper quadrant which had been previously marked by the ostomy nurses.  We took this down in a circular fashion to the fascia which was then opened in a cruciate fashion.  We then separated the underlying muscle fibers.  We controlled bleeding along the muscle fibers with electrocautery.  We then opened the peritoneum.  We pulled out the descending colon this as the ostomy.  We had to mobilize the white line slightly further to relieve tension on the ostomy site.  We also opened up the peritoneum further as well.  At  this point we then copes irrigated the abdomen normal saline.  Hemostasis appeared to be achieved.  We placed 2 separate Prolene sutures at the staple line of the rectal stump for marking purposes.  We again irrigated the abdomen saline further and hemostasis peer to be achieved.  We then closed the patient's midline fascia with a running #1 looped PDS suture.  We then irrigated the skin and anesthetized the fascia with Marcaine and then closed skin with skin staples.  We then took  down the staple line of the ostomy and returned it circumferentially with interrupted 3-0 Vicryl sutures.  The ostomy appeared pink and most edges except 1 where there is bruising and some duskiness.  I did not appear to be under tension.  An ostomy appliance was applied and a honeycomb dressing to the midline wound.  The patient tolerated the procedure well.  All the counts were correct at the end of the procedure.  The patient was then extubated in the operating room and taken in stable condition to the recovery room.          [name] MD  Date: 02/26/2023  Time: 12:17 PM

## 2023-02-26 NOTE — Progress Notes (Signed)
Patient ID: Joshua [name], male   DOB: 09-26-1955, 67 y.o.   MRN: [id number]    Pre Procedure note for inpatients:   Joshua [name] has been scheduled for Procedure(s): PARTIAL COLECTOMY; COLOSTOMY (N/A) today. The various methods of treatment have been discussed with the patient. After consideration of the risks, benefits and treatment options the patient has consented to the planned procedure.   The patient has been seen and labs reviewed. There are no changes in the patient's condition to prevent proceeding with the planned procedure today.  Recent labs:  Lab Results  Component Value Date   WBC 7.7 02/25/2023   HGB 13.4 02/25/2023   HCT 40.2 02/25/2023   PLT 294 02/25/2023   GLUCOSE 120 (H) 02/21/2023   CHOL 188 04/12/2021   TRIG 136.0 04/12/2021   HDL 42.10 04/12/2021   LDLDIRECT 131.0 09/02/2018   LDLCALC 119 (H) 04/12/2021   ALT 15 02/20/2023   AST 14 (L) 02/20/2023   NA 136 02/21/2023   K 3.9 02/21/2023   CL 102 02/21/2023   CREATININE 0.83 02/21/2023   BUN 10 02/21/2023   CO2 27 02/21/2023   TSH 1.70 04/12/2021   PSA 1.29 01/31/2016   HGBA1C 7.6 (A) 12/11/2022   MICROALBUR <0.7 03/12/2019    [name], MD 02/26/2023 7:43 AM

## 2023-02-26 NOTE — Anesthesia Procedure Notes (Signed)
Procedure Name: Intubation Date/Time: 02/26/2023 10:12 AM  Performed by: Maurene Capes, CRNAPre-anesthesia Checklist: Patient identified, Emergency Drugs available, Suction available and Patient being monitored Patient Re-evaluated:Patient Re-evaluated prior to induction Oxygen Delivery Method: Circle System Utilized Preoxygenation: Pre-oxygenation with 100% oxygen Induction Type: IV induction Ventilation: Mask ventilation without difficulty Laryngoscope Size: Glidescope and 4 Tube type: Oral Tube size: 7.5 mm Number of attempts: 1 Airway Equipment and Method: Oral airway and Rigid stylet Placement Confirmation: ETT inserted through vocal cords under direct vision, positive ETCO2 and breath sounds checked- equal and bilateral Secured at: 23 cm Tube secured with: Tape Dental Injury: Teeth and Oropharynx as per pre-operative assessment

## 2023-02-27 ENCOUNTER — Encounter (HOSPITAL_COMMUNITY): Payer: Self-pay | Admitting: Surgery

## 2023-02-27 LAB — CBC
HCT: 38.9 % — ABNORMAL LOW (ref 39.0–52.0)
Hemoglobin: 13.1 g/dL (ref 13.0–17.0)
MCH: 31 pg (ref 26.0–34.0)
MCHC: 33.7 g/dL (ref 30.0–36.0)
MCV: 92.2 fL (ref 80.0–100.0)
Platelets: 285 10*3/uL (ref 150–400)
RBC: 4.22 MIL/uL (ref 4.22–5.81)
RDW: 12.3 % (ref 11.5–15.5)
WBC: 11.6 10*3/uL — ABNORMAL HIGH (ref 4.0–10.5)
nRBC: 0 % (ref 0.0–0.2)

## 2023-02-27 LAB — BASIC METABOLIC PANEL
Anion gap: 9 (ref 5–15)
BUN: 7 mg/dL — ABNORMAL LOW (ref 8–23)
CO2: 28 mmol/L (ref 22–32)
Calcium: 8.6 mg/dL — ABNORMAL LOW (ref 8.9–10.3)
Chloride: 98 mmol/L (ref 98–111)
Creatinine, Ser: 0.56 mg/dL — ABNORMAL LOW (ref 0.61–1.24)
GFR, Estimated: >60 mL/min (ref >60)
Glucose, Bld: 156 mg/dL — ABNORMAL HIGH (ref 70–99)
Potassium: 3.8 mmol/L (ref 3.5–5.1)
Sodium: 135 mmol/L (ref 135–145)

## 2023-02-27 LAB — GLUCOSE, CAPILLARY
Glucose-Capillary: 152 mg/dL — ABNORMAL HIGH (ref 70–99)
Glucose-Capillary: 178 mg/dL — ABNORMAL HIGH (ref 70–99)
Glucose-Capillary: 175 mg/dL — ABNORMAL HIGH (ref 70–99)
Glucose-Capillary: 129 mg/dL — ABNORMAL HIGH (ref 70–99)

## 2023-02-27 LAB — SURGICAL PATHOLOGY

## 2023-02-27 MED ORDER — ALUM & MAG HYDROXIDE-SIMETH 200-200-20 MG/5ML PO SUSP
30.0000 mL | Freq: Four times a day (QID) | ORAL | Status: DC | PRN
  Administered 2023-02-27: 30 mL via ORAL
  Filled 2023-02-27: qty 30

## 2023-02-27 MED ORDER — METHOCARBAMOL 500 MG PO TABS
1000.0000 mg | ORAL_TABLET | Freq: Four times a day (QID) | ORAL | Status: DC
  Administered 2023-02-27 – 2023-03-02 (×8): 1000 mg via ORAL
  Filled 2023-02-27 (×9): qty 2

## 2023-02-27 MED ORDER — OXYCODONE HCL 5 MG PO TABS
10.0000 mg | ORAL_TABLET | ORAL | Status: DC | PRN
  Administered 2023-02-27 – 2023-02-28 (×3): 10 mg via ORAL
  Filled 2023-02-27 (×4): qty 2

## 2023-02-27 MED ORDER — IBUPROFEN 400 MG PO TABS
600.0000 mg | ORAL_TABLET | Freq: Three times a day (TID) | ORAL | Status: DC
  Administered 2023-02-27 – 2023-03-02 (×8): 600 mg via ORAL
  Filled 2023-02-27 (×11): qty 1

## 2023-02-27 MED ORDER — LIDOCAINE 5 % EX PTCH
2.0000 | MEDICATED_PATCH | CUTANEOUS | Status: DC
  Administered 2023-02-27 – 2023-02-28 (×2): 2 via TRANSDERMAL
  Filled 2023-02-27 (×4): qty 2

## 2023-02-27 MED ORDER — CHLORHEXIDINE GLUCONATE CLOTH 2 % EX PADS
6.0000 | MEDICATED_PAD | Freq: Every day | CUTANEOUS | Status: DC
  Administered 2023-02-27 – 2023-02-28 (×2): 6 via TOPICAL

## 2023-02-27 MED ORDER — METHOCARBAMOL 1000 MG/10ML IJ SOLN
500.0000 mg | Freq: Four times a day (QID) | INTRAVENOUS | Status: DC
  Filled 2023-02-27: qty 5

## 2023-02-27 NOTE — Consult Note (Signed)
WOC consult received for colostomy education and support. This patient was marked preop by our team. Ostomy teaching will begin 02/28/2023.    Thank you,    Priscella Mann MSN, RN-BC, [employer] [phone]

## 2023-02-27 NOTE — Plan of Care (Signed)
Problem: Education: Goal: Ability to describe self-care measures that may prevent or decrease complications (Diabetes Survival Skills Education) will improve Outcome: Progressing   Problem: Health Behavior/Discharge Planning: Goal: Ability to manage health-related needs will improve Outcome: Progressing

## 2023-02-27 NOTE — Plan of Care (Signed)
Problem: Education: Goal: Ability to describe self-care measures that may prevent or decrease complications (Diabetes Survival Skills Education) will improve Outcome: Progressing   Problem: Nutritional: Goal: Maintenance of adequate nutrition will improve Outcome: Progressing   Problem: Education: Goal: Knowledge of General Education information will improve Description: Including pain rating scale, medication(s)/side effects and non-pharmacologic comfort measures Outcome: Progressing

## 2023-02-27 NOTE — Progress Notes (Signed)
[facility] Progress Note  1 Day Post-Op  Subjective: CC:  Reports incisional pain. Has not been OOB. States prior to surgery oxy 5mg  and robaxin were helping but oxy 10mg  is not helping his pain now. Tolerating CLD.  Objective: Vital signs in last 24 hours: Temp:  [97.5 F (36.4 C)-98.2 F (36.8 C)] 98.2 F (36.8 C) (09/24 0524) Pulse Rate:  [85-116] 100 (09/24 0524) Resp:  [9-24] 16 (09/24 0524) BP: (116-180)/(75-87) 121/75 (09/24 0524) SpO2:  [94 %-100 %] 96 % (09/24 0524) Last BM Date : 02/25/23  Intake/Output from previous day: 09/23 0701 - 09/24 0700 In: 2761.6 [P.O.:360; I.V.:2101.6; IV Piggyback:300] Out: 1725 [Urine:1475; Blood:250] Intake/Output this shift: Total I/O In: -  Out: 225 [Urine:225]  PE: Gen:  Alert, NAD, pleasant Card:  Regular rate and rhythm Pulm:  Normal effort ORA Abd: Soft, appropriately tender, nondistended, incision c/d/I w/ honeycomb, LLQ colostomy with red edematous stoma that is viable - SS drainage in pouch, no gas or stool GU: foley in place draining straw colored urine Skin: warm and dry, no rashes  Psych: A&Ox3   Lab Results:  Recent Labs    02/25/23 0339 02/27/23 0442  WBC 7.7 11.6*  HGB 13.4 13.1  HCT 40.2 38.9*  PLT 294 285   BMET Recent Labs    02/27/23 0442  NA 135  K 3.8  CL 98  CO2 28  GLUCOSE 156*  BUN 7*  CREATININE 0.56*  CALCIUM 8.6*   PT/INR No results for input(s): "LABPROT", "INR" in the last 72 hours. CMP     Component Value Date/Time   NA 135 02/27/2023 0442   K 3.8 02/27/2023 0442   CL 98 02/27/2023 0442   CO2 28 02/27/2023 0442   GLUCOSE 156 (H) 02/27/2023 0442   BUN 7 (L) 02/27/2023 0442   CREATININE 0.56 (L) 02/27/2023 0442   CALCIUM 8.6 (L) 02/27/2023 0442   PROT 8.4 (H) 02/20/2023 1148   ALBUMIN 3.4 (L) 02/20/2023 1148   AST 14 (L) 02/20/2023 1148   ALT 15 02/20/2023 1148   ALKPHOS 58 02/20/2023 1148   BILITOT 0.7 02/20/2023 1148   GFRNONAA >60 02/27/2023 0442    GFRAA >60 05/01/2019 2310   Lipase  No results found for: "LIPASE"     Studies/Results: No results found.  Anti-infectives: Anti-infectives (From admission, onward)    Start     Dose/Rate Route Frequency Ordered Stop   02/21/23 0200  meropenem (MERREM) 1 g in sodium chloride 0.9 % 100 mL IVPB        1 g 200 mL/hr over 30 Minutes Intravenous Every 8 hours 02/20/23 1719 02/27/23 1959   02/20/23 1645  meropenem (MERREM) 1 g in sodium chloride 0.9 % 100 mL IVPB        1 g 200 mL/hr over 30 Minutes Intravenous NOW 02/20/23 1644 02/20/23 1815        Assessment/Plan  Sigmoid diverticulitis, failed non-operative management  S/p  EXPLORATORY LAPAROTOMY; PARTIAL COLECTOMY; COLOSTOMY (HARTMAN'S PROCEDURE) 9/23 Dr. Magnus Ivan Intra-op findings: sigmoid diverticulitis with phlegmon and adherent small bowel - POD#1, afebrile, VSS - WBC 11.6 - abdomen soft, no nausea, tolerating clears. Advance to FLD and await bowel function. On entereg. - WOC RN consult for new end colostomy  - OOB and IS    FEN: FLD, SLIV ID: Merrem (allergic to PCN); tentative stop date 9/27 @ 2359 VTE: SCD's, lovenox daily Foley: remove today POD#1  Dispo: med-surg, await bowel function, ostomy teaching   Hx  sigmoid diverticular stricture  Psoriatic arthritis on Remicade (last dose ~6-7 weeks pre-op) Hidradenitis suppurative Hx bladder cancer    LOS: 7 days   I reviewed nursing notes, last 24 h vitals and pain scores, last 48 h intake and output, last 24 h labs and trends, and last 24 h imaging results.   Hosie Spangle, PA-C [facility] Please see Amion for pager number during day hours 7:00am-4:30pm

## 2023-02-28 LAB — GLUCOSE, CAPILLARY
Glucose-Capillary: 133 mg/dL — ABNORMAL HIGH (ref 70–99)
Glucose-Capillary: 162 mg/dL — ABNORMAL HIGH (ref 70–99)
Glucose-Capillary: 144 mg/dL — ABNORMAL HIGH (ref 70–99)
Glucose-Capillary: 144 mg/dL — ABNORMAL HIGH (ref 70–99)

## 2023-02-28 MED ORDER — KATE FARMS STANDARD 1.4 PO LIQD
325.0000 mL | Freq: Two times a day (BID) | ORAL | Status: DC
  Administered 2023-02-28 (×2): 325 mL via ORAL
  Filled 2023-02-28 (×6): qty 325

## 2023-02-28 NOTE — Progress Notes (Signed)
[facility] Progress Note  2 Days Post-Op  Subjective: CC:  Pain better controlled. Walked in hall. Voiding without reported symptoms. Reports flatus in ostomy bag.   Objective: Vital signs in last 24 hours: Temp:  [97.7 F (36.5 C)-97.8 F (36.6 C)] 97.7 F (36.5 C) (09/25 0622) Pulse Rate:  [74-90] 74 (09/25 0622) Resp:  [17-18] 17 (09/25 0622) BP: (103-122)/(60-71) 122/71 (09/25 0622) SpO2:  [96 %-99 %] 98 % (09/25 0622) Last BM Date : 02/25/23  Intake/Output from previous day: 09/24 0701 - 09/25 0700 In: 1305.4 [P.O.:720; I.V.:485.4; IV Piggyback:100] Out: 1275 [Urine:1275] Intake/Output this shift: Total I/O In: 480 [P.O.:480] Out: 250 [Urine:250]  PE: Gen:  Alert, NAD, pleasant Card:  Regular rate and rhythm Pulm:  Normal effort ORA Abd: Soft, appropriately tender, nondistended, incision c/d/I w/ honeycomb, LLQ colostomy with red edematous stoma that is viable - SS drainage in pouch, small amt gas in ostomy pouch  GU: foley in place draining straw colored urine Skin: warm and dry, no rashes  Psych: A&Ox3   Lab Results:  Recent Labs    02/27/23 0442  WBC 11.6*  HGB 13.1  HCT 38.9*  PLT 285   BMET Recent Labs    02/27/23 0442  NA 135  K 3.8  CL 98  CO2 28  GLUCOSE 156*  BUN 7*  CREATININE 0.56*  CALCIUM 8.6*   PT/INR No results for input(s): "LABPROT", "INR" in the last 72 hours. CMP     Component Value Date/Time   NA 135 02/27/2023 0442   K 3.8 02/27/2023 0442   CL 98 02/27/2023 0442   CO2 28 02/27/2023 0442   GLUCOSE 156 (H) 02/27/2023 0442   BUN 7 (L) 02/27/2023 0442   CREATININE 0.56 (L) 02/27/2023 0442   CALCIUM 8.6 (L) 02/27/2023 0442   PROT 8.4 (H) 02/20/2023 1148   ALBUMIN 3.4 (L) 02/20/2023 1148   AST 14 (L) 02/20/2023 1148   ALT 15 02/20/2023 1148   ALKPHOS 58 02/20/2023 1148   BILITOT 0.7 02/20/2023 1148   GFRNONAA >60 02/27/2023 0442   GFRAA >60 05/01/2019 2310   Lipase  No results found for:  "LIPASE"     Studies/Results: No results found.  Anti-infectives: Anti-infectives (From admission, onward)    Start     Dose/Rate Route Frequency Ordered Stop   02/21/23 0200  meropenem (MERREM) 1 g in sodium chloride 0.9 % 100 mL IVPB        1 g 200 mL/hr over 30 Minutes Intravenous Every 8 hours 02/20/23 1719 02/27/23 1959   02/20/23 1645  meropenem (MERREM) 1 g in sodium chloride 0.9 % 100 mL IVPB        1 g 200 mL/hr over 30 Minutes Intravenous NOW 02/20/23 1644 02/20/23 1815        Assessment/Plan  Sigmoid diverticulitis, failed non-operative management  S/p  EXPLORATORY LAPAROTOMY; PARTIAL COLECTOMY; COLOSTOMY (HARTMAN'S PROCEDURE) 9/23 Dr. Magnus Ivan Intra-op findings: sigmoid diverticulitis with phlegmon and adherent small bowel - POD#2, afebrile, VSS - CBC/BMP ordered for tomorrow  - having flatus via colostomy, await stool.  - WOC RN consult for new end colostomy, they will see him today.  - OOB and IS    FEN: Soft diet. SLIV ID: Merrem (allergic to PCN); tentative stop date 9/27 @ 2359 VTE: SCD's, lovenox daily Foley: removed, voiding Dispo: med-surg, await bowel function, ostomy teaching   Hx sigmoid diverticular stricture  Psoriatic arthritis on Remicade (last dose ~6-7 weeks pre-op) Hidradenitis suppurative Hx bladder  cancer    LOS: 8 days   I reviewed nursing notes, last 24 h vitals and pain scores, last 48 h intake and output, last 24 h labs and trends, and last 24 h imaging results.   Hosie Spangle, PA-C [facility] Please see Amion for pager number during day hours 7:00am-4:30pm

## 2023-02-28 NOTE — Consult Note (Addendum)
WOC Nurse ostomy consult note Stoma type/location:  LLQ colostomy  Stomal assessment/size: 1 3/8" almost flush with skin, pink moist, dark hemorrhagic material at 5-7 o'clock, slightly oval  Peristomal assessment: noted to have significant crease at 9 o'clock, midline incision with staples intact, trimmed skin barrier to avoid incision  Treatment options for stomal/peristomal skin: skin prep and 2" barrier ring  Output minimal bloody effluent in pouch  Ostomy pouching: will try 1 piece convex [place] [phone]  Education provided: Patient, wife and neighbor engaged in teaching session.  Educated on emptying pouch when 1/3 to 1/2 full.  Wife was able to demonstrate emptying pouch using lock and roll mechanism, cleaning spout with toilet paper wick and closing pouch.  We discussed changing pouch 2 times a week and as needed for leaking.  Demonstrated push pull technique for removing pouch.  We discussed that stoma is insensate but abdomen is tender postop and that discomfort patient feels will not be permanent.  We discussed cleaning around stoma with water moistened washcloth only.  No baby wipes, lotions, powders.  Demonstrated sizing stoma today and recommended to size with each pouch change for first month as edema in abdomen and stoma will be subsiding.  Sized at 1 3/8" slightly oval.  Had wife cut out new skin barrier at 1 3/8".  Demonstrated stretching 2" barrier ring and placing snugly around stoma.  Wife was then able to secure new pouch over barrier ring.  Wife removed tape edges and smoothed out skin barrier.  Wife closed new pouch.    We discussed emptying pouch in toilet at home and showering with pouch on or off if day to change pouching system.  Patient has taken Benefiber ongoing for constipation.  Encouraged patient and spouse to discuss with surgical team supplementation/laxative use going forward.    We reviewed all educational materials including hand out on changing a 1 and 2 piece system.  Also reviewed educational videos for patient to watch.  Went over hand out for ostomy clinic.  Answered all patient and families questions.    Supplies ordered for room (6) sets of 2 1/4" convex skin barrier [place] [phone]), 2 1/4" pouch [place] [phone]) and 2" barrier ring [place] [phone]). Will also order a large ostomy belt [place] [phone]).    Enrolled patient in [facility] DC program: Yes today   WOC team will plan another teaching session Friday 03/02/2023 at 12:15.  Thank you,    Priscella Mann MSN, RN-BC, [employer] [phone]

## 2023-03-01 LAB — BASIC METABOLIC PANEL
Anion gap: 9 (ref 5–15)
BUN: 10 mg/dL (ref 8–23)
CO2: 28 mmol/L (ref 22–32)
Calcium: 8.5 mg/dL — ABNORMAL LOW (ref 8.9–10.3)
Chloride: 100 mmol/L (ref 98–111)
Creatinine, Ser: 0.59 mg/dL — ABNORMAL LOW (ref 0.61–1.24)
GFR, Estimated: >60 mL/min (ref >60)
Glucose, Bld: 146 mg/dL — ABNORMAL HIGH (ref 70–99)
Potassium: 3.6 mmol/L (ref 3.5–5.1)
Sodium: 137 mmol/L (ref 135–145)

## 2023-03-01 LAB — CBC
HCT: 36.9 % — ABNORMAL LOW (ref 39.0–52.0)
Hemoglobin: 12.3 g/dL — ABNORMAL LOW (ref 13.0–17.0)
MCH: 31.5 pg (ref 26.0–34.0)
MCHC: 33.3 g/dL (ref 30.0–36.0)
MCV: 94.6 fL (ref 80.0–100.0)
Platelets: 288 10*3/uL (ref 150–400)
RBC: 3.9 MIL/uL — ABNORMAL LOW (ref 4.22–5.81)
RDW: 12.5 % (ref 11.5–15.5)
WBC: 10.7 10*3/uL — ABNORMAL HIGH (ref 4.0–10.5)
nRBC: 0 % (ref 0.0–0.2)

## 2023-03-01 LAB — GLUCOSE, CAPILLARY
Glucose-Capillary: 146 mg/dL — ABNORMAL HIGH (ref 70–99)
Glucose-Capillary: 221 mg/dL — ABNORMAL HIGH (ref 70–99)
Glucose-Capillary: 126 mg/dL — ABNORMAL HIGH (ref 70–99)
Glucose-Capillary: 195 mg/dL — ABNORMAL HIGH (ref 70–99)
Glucose-Capillary: 256 mg/dL — ABNORMAL HIGH (ref 70–99)

## 2023-03-01 LAB — MAGNESIUM: Magnesium: 1.9 mg/dL (ref 1.7–2.4)

## 2023-03-01 MED ORDER — MELATONIN 3 MG PO TABS
3.0000 mg | ORAL_TABLET | Freq: Every day | ORAL | Status: DC
  Administered 2023-03-01: 3 mg via ORAL
  Filled 2023-03-01: qty 1

## 2023-03-01 NOTE — Progress Notes (Signed)
Patient had 1st bm in colostomy.

## 2023-03-01 NOTE — Progress Notes (Signed)
[facility] Progress Note  3 Days Post-Op  Subjective: CC:  Cc is poor sleep. Benadryl did not work. Pain is controlled and patient says he is no longer using narcotics. Tolerating Soft diet. +flatus. No stool yet. Worked with WOC RN yesterday. Plans to have help from is wife/daughter.  Objective: Vital signs in last 24 hours: Temp:  [97.5 F (36.4 C)-98.2 F (36.8 C)] 98.2 F (36.8 C) (09/26 0421) Pulse Rate:  [87-90] 88 (09/26 0421) Resp:  [16-18] 16 (09/26 0421) BP: (116-134)/(68-75) 127/69 (09/26 0421) SpO2:  [100 %] 100 % (09/26 0421) Last BM Date : 02/25/23  Intake/Output from previous day: 09/25 0701 - 09/26 0700 In: 1200 [P.O.:1200] Out: 700 [Urine:700] Intake/Output this shift: Total I/O In: 240 [P.O.:240] Out: 400 [Urine:400]  PE: Gen:  Alert, NAD, pleasant Card:  Regular rate and rhythm Pulm:  Normal effort ORA Abd: Soft, appropriately tender, nondistended, incision c/d/I w/ honeycomb, LLQ colostomy with red edematous stoma that is viable - SS drainage in pouch, small amt gas in ostomy pouch  Skin: warm and dry, no rashes  Psych: A&Ox3   Lab Results:  Recent Labs    02/27/23 0442 03/01/23 0423  WBC 11.6* 10.7*  HGB 13.1 12.3*  HCT 38.9* 36.9*  PLT 285 288   BMET Recent Labs    02/27/23 0442 03/01/23 0423  NA 135 137  K 3.8 3.6  CL 98 100  CO2 28 28  GLUCOSE 156* 146*  BUN 7* 10  CREATININE 0.56* 0.59*  CALCIUM 8.6* 8.5*   PT/INR No results for input(s): "LABPROT", "INR" in the last 72 hours. CMP     Component Value Date/Time   NA 137 03/01/2023 0423   K 3.6 03/01/2023 0423   CL 100 03/01/2023 0423   CO2 28 03/01/2023 0423   GLUCOSE 146 (H) 03/01/2023 0423   BUN 10 03/01/2023 0423   CREATININE 0.59 (L) 03/01/2023 0423   CALCIUM 8.5 (L) 03/01/2023 0423   PROT 8.4 (H) 02/20/2023 1148   ALBUMIN 3.4 (L) 02/20/2023 1148   AST 14 (L) 02/20/2023 1148   ALT 15 02/20/2023 1148   ALKPHOS 58 02/20/2023 1148   BILITOT 0.7  02/20/2023 1148   GFRNONAA >60 03/01/2023 0423   GFRAA >60 05/01/2019 2310   Lipase  No results found for: "LIPASE"     Studies/Results: No results found.  Anti-infectives: Anti-infectives (From admission, onward)    Start     Dose/Rate Route Frequency Ordered Stop   02/21/23 0200  meropenem (MERREM) 1 g in sodium chloride 0.9 % 100 mL IVPB        1 g 200 mL/hr over 30 Minutes Intravenous Every 8 hours 02/20/23 1719 02/27/23 1959   02/20/23 1645  meropenem (MERREM) 1 g in sodium chloride 0.9 % 100 mL IVPB        1 g 200 mL/hr over 30 Minutes Intravenous NOW 02/20/23 1644 02/20/23 1815        Assessment/Plan  Sigmoid diverticulitis, failed non-operative management  S/p  EXPLORATORY LAPAROTOMY; PARTIAL COLECTOMY; COLOSTOMY (HARTMAN'S PROCEDURE) 9/23 Dr. Magnus Ivan Intra-op findings: sigmoid diverticulitis with phlegmon and adherent small bowel - POD#3, afebrile, VSS - WBC 10.7 from 11.6, BMP unremarkable, Mg 1.9 - having flatus via colostomy, await stool.  - WOC RN following for new end colostomy. I have ordered [facility] RN for new colostomy and he will need referral to ostomy clinic upon discharge.  - OOB and IS    FEN: Soft diet. Ok to remove IV.  ID: Merrem (allergic to PCN); tentative stop date 9/27 @ 2359 VTE: SCD's, lovenox daily Foley: removed, voiding Dispo: med-surg, await bowel function, ostomy teaching   Hx sigmoid diverticular stricture  Psoriatic arthritis on Remicade (last dose ~6-7 weeks pre-op) Hidradenitis suppurative Hx bladder cancer    LOS: 9 days   I reviewed nursing notes, last 24 h vitals and pain scores, last 48 h intake and output, last 24 h labs and trends, and last 24 h imaging results.   Hosie Spangle, PA-C [facility] Please see Amion for pager number during day hours 7:00am-4:30pm

## 2023-03-01 NOTE — Discharge Instructions (Signed)
[facility]      [facility] °[phone] ° °OPEN ABDOMINAL SURGERY: POST OP INSTRUCTIONS ° °Always review your discharge instruction sheet given to you by the facility where your surgery was performed. ° °IF YOU HAVE DISABILITY OR FAMILY LEAVE FORMS, YOU MUST BRING THEM TO THE OFFICE FOR PROCESSING.  PLEASE DO NOT GIVE THEM TO YOUR DOCTOR. ° °A prescription for pain medication may be given to you upon discharge.  Take your pain medication as prescribed, if needed.  If narcotic pain medicine is not needed, then you may take acetaminophen (Tylenol) or ibuprofen (Advil) as needed. °Take your usually prescribed medications unless otherwise directed. °If you need a refill on your pain medication, please contact your pharmacy. They will contact our office to request authorization.  Prescriptions will not be filled after 5pm or on week-ends. °You should follow a light diet the first few days after arrival home, such as soup and crackers, pudding, etc.unless your doctor has advised otherwise. A high-fiber, low fat diet can be resumed as tolerated.   Be sure to include lots of fluids daily. Most patients will experience some swelling and bruising on the chest and neck area.  Ice packs will help.  Swelling and bruising can take several days to resolve °Most patients will experience some swelling and bruising in the area of the incision. Ice pack will help. Swelling and bruising can take several days to resolve..  °It is common to experience some constipation if taking pain medication after surgery.  Increasing fluid intake and taking a stool softener will usually help or prevent this problem from occurring.  A mild laxative (Milk of Magnesia or Miralax) should be taken according to package directions if there are no bowel movements after 48 hours. ° You may have steri-strips (small skin tapes) in place directly over the incision.  These strips should be left on the skin for 7-10 days.  If your surgeon used skin  glue on the incision, you may shower in 24 hours.  The glue will flake off over the next 2-3 weeks.  Any sutures or staples will be removed at the office during your follow-up visit. You may find that a light gauze bandage over your incision may keep your staples from being rubbed or pulled. You may shower and replace the bandage daily. °ACTIVITIES:  You may resume regular (light) daily activities beginning the next day--such as daily self-care, walking, climbing stairs--gradually increasing activities as tolerated.  You may have sexual intercourse when it is comfortable.  Refrain from any heavy lifting or straining until approved by your doctor. °You may drive when you no longer are taking prescription pain medication, you can comfortably wear a seatbelt, and you can safely maneuver your car and apply brakes ° °You should see your doctor in the office for a follow-up appointment approximately two weeks after your surgery.  Make sure that you call for this appointment within a day or two after you arrive home to insure a convenient appointment time. ° °WHEN TO CALL YOUR DOCTOR: °Fever over 101.0 °Inability to urinate °Nausea and/or vomiting °Extreme swelling or bruising °Continued bleeding from incision. °Increased pain, redness, or drainage from the incision. °Difficulty swallowing or breathing °Muscle cramping or spasms. °Numbness or tingling in hands or feet or around lips. ° °The clinic staff is available to answer your questions during regular business hours.  Please don’t hesitate to call and ask to speak to one of the nurses if you have concerns. ° °For   further questions, please visit [email or web address]  °

## 2023-03-02 LAB — GLUCOSE, CAPILLARY
Glucose-Capillary: 150 mg/dL — ABNORMAL HIGH (ref 70–99)
Glucose-Capillary: 235 mg/dL — ABNORMAL HIGH (ref 70–99)

## 2023-03-02 MED ORDER — POLYETHYLENE GLYCOL 3350 17 G PO PACK: 17.0000 g | PACK | Freq: Every day | ORAL | 0 refills | Status: AC | PRN

## 2023-03-02 MED ORDER — ACETAMINOPHEN 500 MG PO TABS: 1000.0000 mg | ORAL_TABLET | Freq: Four times a day (QID) | ORAL | 0 refills | Status: AC | PRN

## 2023-03-02 MED ORDER — IBUPROFEN 600 MG PO TABS: 600.0000 mg | ORAL_TABLET | Freq: Three times a day (TID) | ORAL | 0 refills | Status: AC | PRN

## 2023-03-02 MED ORDER — SIMETHICONE 80 MG PO CHEW: 40.0000 mg | CHEWABLE_TABLET | Freq: Four times a day (QID) | ORAL | 0 refills | Status: DC | PRN

## 2023-03-02 MED ORDER — METHOCARBAMOL 1000 MG PO TABS: 1000.0000 mg | ORAL_TABLET | Freq: Four times a day (QID) | ORAL | 0 refills | Status: DC | PRN

## 2023-03-02 NOTE — Discharge Summary (Signed)
[facility] Discharge Summary   Patient ID: Joshua [name] MRN: [id number] DOB/AGE: Apr 27, 1956 67 y.o.  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  Procedures Dr. Magnus Ivan 02/26/23 exploratory laparotomy, partial colectomy, end colostomy   HPI:  Joshua [name] is a 67 y.o. 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 Cliffton Asters in our office. He has been having worsening abdominal pain over the last 3 weeks. He had a CT scan 01/19/23 that showed inflammatory changes surrounding the mid sigmoid colon compatible with acute diverticulitis. He has completed courses of cipro/flagyl, levaquin, and doxycycline but continues to hurt. Pain is LLQ. Worse with PO intake or after a bowel movement. Some nausea, no emesis. Poor appetite and he has noticed a 30lb weight loss since July. Initially had some constipation that improved with miralax. Denies blood in stool. States that this has probably been going on since March, but he was initially able to mitigate his symptoms at home with bowel rest.  In the ED today he is found to be hemodynamically stable. WBC 9.0. CT scan is  pending.   Last colonoscopy 11/04/20: intrinsic stenosis was found in the distal sigmoid colon and was traversed, diverticula in the sigmoid colon Abdominal surgical history: none Anticoagulants: none Smokes cigars on the weekend Drinks alcohol occasionally  Hospital Course:  He was admitted and started on IV antibiotics. He continued to have pain, worse with PO intake, and ultimately failed non-operative treatment. He underwent the above operation on 9/23 by Dr. Magnus Ivan and was transferred back to the floor. Pathology negative for malignancy (diverticulitis with abscess). Diet advanced as tolerated. His bowel function returned via end colostomy. He and his wife/daughter worked with the wound ostomy nurses for ostomy education. On 9/27 his vitals were stable, pain controlled, tolerating PO, walking independently, and felt stable for discharge.   Physical Exam: General:  Alert, NAD, pleasant, comfortable Abd:  Soft, ND, mild tenderness, incision C/D/I with staples, stoma viable, pouch with soft stool and gas in pouch.   Allergies as of 03/02/2023       Reactions   Adalimumab Other (See Comments)   Penicillin G Other (See Comments)   Penicillins Hives   And fever blisters        Medication List     STOP taking these medications    doxycycline 100 MG tablet Commonly known as: VIBRA-TABS   metroNIDAZOLE 500 MG tablet Commonly known as: FLAGYL   naproxen sodium 220 MG tablet Commonly known as: ALEVE       TAKE these medications    acetaminophen 500 MG tablet Commonly known as: TYLENOL Take 2 tablets (1,000 mg total) by mouth every 6 (six) hours as needed. What changed:  medication strength how much to take reasons to  take this   Bayer Microlet Lancets lancets Check Blood Sugar Once Daily.   clindamycin 1 % lotion Commonly known as: CLEOCIN T Apply 1 application  topically daily.   dicyclomine 20 MG tablet Commonly known as: BENTYL Take 1 tablet (20 mg total) by  mouth every 6 (six) hours as needed for spasms.   ibuprofen 600 MG tablet Commonly known as: ADVIL Take 1 tablet (600 mg total) by mouth every 8 (eight) hours as needed for up to 7 days. What changed:  medication strength how much to take when to take this reasons to take this   inFLIXimab-axxq 100 MG injection Commonly known as: AVSOLA 100 mg by Intravenous (Continuous Infusion) route See admin instructions. Infusion every 8 weeks   Jardiance 10 MG Tabs tablet Generic drug: empagliflozin TAKE 1 TABLET BY MOUTH DAILY   Methocarbamol 1000 MG Tabs Take 1,000 mg by mouth every 6 (six) hours as needed for muscle spasms.   OneTouch Ultra test strip Generic drug: glucose blood USE AS DIRECTED TO CHECK BLOOD GLUCOSE ONE TIME DAILY   polyethylene glycol 17 g packet Commonly known as: MIRALAX / GLYCOLAX Take 17 g by mouth daily as needed for mild constipation.   simethicone 80 MG chewable tablet Commonly known as: MYLICON Chew 0.5 tablets (40 mg total) by mouth every 6 (six) hours as needed for flatulence (bloating).          Follow-up Information     [name], MD. Go on 03/26/2023.   Specialty: General Surgery Why: 10 AM, please arrive 15 min prior to appointment time. Contact information: [address] [place] [address] [phone]         [facility] [place]. Go on 03/12/2023.   Specialty: General Surgery Why: 10:15 AM, RN visit for staple removal. Please arrive 20 min early to check in. Contact information: [address] [place] [address] [phone]         [facility] Follow up.   Why: to provide home health nursing visits                Signed: Hosie Spangle, [facility] 03/02/2023, 1:28 PM

## 2023-03-02 NOTE — TOC Transition Note (Signed)
Transition of Care [facility]) - CM/SW Discharge Note   Patient Details  Name: Joshua [name] MRN: [id number] Date of Birth: Nov 01, 1955  [facility] CM/SW Contact:  Amada Jupiter, LCSW Phone Number: 03/02/2023, 10:58 AM   Clinical Narrative:    Met with pt to review orders placed for [facility].  Pt agreeable and no agency preference.  Able to secure coverage with [facility] with a start of care on Monday 9/30 - PA aware.  No further TOC needs.   Final next level of care: Home w Home Health Services Barriers to Discharge: No Barriers Identified   Patient Goals and CMS Choice      Discharge Placement                         Discharge Plan and Services Additional resources added to the After Visit Summary for                  DME Arranged: N/A DME Agency: NA       HH Arranged: RN HH Agency: [facility] ([facility] Date HH Agency Contacted: 03/02/23 Time HH Agency Contacted: 1045 Representative spoke with at [facility] Agency: Duwaine Maxin  Social Determinants of Health (SDOH) Interventions SDOH Screenings   Food Insecurity: No Food Insecurity (02/20/2023)  Housing: Low Risk  (02/20/2023)  Transportation Needs: No Transportation Needs (02/20/2023)  Utilities: Not At Risk (02/20/2023)  Alcohol Screen: Low Risk  (08/27/2022)  Depression (PHQ2-9): Low Risk  (02/02/2023)  Financial Resource Strain: Low Risk  (08/27/2022)  Physical Activity: Unknown (08/27/2022)  Recent Concern: Physical Activity - Inactive (08/27/2022)  Social Connections: Socially Integrated (08/27/2022)  Stress: No Stress Concern Present (08/27/2022)  Tobacco Use: High Risk (02/26/2023)     Readmission Risk Interventions    03/02/2023   10:44 AM 02/21/2023    3:13 PM  Readmission Risk Prevention Plan  Post Dischage Appt Complete Complete  Medication Screening Complete Complete  Transportation Screening Complete Complete

## 2023-03-02 NOTE — Consult Note (Signed)
WOC Nurse ostomy follow up Stoma type/location: LLQ colostomy  Stomal assessment/size: 1 1/4" almost flush, pink moist, slightly oval  Peristomal assessment: patient has significant crease at 9 o'clock and skin overhanging stoma when he stands, midline incision to R of stoma with staples intact; trimmed skin barrier to avoid incision  Treatment options for stomal/peristomal skin: skin prep and 2" barrier ring  Output approximately 50 mls soft brown stool  Ostomy pouching: 2 piece 2 1/4" convex [place] [phone]), 2 1/4" pouch [place] [phone]) and 2" barrier ring [place] [phone]) placed today (1 piece convex remained in place with no leaking)  Education provided: Patient ambulatory to bathroom to stand in front of mirror. Patient able to remove his own pouch using push pull method.  Patient has been emptying pouch and feels confident in this. Reviewed emptying when 1/3 to 1/2 full.  We also reviewed changing pouching system 2 times a week and if leaking.  We cleaned around stoma with water moistened washcloth.  Stoma sized at 1 1/4" slightly oval today.  I cut pattern to 1" then widened out sides.  This pattern placed in bag for home.  Daughter placed 2" barrier ring then placed skin barrier over barrier ring. Daughter snapped pouch to skin barrier and closed pouch.    Enrolled patient in [place] Secure Start Discharge program: Yes  Patient is going home with home health. I have included (5) 2 1/4" convex skin barriers and 2 1/4" pouches and (5) 1 piece convex pouches with barrier rings.  Patient and wife know how to use both of these. I also provided patient with an ostomy belt and some samples of lubricating deodorizing drops.    I did also discuss ostomy clinic with patient, handout given.  Due to abdominal topography and flush stoma patient may have difficulties with leaking and ostomy clinic would be a great resource for him and family.  Referral being made by [facility] PA.    Answered all patient and  family questions. Patient feels comfortable with going home.    Thank you,    Priscella Mann MSN, RN-BC, [employer] [phone]

## 2023-03-02 NOTE — Plan of Care (Signed)
  Problem: Education: Goal: Ability to describe self-care measures that may prevent or decrease complications (Diabetes Survival Skills Education) will improve Outcome: Adequate for Discharge   Problem: Health Behavior/Discharge Planning: Goal: Ability to identify and utilize available resources and services will improve Outcome: Adequate for Discharge Goal: Ability to manage health-related needs will improve Outcome: Adequate for Discharge   Problem: Metabolic: Goal: Ability to maintain appropriate glucose levels will improve Outcome: Adequate for Discharge   Problem: Nutritional: Goal: Maintenance of adequate nutrition will improve Outcome: Adequate for Discharge Goal: Progress toward achieving an optimal weight will improve Outcome: Adequate for Discharge   Problem: Skin Integrity: Goal: Risk for impaired skin integrity will decrease Outcome: Adequate for Discharge   Problem: Tissue Perfusion: Goal: Adequacy of tissue perfusion will improve Outcome: Adequate for Discharge   Problem: Education: Goal: Knowledge of General Education information will improve Description: Including pain rating scale, medication(s)/side effects and non-pharmacologic comfort measures Outcome: Adequate for Discharge   Problem: Health Behavior/Discharge Planning: Goal: Ability to manage health-related needs will improve Outcome: Adequate for Discharge   Problem: Clinical Measurements: Goal: Ability to maintain clinical measurements within normal limits will improve Outcome: Adequate for Discharge Goal: Will remain free from infection Outcome: Adequate for Discharge Goal: Diagnostic test results will improve Outcome: Adequate for Discharge Goal: Respiratory complications will improve Outcome: Adequate for Discharge Goal: Cardiovascular complication will be avoided Outcome: Adequate for Discharge   Problem: Activity: Goal: Risk for activity intolerance will decrease Outcome: Adequate for  Discharge   Problem: Nutrition: Goal: Adequate nutrition will be maintained Outcome: Adequate for Discharge   Problem: Coping: Goal: Level of anxiety will decrease Outcome: Adequate for Discharge   Problem: Elimination: Goal: Will not experience complications related to bowel motility Outcome: Adequate for Discharge   Problem: Pain Managment: Goal: General experience of comfort will improve Outcome: Adequate for Discharge   Problem: Safety: Goal: Ability to remain free from injury will improve Outcome: Adequate for Discharge   Problem: Skin Integrity: Goal: Risk for impaired skin integrity will decrease Outcome: Adequate for Discharge

## 2023-03-02 NOTE — Care Management Important Message (Signed)
Important Message  Patient Details  Name: Joshua [name] MRN: [id number] Date of Birth: [birth year]   Important Message Given:  Yes     Mardene Sayer 03/02/2023, 11:32 AM

## 2023-03-05 DIAGNOSIS — Z48815 Encounter for surgical aftercare following surgery on the digestive system: Secondary | ICD-10-CM | POA: Diagnosis not present

## 2023-03-05 DIAGNOSIS — E119 Type 2 diabetes mellitus without complications: Secondary | ICD-10-CM | POA: Diagnosis not present

## 2023-03-05 DIAGNOSIS — I10 Essential (primary) hypertension: Secondary | ICD-10-CM | POA: Diagnosis not present

## 2023-03-05 DIAGNOSIS — Z433 Encounter for attention to colostomy: Secondary | ICD-10-CM | POA: Diagnosis not present

## 2023-03-05 DIAGNOSIS — L4050 Arthropathic psoriasis, unspecified: Secondary | ICD-10-CM | POA: Diagnosis not present

## 2023-03-05 DIAGNOSIS — Z8551 Personal history of malignant neoplasm of bladder: Secondary | ICD-10-CM | POA: Diagnosis not present

## 2023-03-05 DIAGNOSIS — E785 Hyperlipidemia, unspecified: Secondary | ICD-10-CM | POA: Diagnosis not present

## 2023-03-05 DIAGNOSIS — K603 Anal fistula: Secondary | ICD-10-CM | POA: Diagnosis not present

## 2023-03-05 DIAGNOSIS — Z8616 Personal history of COVID-19: Secondary | ICD-10-CM | POA: Diagnosis not present

## 2023-03-05 DIAGNOSIS — Z9049 Acquired absence of other specified parts of digestive tract: Secondary | ICD-10-CM | POA: Diagnosis not present

## 2023-03-05 DIAGNOSIS — E43 Unspecified severe protein-calorie malnutrition: Secondary | ICD-10-CM | POA: Diagnosis not present

## 2023-03-05 DIAGNOSIS — Z7984 Long term (current) use of oral hypoglycemic drugs: Secondary | ICD-10-CM | POA: Diagnosis not present

## 2023-03-13 ENCOUNTER — Encounter: Payer: Medicare Other | Admitting: Family Medicine

## 2023-03-13 DIAGNOSIS — L4050 Arthropathic psoriasis, unspecified: Secondary | ICD-10-CM | POA: Diagnosis not present

## 2023-03-13 DIAGNOSIS — Z7984 Long term (current) use of oral hypoglycemic drugs: Secondary | ICD-10-CM | POA: Diagnosis not present

## 2023-03-13 DIAGNOSIS — Z433 Encounter for attention to colostomy: Secondary | ICD-10-CM | POA: Diagnosis not present

## 2023-03-13 DIAGNOSIS — Z8551 Personal history of malignant neoplasm of bladder: Secondary | ICD-10-CM | POA: Diagnosis not present

## 2023-03-13 DIAGNOSIS — E785 Hyperlipidemia, unspecified: Secondary | ICD-10-CM | POA: Diagnosis not present

## 2023-03-13 DIAGNOSIS — Z8616 Personal history of COVID-19: Secondary | ICD-10-CM | POA: Diagnosis not present

## 2023-03-13 DIAGNOSIS — I10 Essential (primary) hypertension: Secondary | ICD-10-CM | POA: Diagnosis not present

## 2023-03-13 DIAGNOSIS — Z48815 Encounter for surgical aftercare following surgery on the digestive system: Secondary | ICD-10-CM | POA: Diagnosis not present

## 2023-03-13 DIAGNOSIS — E119 Type 2 diabetes mellitus without complications: Secondary | ICD-10-CM | POA: Diagnosis not present

## 2023-03-13 DIAGNOSIS — Z9049 Acquired absence of other specified parts of digestive tract: Secondary | ICD-10-CM | POA: Diagnosis not present

## 2023-03-13 DIAGNOSIS — E43 Unspecified severe protein-calorie malnutrition: Secondary | ICD-10-CM | POA: Diagnosis not present

## 2023-03-19 DIAGNOSIS — Z8616 Personal history of COVID-19: Secondary | ICD-10-CM

## 2023-03-19 DIAGNOSIS — E785 Hyperlipidemia, unspecified: Secondary | ICD-10-CM

## 2023-03-19 DIAGNOSIS — Z8551 Personal history of malignant neoplasm of bladder: Secondary | ICD-10-CM

## 2023-03-19 DIAGNOSIS — Z433 Encounter for attention to colostomy: Secondary | ICD-10-CM | POA: Diagnosis not present

## 2023-03-19 DIAGNOSIS — Z7984 Long term (current) use of oral hypoglycemic drugs: Secondary | ICD-10-CM

## 2023-03-19 DIAGNOSIS — L4050 Arthropathic psoriasis, unspecified: Secondary | ICD-10-CM

## 2023-03-19 DIAGNOSIS — Z9049 Acquired absence of other specified parts of digestive tract: Secondary | ICD-10-CM

## 2023-03-19 DIAGNOSIS — Z48815 Encounter for surgical aftercare following surgery on the digestive system: Secondary | ICD-10-CM | POA: Diagnosis not present

## 2023-03-19 DIAGNOSIS — K603 Anal fistula, unspecified: Secondary | ICD-10-CM | POA: Diagnosis not present

## 2023-03-19 DIAGNOSIS — E119 Type 2 diabetes mellitus without complications: Secondary | ICD-10-CM | POA: Diagnosis not present

## 2023-03-19 DIAGNOSIS — E43 Unspecified severe protein-calorie malnutrition: Secondary | ICD-10-CM | POA: Diagnosis not present

## 2023-03-19 DIAGNOSIS — I10 Essential (primary) hypertension: Secondary | ICD-10-CM

## 2023-03-26 DIAGNOSIS — D224 Melanocytic nevi of scalp and neck: Secondary | ICD-10-CM | POA: Diagnosis not present

## 2023-03-26 DIAGNOSIS — L400 Psoriasis vulgaris: Secondary | ICD-10-CM | POA: Diagnosis not present

## 2023-03-26 DIAGNOSIS — D1801 Hemangioma of skin and subcutaneous tissue: Secondary | ICD-10-CM | POA: Diagnosis not present

## 2023-03-26 DIAGNOSIS — L821 Other seborrheic keratosis: Secondary | ICD-10-CM | POA: Diagnosis not present

## 2023-03-26 DIAGNOSIS — L732 Hidradenitis suppurativa: Secondary | ICD-10-CM | POA: Diagnosis not present

## 2023-03-27 DIAGNOSIS — Z8616 Personal history of COVID-19: Secondary | ICD-10-CM | POA: Diagnosis not present

## 2023-03-27 DIAGNOSIS — Z7984 Long term (current) use of oral hypoglycemic drugs: Secondary | ICD-10-CM | POA: Diagnosis not present

## 2023-03-27 DIAGNOSIS — Z48815 Encounter for surgical aftercare following surgery on the digestive system: Secondary | ICD-10-CM | POA: Diagnosis not present

## 2023-03-27 DIAGNOSIS — Z9049 Acquired absence of other specified parts of digestive tract: Secondary | ICD-10-CM | POA: Diagnosis not present

## 2023-03-27 DIAGNOSIS — E119 Type 2 diabetes mellitus without complications: Secondary | ICD-10-CM | POA: Diagnosis not present

## 2023-03-27 DIAGNOSIS — Z8551 Personal history of malignant neoplasm of bladder: Secondary | ICD-10-CM | POA: Diagnosis not present

## 2023-03-27 DIAGNOSIS — Z433 Encounter for attention to colostomy: Secondary | ICD-10-CM | POA: Diagnosis not present

## 2023-03-27 DIAGNOSIS — I10 Essential (primary) hypertension: Secondary | ICD-10-CM | POA: Diagnosis not present

## 2023-03-27 DIAGNOSIS — E785 Hyperlipidemia, unspecified: Secondary | ICD-10-CM | POA: Diagnosis not present

## 2023-03-27 DIAGNOSIS — E43 Unspecified severe protein-calorie malnutrition: Secondary | ICD-10-CM | POA: Diagnosis not present

## 2023-03-27 DIAGNOSIS — L4050 Arthropathic psoriasis, unspecified: Secondary | ICD-10-CM | POA: Diagnosis not present

## 2023-04-02 DIAGNOSIS — E43 Unspecified severe protein-calorie malnutrition: Secondary | ICD-10-CM | POA: Diagnosis not present

## 2023-04-02 DIAGNOSIS — I10 Essential (primary) hypertension: Secondary | ICD-10-CM | POA: Diagnosis not present

## 2023-04-02 DIAGNOSIS — E785 Hyperlipidemia, unspecified: Secondary | ICD-10-CM | POA: Diagnosis not present

## 2023-04-02 DIAGNOSIS — E119 Type 2 diabetes mellitus without complications: Secondary | ICD-10-CM | POA: Diagnosis not present

## 2023-04-02 DIAGNOSIS — Z8551 Personal history of malignant neoplasm of bladder: Secondary | ICD-10-CM | POA: Diagnosis not present

## 2023-04-02 DIAGNOSIS — Z9049 Acquired absence of other specified parts of digestive tract: Secondary | ICD-10-CM | POA: Diagnosis not present

## 2023-04-02 DIAGNOSIS — Z433 Encounter for attention to colostomy: Secondary | ICD-10-CM | POA: Diagnosis not present

## 2023-04-02 DIAGNOSIS — L4050 Arthropathic psoriasis, unspecified: Secondary | ICD-10-CM | POA: Diagnosis not present

## 2023-04-02 DIAGNOSIS — Z8616 Personal history of COVID-19: Secondary | ICD-10-CM | POA: Diagnosis not present

## 2023-04-02 DIAGNOSIS — Z7984 Long term (current) use of oral hypoglycemic drugs: Secondary | ICD-10-CM | POA: Diagnosis not present

## 2023-04-02 DIAGNOSIS — Z48815 Encounter for surgical aftercare following surgery on the digestive system: Secondary | ICD-10-CM | POA: Diagnosis not present

## 2023-04-10 ENCOUNTER — Encounter: Payer: Self-pay | Admitting: Family Medicine

## 2023-04-10 ENCOUNTER — Ambulatory Visit: Payer: Medicare Other | Admitting: Family Medicine

## 2023-04-10 VITALS — BP 120/74 | HR 90 | Temp 98.0°F | Ht 68.0 in | Wt 174.2 lb

## 2023-04-10 DIAGNOSIS — Z125 Encounter for screening for malignant neoplasm of prostate: Secondary | ICD-10-CM | POA: Diagnosis not present

## 2023-04-10 DIAGNOSIS — E1165 Type 2 diabetes mellitus with hyperglycemia: Secondary | ICD-10-CM | POA: Diagnosis not present

## 2023-04-10 DIAGNOSIS — I10 Essential (primary) hypertension: Secondary | ICD-10-CM

## 2023-04-10 DIAGNOSIS — D649 Anemia, unspecified: Secondary | ICD-10-CM

## 2023-04-10 DIAGNOSIS — Z7984 Long term (current) use of oral hypoglycemic drugs: Secondary | ICD-10-CM

## 2023-04-10 DIAGNOSIS — R972 Elevated prostate specific antigen [PSA]: Secondary | ICD-10-CM

## 2023-04-10 DIAGNOSIS — K5732 Diverticulitis of large intestine without perforation or abscess without bleeding: Secondary | ICD-10-CM

## 2023-04-10 DIAGNOSIS — E785 Hyperlipidemia, unspecified: Secondary | ICD-10-CM

## 2023-04-10 LAB — COMPREHENSIVE METABOLIC PANEL
ALT: 19 U/L (ref 0–53)
AST: 14 U/L (ref 0–37)
Albumin: 4.1 g/dL (ref 3.5–5.2)
Alkaline Phosphatase: 102 U/L (ref 39–117)
BUN: 18 mg/dL (ref 6–23)
CO2: 27 meq/L (ref 19–32)
Calcium: 10 mg/dL (ref 8.4–10.5)
Chloride: 102 meq/L (ref 96–112)
Creatinine, Ser: 0.84 mg/dL (ref 0.40–1.50)
GFR: 90.22 mL/min (ref >60.00)
Glucose, Bld: 134 mg/dL — ABNORMAL HIGH (ref 70–99)
Potassium: 4.3 meq/L (ref 3.5–5.1)
Sodium: 138 meq/L (ref 135–145)
Total Bilirubin: 0.7 mg/dL (ref 0.2–1.2)
Total Protein: 7.9 g/dL (ref 6.0–8.3)

## 2023-04-10 LAB — CBC WITH DIFFERENTIAL/PLATELET
Basophils Absolute: 0.1 10*3/uL (ref 0.0–0.1)
Basophils Relative: 0.7 % (ref 0.0–3.0)
Eosinophils Absolute: 0.3 10*3/uL (ref 0.0–0.7)
Eosinophils Relative: 3.4 % (ref 0.0–5.0)
HCT: 44.3 % (ref 39.0–52.0)
Hemoglobin: 14.5 g/dL (ref 13.0–17.0)
Lymphocytes Relative: 17.2 % (ref 12.0–46.0)
Lymphs Abs: 1.6 10*3/uL (ref 0.7–4.0)
MCHC: 32.6 g/dL (ref 30.0–36.0)
MCV: 96.8 fL (ref 78.0–100.0)
Monocytes Absolute: 0.6 10*3/uL (ref 0.1–1.0)
Monocytes Relative: 6.5 % (ref 3.0–12.0)
Neutro Abs: 6.7 10*3/uL (ref 1.4–7.7)
Neutrophils Relative %: 72.2 % (ref 43.0–77.0)
Platelets: 307 10*3/uL (ref 150.0–400.0)
RBC: 4.58 Mil/uL (ref 4.22–5.81)
RDW: 14.7 % (ref 11.5–15.5)
WBC: 9.3 10*3/uL (ref 4.0–10.5)

## 2023-04-10 LAB — POCT GLYCOSYLATED HEMOGLOBIN (HGB A1C): Hemoglobin A1C: 6.8 % — AB (ref 4.0–5.6)

## 2023-04-10 LAB — LIPID PANEL
Cholesterol: 175 mg/dL (ref 0–200)
HDL: 49.4 mg/dL (ref >39.00)
LDL Cholesterol: 105 mg/dL — ABNORMAL HIGH (ref 0–99)
NonHDL: 126.06
Total CHOL/HDL Ratio: 4
Triglycerides: 107 mg/dL (ref 0.0–149.0)
VLDL: 21.4 mg/dL (ref 0.0–40.0)

## 2023-04-10 LAB — MICROALBUMIN / CREATININE URINE RATIO
Creatinine,U: 49.7 mg/dL
Microalb Creat Ratio: 1.9 mg/g (ref 0.0–30.0)
Microalb, Ur: 0.9 mg/dL (ref 0.0–1.9)

## 2023-04-10 LAB — PSA: PSA: 3.03 ng/mL (ref 0.10–4.00)

## 2023-04-10 NOTE — Progress Notes (Addendum)
Established Patient Office Visit  Subjective   Patient ID: Joshua [name], male    DOB: [birth year]  Age: 67 y.o. MRN: [id number]  Chief Complaint  Patient presents with   Annual Exam    HPI   Joshua [name] is seen following recent hospitalization for diverticulitis unresponsive to medical therapy and requiring exploratory laparotomy, partial colectomy, and end colostomy on 02-26-2023.  He has history of psoriatic arthritis and takes Remicade.  Other medical problems include history of bladder cancer and type 2 diabetes.  He has done well since his surgery and feels much better overall.  His weight has gone up about 18 pounds and appetite much improved.  He plans to start back his Remicade infusions soon.  Prior to his admission he had multiple outpatient visits and was treated with multiple different courses of antibiotics but continued to worsen.  Follow-up CT scan September 17 showed long segment of thickening and inflammation of the entire sigmoid colon more prominent than prior study.  If pathology did not show any signs of malignancy.  He has planned follow-up with surgery in December for takedown of the colostomy.  He has type 2 diabetes history.  Last A1c was 7.6%.  His blood sugars improved dramatically with the weight loss but started going back up with his weight gain and a couple weeks ago he started back Jardiance.   He has slightly low hemoglobin with last CBC of 12.3 but this was following surgery.  He is requesting follow-up labs including lipid and PSA.  He has declined statin use in the past  Past Medical History:  Diagnosis Date   Anal fistula    Hidradenitis    History of bladder cancer 2009   urologist-- [name]---  s/p TURBT 2010,  recurrence in office 2014 ,  none since per pt   History of colonic polyps    History of COVID-19 04/2019   per pt had covid pneumonia recovered at home, no oxygen, symptoms resolved   History of diverticulitis of colon    History of  hypertension 04/05/2009   per the pt he lost 55 lbs and htn has been reversed   History of kidney stones    Hyperlipidemia 04/05/2009   pt states he does not take med for cholesterol   OA (osteoarthritis) 04/05/2009   Psoriatic arthritis (HCC)    rheumonotologist--- [name]   Type 2 diabetes mellitus (HCC) 04/05/2009   followed by pcp---  (01-31-2021  per pt only checks blood sugar once weekly, last fasting sugar-- 122)   Past Surgical History:  Procedure Laterality Date   COLONOSCOPY     last one 11-04-2020  by gessner   EXTRACORPOREAL SHOCK WAVE LITHOTRIPSY  2015   INCISION AND DRAINAGE ABSCESS N/A 02/03/2021   Procedure: EXCISION OF PERIANAL AND PERINEAL HYDRADENITIS, INTERROGATION OF PERIANAL WOUNDS;  Surgeon: [name], MD;  Location: [facility];  Service: General;  Laterality: N/A;   PARTIAL COLECTOMY N/A 02/26/2023   Procedure: EXPLORATORY LAPAROTOMY; PARTIAL COLECTOMY; COLOSTOMY;  Surgeon: [name], MD;  Location: [facility];  Service: General;  Laterality: N/A;   RECTAL EXAM UNDER ANESTHESIA  02/03/2021   Procedure: ANORECTAL EXAM UNDER ANESTHESIA;  Surgeon: [name], MD;  Location: [facility];  Service: General;;   TONSILLECTOMY     child   TRANSURETHRAL RESECTION OF BLADDER TUMOR  02/2008    reports that he has been smoking cigarettes and cigars. He has never used smokeless tobacco. He  reports that he does not currently use alcohol. He reports that he does not use drugs. family history includes Arthritis in his father; Crohn's disease in his daughter; Diabetes in his mother; Hypertension in his mother. Allergies  Allergen Reactions   Adalimumab Other (See Comments)   Penicillin G Other (See Comments)   Penicillins Hives    And fever blisters     Review of Systems  Constitutional:  Negative for chills, fever and malaise/fatigue.  Eyes:  Negative for blurred vision.  Respiratory:  Negative for shortness of  breath.   Cardiovascular:  Negative for chest pain.  Gastrointestinal:  Negative for abdominal pain, nausea and vomiting.  Neurological:  Negative for dizziness, weakness and headaches.      Objective:     BP 120/74 (BP Location: Left Arm, Patient Position: Sitting, Cuff Size: Normal)   Pulse 90   Temp 98 F (36.7 C) (Oral)   Ht 5\' 8"  (1.727 m)   Wt 174 lb 3.2 oz (79 kg)   SpO2 99%   BMI 26.49 kg/m  BP Readings from Last 3 Encounters:  04/10/23 120/74  03/02/23 129/75  02/08/23 110/60   Wt Readings from Last 3 Encounters:  04/10/23 174 lb 3.2 oz (79 kg)  02/20/23 164 lb (74.4 kg)  02/08/23 171 lb (77.6 kg)      Physical Exam Vitals reviewed.  Constitutional:      General: He is not in acute distress. Cardiovascular:     Rate and Rhythm: Normal rate and regular rhythm.  Pulmonary:     Effort: Pulmonary effort is normal.     Breath sounds: Normal breath sounds.  Abdominal:     Comments: Abdomen is nondistended.  Normal bowel sounds.  Soft and nontender.  Colostomy bag in place.  Musculoskeletal:     Right lower leg: No edema.     Left lower leg: No edema.  Neurological:     Mental Status: He is alert.      Results for orders placed or performed in visit on 04/10/23  POC HgB A1c  Result Value Ref Range   Hemoglobin A1C 6.8 (A) 4.0 - 5.6 %   HbA1c POC (<> result, manual entry)     HbA1c, POC (prediabetic range)     HbA1c, POC (controlled diabetic range)      Last CBC Lab Results  Component Value Date   WBC 10.7 (H) 03/01/2023   HGB 12.3 (L) 03/01/2023   HCT 36.9 (L) 03/01/2023   MCV 94.6 03/01/2023   MCH 31.5 03/01/2023   RDW 12.5 03/01/2023   PLT 288 03/01/2023   Last metabolic panel Lab Results  Component Value Date   GLUCOSE 146 (H) 03/01/2023   NA 137 03/01/2023   K 3.6 03/01/2023   CL 100 03/01/2023   CO2 28 03/01/2023   BUN 10 03/01/2023   CREATININE 0.59 (L) 03/01/2023   GFRNONAA >60 03/01/2023   CALCIUM 8.5 (L) 03/01/2023   PROT  8.4 (H) 02/20/2023   ALBUMIN 3.4 (L) 02/20/2023   BILITOT 0.7 02/20/2023   ALKPHOS 58 02/20/2023   AST 14 (L) 02/20/2023   ALT 15 02/20/2023   ANIONGAP 9 03/01/2023   Last lipids Lab Results  Component Value Date   CHOL 188 04/12/2021   HDL 42.10 04/12/2021   LDLCALC 119 (H) 04/12/2021   LDLDIRECT 131.0 09/02/2018   TRIG 136.0 04/12/2021   CHOLHDL 4 04/12/2021   Last hemoglobin A1c Lab Results  Component Value Date   HGBA1C 6.8 (A)  04/10/2023      The 10-year ASCVD risk score (Arnett DK, et al., 2019) is: 32.1%    Assessment & Plan:   #1 recent complicated chronic sigmoid diverticulitis with failed nonoperative treatment.  Exploratory laparotomy with partial colectomy and temporary colostomy on 9-23 -24.  He is done well since surgery.  Appetite is improved and weight up 18 pounds.  Plans to have repeat surgery in December for takedown of colostomy. -Joshua [name] is needing colostomy supplies until he gets reversal of his colostomy in December.  #2 type 2 diabetes.  A1c today improved at 6.8%.  This likely reflects somewhat his recent weight loss.  Agree with his going back on Jardiance at this time.  Reassess in 3 months.  Hopefully he can maintain his weight loss  #3 history of hyperlipidemia.  Patient is declined statin use but would like to get repeat lipids today.  He is fasting.  #4 prostate cancer screening.  Requesting PSA today.  This will be drawn.   Return in about 3 months (around 07/11/2023).    Evelena Peat, MD

## 2023-04-10 NOTE — Patient Instructions (Addendum)
A1C today improved to 6.8%.  Keep up the weight maintenance.    Set up 3 month follow up.

## 2023-04-11 NOTE — Addendum Note (Signed)
Addended by: Christy Sartorius on: 04/11/2023 09:13 AM   Modules accepted: Orders

## 2023-04-23 ENCOUNTER — Ambulatory Visit: Payer: Medicare Other | Admitting: Family Medicine

## 2023-04-23 ENCOUNTER — Other Ambulatory Visit: Payer: Self-pay

## 2023-04-23 ENCOUNTER — Ambulatory Visit (INDEPENDENT_AMBULATORY_CARE_PROVIDER_SITE_OTHER): Payer: Medicare Other

## 2023-04-23 VITALS — BP 112/74 | HR 72 | Ht 68.0 in | Wt 176.0 lb

## 2023-04-23 DIAGNOSIS — M19012 Primary osteoarthritis, left shoulder: Secondary | ICD-10-CM | POA: Diagnosis not present

## 2023-04-23 DIAGNOSIS — M25512 Pain in left shoulder: Secondary | ICD-10-CM | POA: Diagnosis not present

## 2023-04-23 DIAGNOSIS — G8929 Other chronic pain: Secondary | ICD-10-CM

## 2023-04-23 NOTE — Patient Instructions (Addendum)
Thank you for coming in today.   Please get an Xray today before you leave   You received an injection today. Seek immediate medical attention if the joint becomes red, extremely painful, or is oozing fluid.   Let me know if not better and I can inject a different part of the shoulder next week

## 2023-04-23 NOTE — Progress Notes (Signed)
Rubin Payor, PhD, LAT, ATC acting as a scribe for Clementeen Graham, MD.  Joshua [name] is a 67 y.o. male who presents to [facility] today for L shoulder pain chronic in nature, worsening over the last 6 months. Pain varies day-to-day. He only has pain w/ certain movements. He is RHD. He reports L shoulder pain limiting him at the gym; unable to do lat pull downs or overhead press.  Radiates: yes- into L arm Aggravates: IR, aBd- esp over 90,  Treatments tried: prior steroid injection 2011, stretch  Pertinent review of systems: No fevers or chills  Relevant historical information: Hypertension.  Diabetes.  History of bladder cancer.  Psoriatic arthritis marginally controlled with Biologics.   Exam:  BP 112/74   Pulse 72   Ht 5\' 8"  (1.727 m)   Wt 176 lb (79.8 kg)   SpO2 98%   BMI 26.76 kg/m  General: Well Developed, well nourished, and in no acute distress.   MSK: Left shoulder normal-appearing Decreased range of motion.  Abduction limited to 100 degrees.  External rotation 20 degrees beyond neutral position internal rotation posterior iliac crest.  Strength intact within limits of motion.  Positive impingement testing.    Lab and Radiology Results  Procedure: Real-time Ultrasound Guided Injection of left shoulder glenohumeral joint posterior approach Device: Philips Affiniti 50G/GE Logiq Images permanently stored and available for review in PACS Ultrasound evaluation prior to injection shows intact rotator cuff tendons without severe subacromial bursitis. Verbal informed consent obtained.  Discussed risks and benefits of procedure. Warned about infection, bleeding, hyperglycemia damage to structures among others. Patient expresses understanding and agreement Time-out conducted.   Noted no overlying erythema, induration, or other signs of local infection.   Skin prepped in a sterile fashion.   Local anesthesia: Topical Ethyl chloride.   With  sterile technique and under real time ultrasound guidance: 40 mg of Kenalog and 2 mL of Marcaine injected into glenohumeral joint. Fluid seen entering the joint capsule.   Completed without difficulty   Pain immediately resolved suggesting accurate placement of the medication.   Advised to call if fevers/chills, erythema, induration, drainage, or persistent bleeding.   Images permanently stored and available for review in the ultrasound unit.  Impression: Technically successful ultrasound guided injection.   X-ray images left shoulder obtained today personally and independently interpreted Moderate glenohumeral DJD.  Moderate AC DJD.  Calcific change present at humeral head consistent with chronic rotator cuff tendinitis.  No acute fractures are visible.  No aggressive appearing bony lesions are present. Await formal radiology review     Assessment and Plan: 67 y.o. male with chronic left shoulder pain thought to be rotator cuff tendinitis or adhesive capsulitis are related to psoriatic arthritis.  Glenohumeral injection did provide pretty good immediate pain relief.  Consider physical therapy referral.  If this does not work we could try a subacromial injection as well.   PDMP not reviewed this encounter. Orders Placed This Encounter  Procedures   DG Shoulder Left    Standing Status:   Future    Number of Occurrences:   1    Standing Expiration Date:   05/23/2023    Order Specific Question:   Reason for Exam (SYMPTOM  OR DIAGNOSIS REQUIRED)    Answer:   left shoulder pain    Order Specific Question:   Preferred imaging location?    Answer:   [facility]   [place] LIMITED JOINT SPACE STRUCTURES UP LEFT(NO  LINKED CHARGES)    Order Specific Question:   Reason for Exam (SYMPTOM  OR DIAGNOSIS REQUIRED)    Answer:   left shoulder pain    Order Specific Question:   Preferred imaging location?    Answer:   [facility]   No orders of the defined types were  placed in this encounter.    Discussed warning signs or symptoms. Please see discharge instructions. Patient expresses understanding.   The above documentation has been reviewed and is accurate and complete Clementeen Graham, M.D.

## 2023-04-25 DIAGNOSIS — Z79899 Other long term (current) drug therapy: Secondary | ICD-10-CM | POA: Diagnosis not present

## 2023-04-25 DIAGNOSIS — L4050 Arthropathic psoriasis, unspecified: Secondary | ICD-10-CM | POA: Diagnosis not present

## 2023-04-25 DIAGNOSIS — R5383 Other fatigue: Secondary | ICD-10-CM | POA: Diagnosis not present

## 2023-05-09 ENCOUNTER — Encounter: Payer: Self-pay | Admitting: Internal Medicine

## 2023-05-09 DIAGNOSIS — Z933 Colostomy status: Secondary | ICD-10-CM | POA: Diagnosis not present

## 2023-05-10 ENCOUNTER — Other Ambulatory Visit: Payer: Self-pay | Admitting: Surgery

## 2023-05-10 DIAGNOSIS — Z933 Colostomy status: Secondary | ICD-10-CM

## 2023-05-10 NOTE — Progress Notes (Signed)
Left shoulder x-ray shows medium to severe arthritis especially in the small joint on the top of the shoulder.  The main shoulder joint is not too bad.

## 2023-05-16 ENCOUNTER — Encounter: Payer: Self-pay | Admitting: Family Medicine

## 2023-05-16 ENCOUNTER — Ambulatory Visit (INDEPENDENT_AMBULATORY_CARE_PROVIDER_SITE_OTHER): Payer: Medicare Other | Admitting: Family Medicine

## 2023-05-16 VITALS — BP 132/70 | HR 67 | Temp 98.4°F | Ht 68.0 in | Wt 177.2 lb

## 2023-05-16 DIAGNOSIS — R202 Paresthesia of skin: Secondary | ICD-10-CM | POA: Diagnosis not present

## 2023-05-16 NOTE — Patient Instructions (Signed)
I will be setting up MRI to further assess the facial and arm numbness.

## 2023-05-16 NOTE — Progress Notes (Addendum)
Established Patient Office Visit  Subjective   Patient ID: Joshua [name], male    DOB: [birth year]  Age: 67 y.o. MRN: [id number]  Chief Complaint  Patient presents with   Shoulder Pain     Patient complains of left shoulder pain/numbness, x2 weeks   Numbness    Patient complains of facial numbness, x2 weeks     HPI   Joshua [name] is seen today with some left upper extremity paresthesias along with left face paresthesias.  He had presented to sports medicine back middle of [name] with chronic left shoulder pain.  He had steroid injection and shortly following that developed the paresthesias.  He is not having any cervical neck pain.  No headaches.  Question of some left eyelid weakness compared to the right.  No facial droop.  No speech changes.  No recent confusion.  Feels like his coordination may be also slightly left hand at times.  His symptoms did not start immediately following injection of the shoulder.  Does not have any total numbness of left upper extremity but partial.  Denies any left lower extremity weakness or numbness.  No history of CVA.  Denies any recent chest pain or palpitations.  Recent medical history significant for partial colectomy and end colostomy secondary to complicated diverticulitis with abscess back in September.  He does have psoriatic arthritis takes Remicade.  Also has type 2 diabetes.  Doing well from GI standpoint.  Recent A1c 6.8.  Past Medical History:  Diagnosis Date   Anal fistula    Hidradenitis    History of bladder cancer 2009   urologist-- [name]---  s/p TURBT 2010,  recurrence in office 2014 ,  none since per pt   History of colonic polyps    History of COVID-19 04/2019   per pt had covid pneumonia recovered at home, no oxygen, symptoms resolved   History of diverticulitis of colon    History of hypertension 04/05/2009   per the pt he lost 55 lbs and htn has been reversed   History of kidney stones    Hyperlipidemia 04/05/2009   pt  states he does not take med for cholesterol   OA (osteoarthritis) 04/05/2009   Psoriatic arthritis (HCC)    rheumonotologist--- [name]   Type 2 diabetes mellitus (HCC) 04/05/2009   followed by pcp---  (01-31-2021  per pt only checks blood sugar once weekly, last fasting sugar-- 122)   Past Surgical History:  Procedure Laterality Date   COLONOSCOPY     last one 11-04-2020  by gessner   EXTRACORPOREAL SHOCK WAVE LITHOTRIPSY  2015   INCISION AND DRAINAGE ABSCESS N/A 02/03/2021   Procedure: EXCISION OF PERIANAL AND PERINEAL HYDRADENITIS, INTERROGATION OF PERIANAL WOUNDS;  Surgeon: [name], MD;  Location: [facility];  Service: General;  Laterality: N/A;   PARTIAL COLECTOMY N/A 02/26/2023   Procedure: EXPLORATORY LAPAROTOMY; PARTIAL COLECTOMY; COLOSTOMY;  Surgeon: [name], MD;  Location: [facility];  Service: General;  Laterality: N/A;   RECTAL EXAM UNDER ANESTHESIA  02/03/2021   Procedure: ANORECTAL EXAM UNDER ANESTHESIA;  Surgeon: [name], MD;  Location:  SURGERY CENTER;  Service: General;;   TONSILLECTOMY     child   TRANSURETHRAL RESECTION OF BLADDER TUMOR  02/2008    reports that he has been smoking cigarettes and cigars. He has never used smokeless tobacco. He reports that he does not currently use alcohol. He reports that he does not use drugs. family history includes Arthritis  in his father; Crohn's disease in his daughter; Diabetes in his mother; Hypertension in his mother. Allergies  Allergen Reactions   Adalimumab Other (See Comments)   Penicillin G Other (See Comments)   Penicillins Hives    And fever blisters    Review of Systems  Constitutional:  Negative for chills, fever and weight loss.  Eyes:  Negative for double vision.  Respiratory:  Negative for shortness of breath.   Cardiovascular:  Negative for chest pain.  Neurological:  Negative for dizziness, tremors, speech change, seizures, loss of consciousness  and headaches.  Psychiatric/Behavioral:  Negative for memory loss.        Objective:     BP 132/70 (BP Location: Left Arm, Patient Position: Sitting, Cuff Size: Normal)   Pulse 67   Temp 98.4 F (36.9 C) (Oral)   Ht 5\' 8"  (1.727 m)   Wt 177 lb 3.2 oz (80.4 kg)   SpO2 99%   BMI 26.94 kg/m  BP Readings from Last 3 Encounters:  05/16/23 132/70  04/23/23 112/74  04/10/23 120/74   Wt Readings from Last 3 Encounters:  05/16/23 177 lb 3.2 oz (80.4 kg)  04/23/23 176 lb (79.8 kg)  04/10/23 174 lb 3.2 oz (79 kg)      Physical Exam Vitals reviewed.  Constitutional:      Appearance: He is not toxic-appearing or diaphoretic.  Neck:     Comments: No carotid bruits Cardiovascular:     Rate and Rhythm: Normal rate and regular rhythm.  Pulmonary:     Effort: Pulmonary effort is normal.     Breath sounds: Normal breath sounds. No wheezing or rales.  Musculoskeletal:     Cervical back: Neck supple.  Neurological:     General: No focal deficit present.     Mental Status: He is alert and oriented to person, place, and time.     Cranial Nerves: No cranial nerve deficit.     Motor: No weakness.     Coordination: Coordination normal.     Gait: Gait normal.     Deep Tendon Reflexes: Reflexes normal.     Comments: No focal strength deficits.  Cranial nerves II through XII intact.  May have just very slight weakness of the left lid compared to right but no difficulties with closing the left eyelid.  No difficulties with prolonged upward gaze.  Grip strengths are equal and symmetric bilaterally.  Gait normal.      No results found for any visits on 05/16/23.    The 10-year ASCVD risk score (Arnett DK, et al., 2019) is: 33%    Assessment & Plan:   67 year old male with history of hypertension, type 2 diabetes, psoriatic arthritis, bladder cancer who presents with 2-week history of somewhat intermittent paresthesia and possibly some mild weakness left upper extremity.  Has also had  some intermittent left facial numbness.  No evidence for facial weakness on exam.  No focal strength deficits.  We explained that we did not think his current symptoms would be explained by recent steroid injection of the shoulder.  -Given persistence of left upper extremity paresthesias, left facial numbness, question of left eyelid weakness set up MRI brain to further assess.  He has no evidence clinically to suggest Bell's palsy.  Not describing any cervical neck pain or symptoms to suggest cervical radiculitis -Follow-up immediately for any progressive numbness or weakness or any new symptoms such as confusion or headache -If above unrevealing consider neuroreferral   No follow-ups on file.  Evelena Peat, MD

## 2023-05-23 ENCOUNTER — Inpatient Hospital Stay
Admission: RE | Admit: 2023-05-23 | Discharge: 2023-05-23 | Disposition: A | Discharge status: Home / Self Care | Payer: Medicare Other | Source: Ambulatory Visit | Attending: Family Medicine | Admitting: Family Medicine

## 2023-05-23 DIAGNOSIS — R202 Paresthesia of skin: Secondary | ICD-10-CM

## 2023-05-23 DIAGNOSIS — I63511 Cerebral infarction due to unspecified occlusion or stenosis of right middle cerebral artery: Secondary | ICD-10-CM | POA: Diagnosis not present

## 2023-05-25 ENCOUNTER — Telehealth: Payer: Self-pay

## 2023-05-25 ENCOUNTER — Telehealth: Payer: Self-pay | Admitting: Family Medicine

## 2023-05-25 NOTE — Telephone Encounter (Signed)
Pleas see previous encounter

## 2023-05-25 NOTE — Telephone Encounter (Signed)
Copied from CRM [id number]. Topic: Clinical - Lab/Test Results >> May 25, 2023 12:09 PM Joanette Gula wrote: Pt. wants results

## 2023-05-25 NOTE — Telephone Encounter (Signed)
I spoke with Stacy at the reading room and she has changed imaging to STAT for overread

## 2023-05-25 NOTE — Telephone Encounter (Signed)
Copied from CRM [id number]. Topic: Clinical - Lab/Test Results >> May 25, 2023  9:02 AM Leavy Cella D wrote: Reason for CRM: Patient's [name] called stated that patient had MRI done on 12/18.Wife wanted to know the stats of MRI and stated patients symptoms are the same .

## 2023-05-28 NOTE — Addendum Note (Signed)
Addended by: Kristian Covey on: 05/28/2023 08:05 AM   Modules accepted: Orders

## 2023-05-31 ENCOUNTER — Ambulatory Visit
Admission: RE | Admit: 2023-05-31 | Discharge: 2023-05-31 | Disposition: A | Discharge status: Home / Self Care | Payer: Medicare Other | Source: Ambulatory Visit | Attending: Surgery | Admitting: Surgery

## 2023-05-31 DIAGNOSIS — Z933 Colostomy status: Secondary | ICD-10-CM

## 2023-06-07 ENCOUNTER — Encounter: Payer: Self-pay | Admitting: Neurology

## 2023-06-07 ENCOUNTER — Ambulatory Visit (INDEPENDENT_AMBULATORY_CARE_PROVIDER_SITE_OTHER): Payer: Medicare Other | Admitting: Neurology

## 2023-06-07 VITALS — BP 144/84 | HR 69 | Ht 68.0 in | Wt 180.5 lb

## 2023-06-07 DIAGNOSIS — I639 Cerebral infarction, unspecified: Secondary | ICD-10-CM | POA: Insufficient documentation

## 2023-06-07 MED ORDER — SIMVASTATIN 10 MG PO TABS: 10.0000 mg | ORAL_TABLET | Freq: Every day | ORAL | 11 refills | Status: DC

## 2023-06-07 NOTE — Patient Instructions (Signed)
 Aspirin 81mg  daily.  Increase water intake

## 2023-06-07 NOTE — Progress Notes (Signed)
 Chief Complaint  Patient presents with   Room 13    Pt is here with his Wife. Pt states his left side is tingling. Pt states that 2 fingers on his left hand are numb. Pt states that he has some confusion about things. Pt states that he is having balance issues. Pt would like to discuss his MRI and the next steps for treatment.       ASSESSMENT AND PLAN  Joshua [name] is a 68 y.o. 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   MEDICAL HISTORY:  Joshua [name] is a 68 year old right-handed male accompanied by his wife, seen in request by his primary care doctor Micheal Pin for evaluation of stroke by MRI, initial evaluation June 07, 2023    History is obtained from the patient and review of electronic medical records. I personally reviewed pertinent available imaging films in PACS.   PMHx of DM since 2016 Psoriatic arthritis  Bladder cancer,  Hx of kidney stone HLD Diverticulitis,   He presenting with sudden onset left arm weakness numbness wake up from sleep on May 09, 2023, thought it was due to his left shoulder injection 2 weeks ago, wife also noted mild left facial droop, the same day while driving, he described difficulty keep in the laying, tends to veer towards the left side, mild confusion, have hard time operating his computer and cell phone,  He was seen by primary care eventually, had MRI of the brain on May 26, 2023, showed subacute right MCA infarction with petechiae hemorrhage in the right frontal lobe, no mass effect  His left side  difficulty has regained some recovery still have left arm numbness and weakness, mild gait abnormality he contributed to his low back pain  He has vascular risk factor of diabetes, mild hyperlipidemia LDL was 105,  In September 2024, he suffered severe case of diverticulitis, required partial colectomy, colostomy, prolonged antibiotic treatment  Laboratory evaluation in November 2024 A1c 6.8, LDL 105, normal CBC, hemoglobin of 14.5, CMP creatinine 0.84,  PHYSICAL EXAM:   Vitals:   06/07/23 0817  BP: (!) 144/84  Pulse: 69  Weight: 180 lb 8 oz (81.9 kg)  Height: 5' 8 (1.727 m)   Not recorded     Body mass index is 27.44 kg/m.  PHYSICAL EXAMNIATION:  Gen: NAD, conversant, well nourised, well groomed                     Cardiovascular: Regular rate rhythm, no peripheral edema, warm, nontender. Eyes: Conjunctivae clear without exudates or hemorrhage Neck: Supple, no carotid bruits. Pulmonary: Clear to auscultation bilaterally   NEUROLOGICAL EXAM:  MENTAL STATUS: Speech/cognition: Awake, alert, oriented to history taking and casual conversation CRANIAL NERVES: CN II: Visual fields are full to confrontation. Pupils are round equal and briskly reactive to light. CN III, IV, VI: extraocular movement are normal. No ptosis. CN V: Facial sensation is intact to light touch CN VII: Mild shallow left nasolabial fold, CN VIII: Hearing is normal to causal conversation. CN IX, X: Phonation is normal. CN XI: Head turning  and shoulder shrug are intact  MOTOR: Left arm pronation drift, fixation on rapid rotating movement,  REFLEXES: Mild hyperreflexia on the left side  SENSORY: Intact to light touch, pinprick and vibratory sensation are intact in fingers and toes.  COORDINATION: There is no trunk or limb dysmetria noted.  GAIT/STANCE: Push-up to get up from seated position, leaning forward, mildly antalgic  REVIEW OF SYSTEMS:  Full 14 system review of systems performed and  notable only for as above All other review of systems were negative.   ALLERGIES: Allergies  Allergen Reactions   Adalimumab Other (See Comments)   Penicillin G Other (See Comments)   Penicillins Hives    And fever blisters    HOME MEDICATIONS: Current Outpatient Medications  Medication Sig Dispense Refill   acetaminophen  (TYLENOL ) 500 MG tablet Take 2 tablets (1,000 mg total) by mouth every 6 (six) hours as needed. 30 tablet 0   BAYER MICROLET LANCETS lancets Check Blood Sugar Once Daily. 30 each 5   clindamycin  (CLEOCIN  T) 1 % lotion Apply 1 application  topically daily.     empagliflozin  (JARDIANCE ) 10 MG TABS tablet TAKE 1 TABLET BY MOUTH DAILY 90 tablet 1   inFLIXimab -axxq (AVSOLA ) 100 MG injection 100 mg by Intravenous (Continuous Infusion) route See admin instructions. Infusion every 8 weeks     ONETOUCH ULTRA test strip USE AS DIRECTED TO CHECK BLOOD GLUCOSE ONE TIME DAILY 100 strip 10   polyethylene glycol (MIRALAX  / GLYCOLAX ) 17 g packet Take 17 g by mouth daily as needed for mild constipation. 14 each 0   dicyclomine  (BENTYL ) 20 MG tablet Take 1 tablet (20 mg total) by mouth every 6 (six) hours as needed for spasms. (Patient not taking: Reported on 06/07/2023) 30 tablet 1   methocarbamol  1000 MG TABS Take 1,000 mg by mouth every 6 (six) hours as needed for muscle spasms. (Patient not taking: Reported on 06/07/2023) 40 tablet 0   simethicone  (MYLICON) 80 MG chewable tablet Chew 0.5 tablets (40 mg total) by mouth every 6 (six) hours as needed for flatulence (bloating). (Patient not taking: Reported on 06/07/2023) 30 tablet 0   No current facility-administered medications for this visit.    PAST MEDICAL HISTORY: Past Medical History:  Diagnosis Date   Anal fistula    Hidradenitis    History of bladder cancer 2009   urologist-- dr rosalind---  s/p TURBT 2010,  recurrence in office 2014 ,  none since per pt   History of colonic polyps    History of COVID-19 04/2019   per pt had  covid pneumonia recovered at home, no oxygen, symptoms resolved   History of diverticulitis of colon    History of hypertension 04/05/2009   per the pt he lost 55 lbs and htn has been reversed   History of kidney stones    Hyperlipidemia 04/05/2009   pt states he does not take med for cholesterol   OA (osteoarthritis) 04/05/2009   Psoriatic arthritis (HCC)    rheumonotologist--- dr aSABRA jacob   Type 2 diabetes mellitus (HCC) 04/05/2009   followed by pcp---  (01-31-2021  per pt only checks blood sugar once weekly, last fasting sugar-- 122)    PAST SURGICAL HISTORY: Past Surgical History:  Procedure Laterality Date   COLONOSCOPY     last one 11-04-2020  by gessner   EXTRACORPOREAL SHOCK WAVE LITHOTRIPSY  2015   INCISION AND DRAINAGE ABSCESS N/A 02/03/2021   Procedure: EXCISION OF PERIANAL AND PERINEAL HYDRADENITIS, INTERROGATION OF PERIANAL WOUNDS;  Surgeon: Teresa Lonni HERO, MD;  Location: [facility];  Service: General;  Laterality: N/A;   PARTIAL COLECTOMY N/A 02/26/2023   Procedure: EXPLORATORY LAPAROTOMY; PARTIAL COLECTOMY; COLOSTOMY;  Surgeon: Vernetta Berg, MD;  Location: [facility];  Service: General;  Laterality: N/A;   RECTAL EXAM UNDER ANESTHESIA  02/03/2021   Procedure: ANORECTAL EXAM UNDER ANESTHESIA;  Surgeon: Teresa Lonni HERO, MD;  Location: [facility];  Service: General;;   TONSILLECTOMY     child   TRANSURETHRAL RESECTION OF BLADDER TUMOR  02/2008    FAMILY HISTORY: Family History  Problem Relation Age of Onset   Diabetes Mother    Hypertension Mother    Arthritis Father    Crohn's disease Daughter    Esophageal cancer Neg Hx    Colon cancer Neg Hx    Pancreatic cancer Neg Hx    Stomach cancer Neg Hx     SOCIAL HISTORY: Social History   Socioeconomic History   Marital status: Married    Spouse name: Not on file   Number of children: Not on file   Years of education: Not on file   Highest education level: Bachelor's  degree (e.g., BA, AB, BS)  Occupational History   Not on file  Tobacco Use   Smoking status: Some Days    Types: Cigarettes, Cigars   Smokeless tobacco: Never   Tobacco comments:    01-31-2021  per pt quit cigarettes 1981 (age 7) for 8 yrs;  since then has smoked cigar's socially  Vaping Use   Vaping status: Never Used  Substance and Sexual Activity   Alcohol use: Not Currently    Comment: occasional   Drug use: No   Sexual activity: Not on file  Other Topics Concern   Not on file  Social History Narrative   Patient is married, a daughter has Crohn's disease   No alcohol or drug use he is an intermittent smoker of cigarettes and cigars   Social Drivers of Corporate Investment Banker Strain: Low Risk  (04/07/2023)   Overall Financial Resource Strain (CARDIA)    Difficulty of Paying Living Expenses: Not hard at all  Food Insecurity: No Food Insecurity (04/07/2023)   Hunger Vital Sign    Worried About Running Out of Food in the Last Year: Never true    Ran Out of Food in the Last Year: Never true  Transportation Needs: No Transportation Needs (04/07/2023)   PRAPARE - Administrator, Civil Service (Medical): No    Lack of Transportation (Non-Medical): No  Physical Activity: Insufficiently Active (04/07/2023)   Exercise Vital Sign    Days of Exercise per Week: 3 days    Minutes of Exercise per Session: 30 min  Stress: No Stress Concern Present (04/07/2023)   [employer] of Occupational Health - Occupational Stress Questionnaire    Feeling of Stress : Not at all  Social Connections: Socially Integrated (04/07/2023)   Social Connection and Isolation Panel [NHANES]    Frequency of Communication with Friends and Family: Twice a week    Frequency of Social Gatherings with Friends and Family: Once a week    Attends Religious Services: More than 4 times per year    Active Member of [employer] or Organizations: Yes    Attends Banker Meetings: More than 4 times  per year    Marital Status: Married  Catering Manager Violence: Not At Risk (02/20/2023)   Humiliation, Afraid, Rape, and Kick questionnaire  Fear of Current or Ex-Partner: No    Emotionally Abused: No    Physically Abused: No    Sexually Abused: No      Modena Callander, M.D. Ph.D.  [facility] [address] [place], [place] [address] Ph: [phone] Fax: [phone]  CC:  Micheal Wolm ORN, MD [address] [place],  [place] [address]  Micheal Wolm ORN, MD

## 2023-06-11 ENCOUNTER — Encounter: Payer: Self-pay | Admitting: Neurology

## 2023-06-11 DIAGNOSIS — I639 Cerebral infarction, unspecified: Secondary | ICD-10-CM

## 2023-06-11 NOTE — Therapy (Signed)
 OUTPATIENT PHYSICAL THERAPY NEURO EVALUATION   Patient Name: Joshua [name] MRN: [id number] DOB:08/22/1955, 68 y.o., male Today's Date: 06/12/2023   PCP: Micheal Wolm ORN, MD  REFERRING PROVIDER: Onita Duos, MD   END OF SESSION:  PT End of Session - 06/12/23 1145     Visit Number 1    Number of Visits 7    Date for PT Re-Evaluation 07/24/23    Authorization Type BCBS Medicare    PT Start Time 1105    PT Stop Time 1144    PT Time Calculation (min) 39 min    Equipment Utilized During Treatment Gait belt    Activity Tolerance Patient tolerated treatment well    Behavior During Therapy WFL for tasks assessed/performed             Past Medical History:  Diagnosis Date   Anal fistula    Hidradenitis    History of bladder cancer 2009   urologist-- dr rosalind---  s/p TURBT 2010,  recurrence in office 2014 ,  none since per pt   History of colonic polyps    History of COVID-19 04/2019   per pt had covid pneumonia recovered at home, no oxygen, symptoms resolved   History of diverticulitis of colon    History of hypertension 04/05/2009   per the pt he lost 55 lbs and htn has been reversed   History of kidney stones    Hyperlipidemia 04/05/2009   pt states he does not take med for cholesterol   OA (osteoarthritis) 04/05/2009   Psoriatic arthritis (HCC)    rheumonotologist--- dr a. ishmael   Type 2 diabetes mellitus (HCC) 04/05/2009   followed by pcp---  (01-31-2021  per pt only checks blood sugar once weekly, last fasting sugar-- 122)   Past Surgical History:  Procedure Laterality Date   COLONOSCOPY     last one 11-04-2020  by gessner   EXTRACORPOREAL SHOCK WAVE LITHOTRIPSY  2015   INCISION AND DRAINAGE ABSCESS N/A 02/03/2021   Procedure: EXCISION OF PERIANAL AND PERINEAL HYDRADENITIS, INTERROGATION OF PERIANAL WOUNDS;  Surgeon: Teresa Lonni HERO, MD;  Location: [facility];  Service: General;  Laterality: N/A;   PARTIAL COLECTOMY N/A 02/26/2023    Procedure: EXPLORATORY LAPAROTOMY; PARTIAL COLECTOMY; COLOSTOMY;  Surgeon: Vernetta Berg, MD;  Location: [facility];  Service: General;  Laterality: N/A;   RECTAL EXAM UNDER ANESTHESIA  02/03/2021   Procedure: ANORECTAL EXAM UNDER ANESTHESIA;  Surgeon: Teresa Lonni HERO, MD;  Location: [facility];  Service: General;;   TONSILLECTOMY     child   TRANSURETHRAL RESECTION OF BLADDER TUMOR  02/2008   Patient Active Problem List   Diagnosis Date Noted   Cerebrovascular accident (CVA) (HCC) 06/07/2023   Protein-calorie malnutrition, severe 02/26/2023   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   Sigmoid diverticulitis 02/08/2023   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   Rectal bleeding 03/31/2020   Psoriatic arthritis (HCC) 02/21/2016   History of colonic polyp - sessile serrated polyp 03/12/2014   SHOULDER PAIN 01/05/2010   Malignant neoplasm of bladder (HCC) 04/05/2009   Type 2 diabetes mellitus (HCC) 04/05/2009   Hyperlipidemia 04/05/2009   Essential hypertension 04/05/2009   Osteoarthritis 04/05/2009    ONSET DATE: 05/23/23  REFERRING DIAG: I63.9 (ICD-10-CM) - Cerebrovascular accident (CVA), unspecified mechanism (HCC)  THERAPY DIAG:  Muscle weakness (generalized)  Unsteadiness on feet  Other abnormalities of gait and mobility  Dizziness and giddiness  Rationale for Evaluation and Treatment: Rehabilitation  SUBJECTIVE:  SUBJECTIVE STATEMENT: Patient reports changes in coordination, strength in L UE and LE, balance since his stroke. Reports some N/T in the L LE. Was going to the gym prior to CVA and was given the go-ahead to return by his MD. Reports occasional dizziness and diplopia. Reports when trying to drive he veers to the L. Dizziness sometimes occurs after prolonged sitting but  is not on HTN medication. Denies HA's (occasional sinus pain).  Pt accompanied by: self  PERTINENT HISTORY: Bladder CA s/p surgery 2009, HTN, HLD, psoriatic arthritis, DMII, partial colectomy 2024  PAIN:  Are you having pain? No  PRECAUTIONS: Other: colostomy bag  RED FLAGS: None   WEIGHT BEARING RESTRICTIONS: No  FALLS: Has patient fallen in last 6 months? 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  DIAGNOSTIC FINDINGS: 05/23/23 brain MRI: Subacute right MCA territory infarcts with petechial hemorrhage in the right frontal lobe. No mass effect or midline shift.  COGNITION: Overall cognitive status: Within functional limits for tasks assessed   SENSATION: Diminished L lateral hand, L arm, L HS numbness; intact to light touch    COORDINATION: Alternating pronation/supination: slight bradykinesia Alternating toe tap: intact B Finger to nose: slight bradykinesia and dysmetria L Heel to shin: intact B   MUSCLE TONE: WNL B   POSTURE: rounded shoulders, FHP, slight trunk lean forward  LOWER EXTREMITY ROM:     Active  Right Eval Left Eval  Hip flexion    Hip extension    Hip abduction    Hip adduction    Hip internal rotation    Hip external rotation    Knee flexion    Knee extension    Ankle dorsiflexion 10 8  Ankle plantarflexion    Ankle inversion    Ankle eversion     (Blank rows = not tested)  LOWER EXTREMITY MMT:    MMT (in sitting) Right Eval Left Eval  Hip flexion 4+ 4-  Hip extension    Hip abduction 4 4  Hip adduction 4 4-  Hip internal rotation    Hip external rotation    Knee flexion 4 4   Knee extension 5 4+  Ankle dorsiflexion 5 5  Ankle plantarflexion 4+ 4+  Ankle inversion    Ankle eversion    (Blank rows = not tested)   GAIT: Gait pattern: reduced step length, flexed trunk, slowed Assistive device utilized: None Level of assistance: Modified independence   FUNCTIONAL TESTS:   [facility] PT Assessment - 06/12/23 0001       Functional Gait  Assessment   Gait assessed  Yes    Gait Level Surface Walks 20 ft in less than 7 sec but greater than 5.5 sec, uses assistive device, slower speed, mild gait deviations, or deviates 6-10 in outside of the 12 in walkway width.    Change in Gait Speed Able to smoothly change walking speed without loss of balance or gait deviation. Deviate no more than 6 in outside of the 12 in walkway width.    Gait with Horizontal Head Turns Performs head turns with moderate changes in gait velocity, slows down, deviates 10-15 in outside 12 in walkway width but  recovers, can continue to walk.    Gait with Vertical Head Turns Performs head turns with no change in gait. Deviates no more than 6 in outside 12 in walkway width.    Gait and Pivot Turn Turns slowly, requires verbal cueing, or requires several small steps to catch balance following turn and stop    Step Over Obstacle Is able to step over 2 stacked shoe boxes taped together (9 in total height) without changing gait speed. No evidence of imbalance.    Gait with Narrow Base of Support Is able to ambulate for 10 steps heel to toe with no staggering.    Gait with Eyes Closed Walks 20 ft, slow speed, abnormal gait pattern, evidence for imbalance, deviates 10-15 in outside 12 in walkway width. Requires more than 9 sec to ambulate 20 ft.    Ambulating Backwards Walks 20 ft, no assistive devices, good speed, no evidence for imbalance, normal gait    Steps Alternating feet, no rail.    Total Score 23                M-CTSIB  Condition 1: Firm Surface, EO 30 Sec, Normal Sway  Condition 2: Firm  Surface, EC 30 Sec, Normal and Mild Sway  Condition 3: Foam Surface, EO 30 Sec, Mild Sway  Condition 4: Foam Surface, EC 20 Sec, Moderate and Severe Sway     FOTO: [phone],                                                                                                                               TREATMENT DATE: 06/12/23    PATIENT EDUCATION: Education details: prognosis, POC, HEP Person educated: Patient Education method: Explanation, Demonstration, Tactile cues, Verbal cues, and Handouts Education comprehension: verbalized understanding and returned demonstration  HOME EXERCISE PROGRAM: Access Code: [email or web address] URL: [email or web address] Date: 06/12/2023 Prepared by: [facility] - Outpatient  Rehab - [facility]  Exercises - Sidelying Hip Abduction  - 1 x daily - 3 x weekly - 2 sets - 10 reps - Tandem Walking with Counter Support  - 1 x daily - 3 x weekly - 2 sets - 5 reps - Hamstring Curl with Weight Machine  - 1 x daily - 3 x weekly - 2 sets - 10 reps - Hip Adduction Machine  - 1 x daily - 3 x weekly - 2 sets - 10 reps - Hip Abduction Machine  - 1 x daily - 3 x weekly - 2 sets - 10 reps  GOALS: Goals reviewed with patient? Yes  SHORT TERM GOALS: Target date: 07/03/2023  Patient to be independent with initial HEP. Baseline: HEP initiated Goal status: INITIAL    LONG TERM GOALS: Target date: 07/24/2023  Patient to be independent with advanced HEP. Baseline: Not yet initiated  Goal status: INITIAL  Patient to demonstrate B LE strength >/=4+/5.  Baseline: See above Goal status: INITIAL  Patient to demonstrate B ankle dorsiflexion AROM  at least 12 deg to improve gait efficiency.  Baseline: 10, 8 deg Goal status: INITIAL  Patient to report return to gym regimen with modifications as needed.  Baseline: not yet returned Goal status: INITIAL  Patient to demonstrate at least 25/30 on FGA in order to decrease risk of falls.  Baseline: 23 Goal status:  INITIAL  Patient to score at least 84 on FOTO in order to indicate improved functional outcomes.  Baseline: 54 Goal status: INITIAL  ASSESSMENT:  CLINICAL IMPRESSION:  Patient is a 68 y/o M presenting to [facility] with c/o L LE weakness, imbalance, and dizziness s/p R subacute MCA seen on imaging on 05/23/23. Patient today presenting with reduced L ankle dorsiflexion AROM, decreased L>R LE weakness, gait and postural deviations, imbalance. Patient was educated on gentle strengthening and balance HEP and reported understanding. Prior to current episode, patient was independent. Would benefit from skilled PT services 1 x/week for 6 weeks to address aforementioned impairments in order to optimize level of function.    OBJECTIVE IMPAIRMENTS: Abnormal gait, decreased activity tolerance, decreased balance, decreased coordination, difficulty walking, decreased ROM, decreased strength, dizziness, impaired flexibility, impaired sensation, and postural dysfunction.   ACTIVITY LIMITATIONS: carrying, lifting, bending, standing, squatting, stairs, transfers, bathing, toileting, dressing, reach over head, hygiene/grooming, and locomotion level  PARTICIPATION LIMITATIONS: meal prep, cleaning, laundry, driving, shopping, community activity, yard work, and church  PERSONAL FACTORS: Age, Fitness, Past/current experiences, Time since onset of injury/illness/exacerbation, and 3+ comorbidities: Bladder CA s/p surgery 2009, HTN, HLD, psoriatic arthritis, DMII, partial colectomy 2024  are also affecting patient's functional outcome.   REHAB POTENTIAL: Good  CLINICAL DECISION MAKING: Evolving/moderate complexity  EVALUATION COMPLEXITY: Moderate  PLAN:  PT FREQUENCY: 1x/week  PT DURATION: 6 weeks  PLANNED INTERVENTIONS: 97164- PT Re-evaluation, 97110-Therapeutic exercises, 97530- Therapeutic activity, 97112- Neuromuscular re-education, 97535- Self Care, [id number]- Manual therapy, [phone]- Gait training, [phone]- Canalith  repositioning, [id number]- Aquatic Therapy, 97014- Electrical stimulation (unattended), [phone]- Electrical stimulation (manual), Patient/Family education, Balance training, Stair training, Taping, Dry Needling, Joint mobilization, Spinal mobilization, Vestibular training, Cryotherapy, and Moist heat  PLAN FOR NEXT SESSION: further question about dizziness/diplopia- vestibular assessment, hip strengthening, balance with EC and head turns       Louana Terrilyn Christians, [place], DPT 06/12/23 11:57 AM  [facility] [address] [place], [place] [address] Phone # [phone] Fax # [phone]

## 2023-06-12 ENCOUNTER — Ambulatory Visit: Payer: Medicare Other | Admitting: Physical Therapy

## 2023-06-12 ENCOUNTER — Ambulatory Visit: Payer: Medicare Other | Attending: Neurology | Admitting: Occupational Therapy

## 2023-06-12 ENCOUNTER — Other Ambulatory Visit: Payer: Self-pay | Admitting: Family Medicine

## 2023-06-12 ENCOUNTER — Other Ambulatory Visit: Payer: Self-pay

## 2023-06-12 DIAGNOSIS — I69354 Hemiplegia and hemiparesis following cerebral infarction affecting left non-dominant side: Secondary | ICD-10-CM | POA: Insufficient documentation

## 2023-06-12 DIAGNOSIS — M6281 Muscle weakness (generalized): Secondary | ICD-10-CM | POA: Insufficient documentation

## 2023-06-12 DIAGNOSIS — R278 Other lack of coordination: Secondary | ICD-10-CM | POA: Diagnosis not present

## 2023-06-12 DIAGNOSIS — R42 Dizziness and giddiness: Secondary | ICD-10-CM

## 2023-06-12 DIAGNOSIS — R2681 Unsteadiness on feet: Secondary | ICD-10-CM | POA: Diagnosis not present

## 2023-06-12 DIAGNOSIS — I639 Cerebral infarction, unspecified: Secondary | ICD-10-CM | POA: Diagnosis not present

## 2023-06-12 DIAGNOSIS — R2689 Other abnormalities of gait and mobility: Secondary | ICD-10-CM

## 2023-06-12 DIAGNOSIS — R208 Other disturbances of skin sensation: Secondary | ICD-10-CM | POA: Insufficient documentation

## 2023-06-12 MED ORDER — ROSUVASTATIN CALCIUM 5 MG PO TABS: 5.0000 mg | ORAL_TABLET | Freq: Every day | ORAL | 11 refills | Status: DC

## 2023-06-12 NOTE — Therapy (Signed)
 OUTPATIENT OCCUPATIONAL THERAPY NEURO EVALUATION  Patient Name: Joshua [name] MRN: [id number] DOB:01-14-1956, 68 y.o., male Today's Date: 06/12/2023  PCP: Micheal Wolm ORN, MD REFERRING PROVIDER: Onita Duos, MD  END OF SESSION:  OT End of Session - 06/12/23 1105     Visit Number 1    Number of Visits 13    Date for OT Re-Evaluation 07/27/23    Authorization Type BCBS Medicare    OT Start Time 1020    OT Stop Time 1103    OT Time Calculation (min) 43 min             Past Medical History:  Diagnosis Date   Anal fistula    Hidradenitis    History of bladder cancer 2009   urologist-- dr rosalind---  s/p TURBT 2010,  recurrence in office 2014 ,  none since per pt   History of colonic polyps    History of COVID-19 04/2019   per pt had covid pneumonia recovered at home, no oxygen, symptoms resolved   History of diverticulitis of colon    History of hypertension 04/05/2009   per the pt he lost 55 lbs and htn has been reversed   History of kidney stones    Hyperlipidemia 04/05/2009   pt states he does not take med for cholesterol   OA (osteoarthritis) 04/05/2009   Psoriatic arthritis (HCC)    rheumonotologist--- dr a. ishmael   Type 2 diabetes mellitus (HCC) 04/05/2009   followed by pcp---  (01-31-2021  per pt only checks blood sugar once weekly, last fasting sugar-- 122)   Past Surgical History:  Procedure Laterality Date   COLONOSCOPY     last one 11-04-2020  by gessner   EXTRACORPOREAL SHOCK WAVE LITHOTRIPSY  2015   INCISION AND DRAINAGE ABSCESS N/A 02/03/2021   Procedure: EXCISION OF PERIANAL AND PERINEAL HYDRADENITIS, INTERROGATION OF PERIANAL WOUNDS;  Surgeon: Teresa Lonni HERO, MD;  Location: [facility];  Service: General;  Laterality: N/A;   PARTIAL COLECTOMY N/A 02/26/2023   Procedure: EXPLORATORY LAPAROTOMY; PARTIAL COLECTOMY; COLOSTOMY;  Surgeon: Vernetta Berg, MD;  Location: [facility];  Service: General;  Laterality: N/A;   RECTAL EXAM  UNDER ANESTHESIA  02/03/2021   Procedure: ANORECTAL EXAM UNDER ANESTHESIA;  Surgeon: Teresa Lonni HERO, MD;  Location: [facility];  Service: General;;   TONSILLECTOMY     child   TRANSURETHRAL RESECTION OF BLADDER TUMOR  02/2008   Patient Active Problem List   Diagnosis Date Noted   Cerebrovascular accident (CVA) (HCC) 06/07/2023   Protein-calorie malnutrition, severe 02/26/2023   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   Sigmoid diverticulitis 02/08/2023   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   Rectal bleeding 03/31/2020   Psoriatic arthritis (HCC) 02/21/2016   History of colonic polyp - sessile serrated polyp 03/12/2014   SHOULDER PAIN 01/05/2010   Malignant neoplasm of bladder (HCC) 04/05/2009   Type 2 diabetes mellitus (HCC) 04/05/2009   Hyperlipidemia 04/05/2009   Essential hypertension 04/05/2009   Osteoarthritis 04/05/2009    ONSET DATE: R MCA stroke in Dec 2024 (referral date 06/07/23)  REFERRING DIAG: I63.9 (ICD-10-CM) - Cerebrovascular accident (CVA), unspecified mechanism  THERAPY DIAG:  Hemiplegia and hemiparesis following cerebral infarction affecting left non-dominant side (HCC)  Muscle weakness (generalized)  Other disturbances of skin sensation  Other lack of coordination  Rationale for Evaluation and Treatment: Rehabilitation  SUBJECTIVE:   SUBJECTIVE STATEMENT: Pt reports it really fooled me.  Pt reports seeing an orthopedic doctor in Nov 2024 and getting  a shot in his L shoulder and then waking up ~2 weeks later with L shoulder soreness and numbness/weakness in LUE, decreased coordination in fingers and was concerned that it was related to shoulder issue.  Pt then noticed that he was having difficulty typing.  Pt reports driving and having difficulty with sequencing when driving, veering towards L/hugging L line, and even getting lost when driving home.  Pt reports difficulty working B eyes together and noticing that he is staying towards the  Left side of the road.  Pt reports seeing PCP but not putting all of the pieces together and unaware of possibility of stroke.    Pt reports it feels like I'm not all there.  Pt reports a disorienting sensation when he is in church and when attempting to drive.   Pt accompanied by: self (significant other dropped pt off - [name])  PERTINENT HISTORY: Bladder CA s/p surgery 2009, HTN, HLD, psoriatic arthritis, DMII, partial colectomy 2024   PRECAUTIONS: Other: colostomy bag (to be removed in March)  WEIGHT BEARING RESTRICTIONS: No  PAIN:  Are you having pain? No  FALLS: Has patient fallen in last 6 months? No  LIVING ENVIRONMENT: Lives with: lives with their spouse Lives in: House/apartment Stairs: Yes: Internal: full flight of steps to 2nd floor; bedroom and bathroom on 2nd floor steps; on left going up and External: 1 step in through the garage steps;   Has following equipment at home: None; seat in the shower but more to hold items   PLOF: Independent, Independent with basic ADLs, and Leisure: loves golfing  PATIENT GOALS: I want to feel back to normal  OBJECTIVE:  Note: Objective measures were completed at Evaluation unless otherwise noted.  HAND DOMINANCE: Right  ADLs: Transfers/ambulation related to ADLs: Little bit of challenge with balance, wife reports he walks different Eating: increased focus/effort when cutting foods Grooming: increased difficulty with coordination/strength when using L hand when shaving UB Dressing: increased difficulty with buttoning shirt LB Dressing: Mod I Toileting: Mod I Bathing: Mod I Tub Shower transfers: Supervision when bathing, walk-in shower Equipment: Walk in shower  IADLs:  Light housekeeping: wife completing majority, he will wash dishes occasionally and start the washing machine Meal Prep: helps spouse with aspects of meal prep, he will also make breakfast  Community mobility: wife does not want pt driving at this time,  but no physician has told him not to drive Medication management: Mod I, utilizing pill box which he fills independently Financial management: shared with wife  MOBILITY STATUS:  reports that spouse states he walks differently now  POSTURE COMMENTS:  rounded shoulders, forward head, and slight forward trunk lean  ACTIVITY TOLERANCE: Activity tolerance: WFL for tasks assessed on eval  FUNCTIONAL OUTCOME MEASURES: FOTO: 67, predicted 75 in 16 visits  UPPER EXTREMITY ROM:  L shoulder droop compared to R  Active ROM Right eval Left eval  Shoulder flexion 128 113  Shoulder abduction    Shoulder adduction    Shoulder extension    Shoulder internal rotation    Shoulder external rotation    Elbow flexion    Elbow extension    Wrist flexion    Wrist extension    Wrist ulnar deviation    Wrist radial deviation    Wrist pronation    Wrist supination    (Blank rows = not tested)  UPPER EXTREMITY MMT:     MMT Right eval Left eval  Shoulder flexion 4+ 4-  Shoulder abduction  Shoulder adduction    Shoulder extension    Shoulder internal rotation    Shoulder external rotation    Middle trapezius    Lower trapezius    Elbow flexion    Elbow extension    Wrist flexion    Wrist extension    Wrist ulnar deviation    Wrist radial deviation    Wrist pronation    Wrist supination    (Blank rows = not tested)  HAND FUNCTION: Grip strength: Right: 82 lbs; Left: 65 lbs  COORDINATION: 9 Hole Peg test: Right: 28.72 sec; Left: 34.03 sec Box and Blocks:  Right 58 blocks, Left 47blocks (more gross grasp with LUE)  SENSATION: Light touch: Impaired ; reports numbness in thumb, index, and long finger on L hand  MUSCLE TONE: WNL bilaterally  COGNITION: Overall cognitive status: No family/caregiver present to determine baseline cognitive functioning; reports some difficulty with comprehension and attention with reading.  VISION: Subjective report: sometimes I have to  refocus and notices intermittent dizziness Baseline vision: Wears glasses for reading only  VISION ASSESSMENT: To be further assessed in functional context  Patient has difficulty with following activities due to following visual impairments: driving, reading, reports some difficulty with comprehension and attention with reading.  PRAXIS: Impaired: Motor planning  OBSERVATIONS: OT questions cognition and awareness of deficits.  Pt reports difficulty with driving and that his spouse currently does not want him driving.                                                                                                                             TREATMENT DATE:  06/12/23 NA, eval only         PATIENT EDUCATION: Education details: Educated on role and purpose of OT as well as potential interventions and goals for therapy based on initial evaluation findings. Person educated: Patient Education method: Explanation Education comprehension: verbalized understanding and needs further education  HOME EXERCISE PROGRAM: TBD   GOALS: Goals reviewed with patient? Yes  SHORT TERM GOALS: Target date: 07/06/23  Pt will be independent with coordination HEP. Baseline: Goal status: INITIAL  2.  Pt will be independent with L shoulder/UE strengthening HEP. Baseline:  Goal status: INITIAL  3.  Pt will verbalize understanding of strategies to compensate for impaired sensation.  Baseline:  Goal status: INITIAL  4.  Pt will demonstrate improved UE functional use for ADLs as evidenced by increasing box/ blocks score by 4 blocks with LUE. Baseline: Right 58 blocks, Left 47blocks  Goal status: INITIAL   LONG TERM GOALS: Target date: 07/27/23  Pt will demonstrate ability to retrieve a moderate weight object at 125* shoulder flexion to demonstrated increased postural control, shoulder flexion, and UE strength as needed to complete ADLs and IADLs. Baseline: 113* Goal status: INITIAL  2.  Pt  will demonstrate improved fine motor coordination for ADLs as evidenced by decreasing 9 hole peg test score for LUE by 4 secs. Baseline: Right: 28.72  sec; Left: 34.03 sec Goal status: INITIAL  3.  Pt will improved L hand grip strength by 6# to demonstrate increased sustained grasp as needed for ADLs and IADLs  Baseline: Right: 82 lbs; Left: 65 lbs Goal status: INITIAL  4.  Pt will demonstrate ability to sequence simple functional task (simple snack prep, laundry task, etc) at Mod I level with good safety awareness Baseline:  Goal status: INITIAL  5.  Pt will navigate a moderately busy environment, visually attending to surroundings and obstacles, while following multi-step commands with 90% accuracy. Baseline:  Goal status: INITIAL  6.  Pt will verbalize understanding of safe return to driving recommendations Baseline:  Goal status: INITIAL  ASSESSMENT:  CLINICAL IMPRESSION: Patient is a 68 y.o. male who was seen today for occupational therapy evaluation for Right MCA stroke in December 2024. Pt demonstrating decreased strength and ROM on L, as well as decreased sensation in L thumb, index, and long fingers.  Pt with decreased coordination and reports some confusion about things and feeling that he requires increased time, effort, and focus with routine tasks as reading. Pt currently lives with spouse in a 2 story home with bedroom/bathroom on 2nd floor and is retired. Pt will benefit from skilled occupational therapy services to address strength and coordination, ROM, pain management, altered sensation, balance, GM/FM control, cognition, safety awareness, introduction of compensatory strategies/AE prn, visual-perception, and implementation of an HEP to improve participation and safety during ADLs and IADLs.   PERFORMANCE DEFICITS: in functional skills including ADLs, IADLs, coordination, sensation, ROM, strength, pain, Fine motor control, Gross motor control, balance, body mechanics,  endurance, decreased knowledge of precautions, decreased knowledge of use of DME, vision, and UE functional use, cognitive skills including safety awareness, sequencing, and understand, and psychosocial skills including coping strategies, environmental adaptation, and routines and behaviors.   IMPAIRMENTS: are limiting patient from ADLs, IADLs, and leisure.   CO-MORBIDITIES: may have co-morbidities  that affects occupational performance. Patient will benefit from skilled OT to address above impairments and improve overall function.  MODIFICATION OR ASSISTANCE TO COMPLETE EVALUATION: Min-Moderate modification of tasks or assist with assess necessary to complete an evaluation.  OT OCCUPATIONAL PROFILE AND HISTORY: Detailed assessment: Review of records and additional review of physical, cognitive, psychosocial history related to current functional performance.  CLINICAL DECISION MAKING: Moderate - several treatment options, min-mod task modification necessary  REHAB POTENTIAL: Good  EVALUATION COMPLEXITY: Moderate    PLAN:  OT FREQUENCY: 2x/week  OT DURATION: 6 weeks  PLANNED INTERVENTIONS: 97168 OT Re-evaluation, 97535 self care/ADL training, 02889 therapeutic exercise, 97530 therapeutic activity, 97112 neuromuscular re-education, 97140 manual therapy, 97035 ultrasound, 97018 paraffin, 02989 moist heat, 97010 cryotherapy, 97032 electrical stimulation (manual), passive range of motion, balance training, functional mobility training, compression bandaging, visual/perceptual remediation/compensation, psychosocial skills training, energy conservation, coping strategies training, patient/family education, and DME and/or AE instructions  RECOMMENDED OTHER SERVICES: possible SLP  CONSULTED AND AGREED WITH PLAN OF CARE: Patient  PLAN FOR NEXT SESSION: further assess ROM and strength, initiate coordination and UE strengthening HEP, further assess cognition and safety awareness   KAYLENE DOMINO,  OTR/L 06/12/2023, 1:49 PM   [facility] [address], [address] [place], [place] [address] Phone # [phone] Fax # [phone]

## 2023-06-18 ENCOUNTER — Telehealth: Payer: Self-pay

## 2023-06-18 ENCOUNTER — Ambulatory Visit (AMBULATORY_SURGERY_CENTER): Payer: Medicare Other

## 2023-06-18 VITALS — Ht 68.0 in | Wt 177.0 lb

## 2023-06-18 DIAGNOSIS — Z860100 Personal history of colon polyps, unspecified: Secondary | ICD-10-CM

## 2023-06-18 NOTE — Progress Notes (Signed)
 No egg or soy allergy known to patient  No issues known to pt with past sedation with any surgeries or procedures Patient denies ever being told they had issues or difficulty with intubation  No FH of Malignant Hyperthermia Pt is not on diet pills Pt is not on  home 02  Pt is not on blood thinners  Pt denies issues with constipation. Taking miralax  OTC daily. No A fib or A flutter Have any cardiac testing pending-- Echo and carotid 06/29/23 for stroke on 05/09/23 LOA: independent  Prep: miralax    Patient's chart reviewed by Norleen Schillings CNRA prior to previsit and patient appropriate for the [facility].  Previsit completed and red dot placed by patient's name on their procedure day (on provider's schedule).     PV competed with patient. Prep instructions sent via mychart and home address.

## 2023-06-18 NOTE — Telephone Encounter (Signed)
 Procedure has been cancelled patient made aware of recommendations

## 2023-06-18 NOTE — Telephone Encounter (Addendum)
 Dr. Avram,   Pt made me aware he had a minor stroke on 12/4. He has since been started on Crestor . Only thing he reports in a slight lost in motor skills in his hands and balance but is able to walk and do daily activities independently. He has a echo  and carotid scheduled on 06/29/23 Pt has a colostomy and is wanting to proceed. Colonoscopy is scheduled for 07/06/23 Please advise if you would be ok with this or what next steps you would prefer?   Thank you, PV

## 2023-06-18 NOTE — Telephone Encounter (Signed)
 He cannot have the procedure in the [facility] until 3 months have passed since stroke.  I will see if it is possible to do it at the hospital but he should know this could also affect timing of planned surgery  Cancel [facility] procedure  Let him know I will ask neurology and surgery about tgis and we will get back

## 2023-06-18 NOTE — Therapy (Signed)
 OUTPATIENT PHYSICAL THERAPY NEURO TREATMENT   Patient Name: Joshua [name] MRN: [id number] DOB:[birth year], 68 y.o., male Today's Date: 06/19/2023   PCP: Micheal Wolm ORN, MD  REFERRING PROVIDER: Onita Duos, MD   END OF SESSION:  PT End of Session - 06/19/23 1357     Visit Number 2    Number of Visits 7    Date for PT Re-Evaluation 07/24/23    Authorization Type BCBS Medicare    PT Start Time 1314    PT Stop Time 1357    PT Time Calculation (min) 43 min    Activity Tolerance Patient tolerated treatment well    Behavior During Therapy WFL for tasks assessed/performed              Past Medical History:  Diagnosis Date   Anal fistula    Hidradenitis    History of bladder cancer 2009   urologist-- dr rosalind---  s/p TURBT 2010,  recurrence in office 2014 ,  none since per pt   History of colonic polyps    History of COVID-19 04/2019   per pt had covid pneumonia recovered at home, no oxygen, symptoms resolved   History of diverticulitis of colon    History of hypertension 04/05/2009   per the pt he lost 55 lbs and htn has been reversed   History of kidney stones    Hyperlipidemia 04/05/2009   pt states he does not take med for cholesterol   OA (osteoarthritis) 04/05/2009   Psoriatic arthritis (HCC)    rheumonotologist--- dr a. ishmael   Type 2 diabetes mellitus (HCC) 04/05/2009   followed by pcp---  (01-31-2021  per pt only checks blood sugar once weekly, last fasting sugar-- 122)   Past Surgical History:  Procedure Laterality Date   COLONOSCOPY     last one 11-04-2020  by gessner   EXTRACORPOREAL SHOCK WAVE LITHOTRIPSY  2015   INCISION AND DRAINAGE ABSCESS N/A 02/03/2021   Procedure: EXCISION OF PERIANAL AND PERINEAL HYDRADENITIS, INTERROGATION OF PERIANAL WOUNDS;  Surgeon: Teresa Lonni HERO, MD;  Location: [facility];  Service: General;  Laterality: N/A;   PARTIAL COLECTOMY N/A 02/26/2023   Procedure: EXPLORATORY LAPAROTOMY; PARTIAL  COLECTOMY; COLOSTOMY;  Surgeon: Vernetta Berg, MD;  Location: [facility];  Service: General;  Laterality: N/A;   RECTAL EXAM UNDER ANESTHESIA  02/03/2021   Procedure: ANORECTAL EXAM UNDER ANESTHESIA;  Surgeon: Teresa Lonni HERO, MD;  Location: [facility];  Service: General;;   TONSILLECTOMY     child   TRANSURETHRAL RESECTION OF BLADDER TUMOR  02/2008   Patient Active Problem List   Diagnosis Date Noted   Cerebrovascular accident (CVA) (HCC) 06/07/2023   Protein-calorie malnutrition, severe 02/26/2023   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   Sigmoid diverticulitis 02/08/2023   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   Rectal bleeding 03/31/2020   Psoriatic arthritis (HCC) 02/21/2016   History of colonic polyp - sessile serrated polyp 03/12/2014   SHOULDER PAIN 01/05/2010   Malignant neoplasm of bladder (HCC) 04/05/2009   Type 2 diabetes mellitus (HCC) 04/05/2009   Hyperlipidemia 04/05/2009   Essential hypertension 04/05/2009   Osteoarthritis 04/05/2009    ONSET DATE: 05/23/23  REFERRING DIAG: I63.9 (ICD-10-CM) - Cerebrovascular accident (CVA), unspecified mechanism (HCC)  THERAPY DIAG:  Muscle weakness (generalized)  Unsteadiness on feet  Other abnormalities of gait and mobility  Dizziness and giddiness  Rationale for Evaluation and Treatment: Rehabilitation  SUBJECTIVE:  SUBJECTIVE STATEMENT: Doing a lot better. Started on the treadmill - walking and running. Has also been using resistant bands and dumbbells. Has not made it to the gym yet. Reports that he gets overwhelmed when outside or at church. Denies dizziness in the past week. Reports more trouble reading up close recently. Denies dizziness with bed mobility.   Pt accompanied by: self  PERTINENT HISTORY: Bladder CA  s/p surgery 2009, HTN, HLD, psoriatic arthritis, DMII, partial colectomy 2024  PAIN:  Are you having pain? No  PRECAUTIONS: Other: colostomy bag  RED FLAGS: None   WEIGHT BEARING RESTRICTIONS: No  FALLS: Has patient fallen in last 6 months? 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  PLOF:  retired; administrator, arts and exercising at [employer]  PATIENT GOALS: feel like I'm back to normal, driving, balance  OBJECTIVE:       TODAY'S TREATMENT: 06/19/23 Activity Comments  Pencil pushups 5x  Smooth pursuit vertical 5x   sidelying hip ABD 10x each  Heavy cueing for proper form and alignment- pt reported more difficulty performing with proper form   tandem walk fwd/back At counter; required cues to slow down   Standing hip ABD/ADD with red TB Edu on set up at home, chair for support   Sidestepping with red TB  Cues to avoid lateral trunk lean   Anterior monster walks with red TB  Cues to maintain soft knee bend        VESTIBULAR ASSESSMENT   GENERAL OBSERVATION: pt wears readers    OCULOMOTOR EXAM:   Ocular Alignment: normal   Ocular ROM: No Limitations   Spontaneous Nystagmus: absent   Gaze-Induced Nystagmus: absent   Smooth Pursuits:  limited inferior L eye ROM   Saccades: hypometric/undershoots and slow to L   Convergence/Divergence: L insufficiency at L ~ 4 cm  Cover/uncover: negative    VESTIBULAR - OCULAR REFLEX:    Slow VOR: Normal   VOR Cancellation: Corrective Saccades to L   Head-Impulse Test: HIT Right: negative HIT Left: negative      POSITIONAL TESTING:  NT      PATIENT EDUCATION: Education details: edu on vestibular test results and HEP update with edu for safety  Person educated: Patient Education method: Explanation, Demonstration, Tactile cues, Verbal cues, and Handouts Education comprehension: verbalized  understanding and returned demonstration    Note: Objective measures were completed at Evaluation unless otherwise noted.  DIAGNOSTIC FINDINGS: 05/23/23 brain MRI: Subacute right MCA territory infarcts with petechial hemorrhage in the right frontal lobe. No mass effect or midline shift.  COGNITION: Overall cognitive status: Within functional limits for tasks assessed   SENSATION: Diminished L lateral hand, L arm, L HS numbness; intact to light touch    COORDINATION: Alternating pronation/supination: slight bradykinesia Alternating toe tap: intact B Finger to nose: slight bradykinesia and dysmetria L Heel to shin: intact B   MUSCLE TONE: WNL B   POSTURE: rounded shoulders, FHP, slight trunk lean forward  LOWER EXTREMITY ROM:     Active  Right Eval Left Eval  Hip flexion    Hip extension    Hip abduction    Hip adduction    Hip internal rotation    Hip external rotation    Knee flexion    Knee extension    Ankle dorsiflexion 10 8  Ankle plantarflexion    Ankle inversion  Ankle eversion     (Blank rows = not tested)  LOWER EXTREMITY MMT:    MMT (in sitting) Right Eval Left Eval  Hip flexion 4+ 4-  Hip extension    Hip abduction 4 4  Hip adduction 4 4-  Hip internal rotation    Hip external rotation    Knee flexion 4 4  Knee extension 5 4+  Ankle dorsiflexion 5 5  Ankle plantarflexion 4+ 4+  Ankle inversion    Ankle eversion    (Blank rows = not tested)   GAIT: Gait pattern: reduced step length, flexed trunk, slowed Assistive device utilized: None Level of assistance: Modified independence   FUNCTIONAL TESTS:        M-CTSIB  Condition 1: Firm Surface, EO 30 Sec, Normal Sway  Condition 2: Firm Surface, EC 30 Sec, Normal and Mild Sway  Condition 3: Foam Surface, EO 30 Sec, Mild Sway  Condition 4: Foam Surface, EC 20 Sec, Moderate and Severe Sway     FOTO: [phone],                                                                                                                                TREATMENT DATE: 06/12/23    PATIENT EDUCATION: Education details: prognosis, POC, HEP Person educated: Patient Education method: Explanation, Demonstration, Tactile cues, Verbal cues, and Handouts Education comprehension: verbalized understanding and returned demonstration  HOME EXERCISE PROGRAM: Access Code: [email or web address] URL: [email or web address] Date: 06/12/2023 Prepared by: [facility] - [facility]  Exercises - Sidelying Hip Abduction  - 1 x daily - 3 x weekly - 2 sets - 10 reps - Tandem Walking with Counter Support  - 1 x daily - 3 x weekly - 2 sets - 5 reps - Hamstring Curl with Weight Machine  - 1 x daily - 3 x weekly - 2 sets - 10 reps - Hip Adduction Machine  - 1 x daily - 3 x weekly - 2 sets - 10 reps - Hip Abduction Machine  - 1 x daily - 3 x weekly - 2 sets - 10 reps  GOALS: Goals reviewed with patient? Yes  SHORT TERM GOALS: Target date: 07/03/2023  Patient to be independent with initial HEP. Baseline: HEP initiated Goal status: IN PROGRESS    LONG TERM GOALS: Target date: 07/24/2023  Patient to be independent with advanced HEP. Baseline: Not yet initiated  Goal status: IN PROGRESS  Patient to demonstrate B LE strength >/=4+/5.  Baseline: See above Goal status: IN PROGRESS  Patient to demonstrate B ankle dorsiflexion AROM at least 12 deg to improve gait efficiency.  Baseline: 10, 8 deg Goal status: IN PROGRESS  Patient to report return to gym regimen with modifications as needed.  Baseline: not yet returned Goal status: IN PROGRESS  Patient to demonstrate at least 25/30 on FGA in order to decrease risk of falls.  Baseline: 23 Goal status: IN PROGRESS  Patient  to score at least 84 on FOTO in order to indicate improved functional outcomes.  Baseline: 54 Goal status: IN PROGRESS  ASSESSMENT:  CLINICAL IMPRESSION: Patient arrived to session with report of working  out at home with equipment that he has at home. Denies recent episodes of dizziness but does report difficulty reading up close. Vestibular assessment revealed reduced L ocular inferior ROM, undershooting with L saccades, L convergence insufficiency, and saccades with L VOR cancellation. Patient was instructed on oculomotor exercises to address vision issues. HEP was reviewed and required heavy cueing for form and pacing for increased challenge/benefit. Able to progress hips strengthening activities today as well. Patient tolerated session well and without complaints upon leaving.    OBJECTIVE IMPAIRMENTS: Abnormal gait, decreased activity tolerance, decreased balance, decreased coordination, difficulty walking, decreased ROM, decreased strength, dizziness, impaired flexibility, impaired sensation, and postural dysfunction.   ACTIVITY LIMITATIONS: carrying, lifting, bending, standing, squatting, stairs, transfers, bathing, toileting, dressing, reach over head, hygiene/grooming, and locomotion level  PARTICIPATION LIMITATIONS: meal prep, cleaning, laundry, driving, shopping, community activity, yard work, and church  PERSONAL FACTORS: Age, Fitness, Past/current experiences, Time since onset of injury/illness/exacerbation, and 3+ comorbidities: Bladder CA s/p surgery 2009, HTN, HLD, psoriatic arthritis, DMII, partial colectomy 2024  are also affecting patient's functional outcome.   REHAB POTENTIAL: Good  CLINICAL DECISION MAKING: Evolving/moderate complexity  EVALUATION COMPLEXITY: Moderate  PLAN:  PT FREQUENCY: 1x/week  PT DURATION: 6 weeks  PLANNED INTERVENTIONS: 97164- PT Re-evaluation, 97110-Therapeutic exercises, 97530- Therapeutic activity, 97112- Neuromuscular re-education, 97535- Self Care, [id number]- Manual therapy, [phone]- Gait training, [phone]- Canalith repositioning, [id number]- Aquatic Therapy, 97014- Electrical stimulation (unattended), [phone]- Electrical stimulation (manual), Patient/Family  education, Balance training, Stair training, Taping, Dry Needling, Joint mobilization, Spinal mobilization, Vestibular training, Cryotherapy, and Moist heat  PLAN FOR NEXT SESSION: convergence/brock string, hip strengthening, balance with EC and head turns       Louana Terrilyn Christians, PT, DPT 06/19/23 2:03 PM  [facility] at [facility] [address], [address] [place], [place] [address] Phone # [phone] Fax # [phone]

## 2023-06-19 ENCOUNTER — Encounter: Payer: Self-pay | Admitting: Internal Medicine

## 2023-06-19 ENCOUNTER — Encounter: Payer: Self-pay | Admitting: Physical Therapy

## 2023-06-19 ENCOUNTER — Ambulatory Visit: Payer: Medicare Other | Admitting: Physical Therapy

## 2023-06-19 ENCOUNTER — Ambulatory Visit: Payer: Medicare Other | Admitting: Occupational Therapy

## 2023-06-19 DIAGNOSIS — R2681 Unsteadiness on feet: Secondary | ICD-10-CM | POA: Diagnosis not present

## 2023-06-19 DIAGNOSIS — R278 Other lack of coordination: Secondary | ICD-10-CM | POA: Diagnosis not present

## 2023-06-19 DIAGNOSIS — I69354 Hemiplegia and hemiparesis following cerebral infarction affecting left non-dominant side: Secondary | ICD-10-CM | POA: Diagnosis not present

## 2023-06-19 DIAGNOSIS — M6281 Muscle weakness (generalized): Secondary | ICD-10-CM | POA: Diagnosis not present

## 2023-06-19 DIAGNOSIS — R208 Other disturbances of skin sensation: Secondary | ICD-10-CM

## 2023-06-19 DIAGNOSIS — R2689 Other abnormalities of gait and mobility: Secondary | ICD-10-CM

## 2023-06-19 DIAGNOSIS — R42 Dizziness and giddiness: Secondary | ICD-10-CM

## 2023-06-19 DIAGNOSIS — I639 Cerebral infarction, unspecified: Secondary | ICD-10-CM | POA: Diagnosis not present

## 2023-06-19 NOTE — Therapy (Signed)
 OUTPATIENT OCCUPATIONAL THERAPY NEURO  Treatment Note  Patient Name: Joshua [name] MRN: [id number] DOB:Nov 01, 1955, 68 y.o., male Today's Date: 06/19/2023  PCP: Micheal Wolm ORN, MD REFERRING PROVIDER: Onita Duos, MD  END OF SESSION:  OT End of Session - 06/19/23 1458     Visit Number 2    Number of Visits 13    Date for OT Re-Evaluation 07/27/23    Authorization Type [employer] Medicare    OT Start Time 1453    OT Stop Time 1533    OT Time Calculation (min) 40 min              Past Medical History:  Diagnosis Date   Anal fistula    Hidradenitis    History of bladder cancer 2009   urologist-- dr rosalind---  s/p TURBT 2010,  recurrence in office 2014 ,  none since per pt   History of colonic polyps    History of COVID-19 04/2019   per pt had covid pneumonia recovered at home, no oxygen, symptoms resolved   History of diverticulitis of colon    History of hypertension 04/05/2009   per the pt he lost 55 lbs and htn has been reversed   History of kidney stones    Hyperlipidemia 04/05/2009   pt states he does not take med for cholesterol   OA (osteoarthritis) 04/05/2009   Psoriatic arthritis (HCC)    rheumonotologist--- dr a. ishmael   Type 2 diabetes mellitus (HCC) 04/05/2009   followed by pcp---  (01-31-2021  per pt only checks blood sugar once weekly, last fasting sugar-- 122)   Past Surgical History:  Procedure Laterality Date   COLONOSCOPY     last one 11-04-2020  by gessner   EXTRACORPOREAL SHOCK WAVE LITHOTRIPSY  2015   INCISION AND DRAINAGE ABSCESS N/A 02/03/2021   Procedure: EXCISION OF PERIANAL AND PERINEAL HYDRADENITIS, INTERROGATION OF PERIANAL WOUNDS;  Surgeon: Teresa Lonni HERO, MD;  Location: [facility];  Service: General;  Laterality: N/A;   PARTIAL COLECTOMY N/A 02/26/2023   Procedure: EXPLORATORY LAPAROTOMY; PARTIAL COLECTOMY; COLOSTOMY;  Surgeon: Vernetta Berg, MD;  Location: [facility];  Service: General;  Laterality: N/A;   RECTAL  EXAM UNDER ANESTHESIA  02/03/2021   Procedure: ANORECTAL EXAM UNDER ANESTHESIA;  Surgeon: Teresa Lonni HERO, MD;  Location: 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   Rectal bleeding 03/31/2020   Psoriatic arthritis (HCC) 02/21/2016   History of colonic polyp - sessile serrated polyp 03/12/2014   SHOULDER PAIN 01/05/2010   Malignant neoplasm of bladder (HCC) 04/05/2009   Type 2 diabetes mellitus (HCC) 04/05/2009   Hyperlipidemia 04/05/2009   Essential hypertension 04/05/2009   Osteoarthritis 04/05/2009    ONSET DATE: R MCA stroke in Dec 2024 (referral date 06/07/23)  REFERRING DIAG: I63.9 (ICD-10-CM) - Cerebrovascular accident (CVA), unspecified mechanism  THERAPY DIAG:  Muscle weakness (generalized)  Hemiplegia and hemiparesis following cerebral infarction affecting left non-dominant side (HCC)  Other disturbances of skin sensation  Other lack of coordination  Rationale for Evaluation and Treatment: Rehabilitation  SUBJECTIVE:   SUBJECTIVE STATEMENT: Pt reports I feel like I had a really good week.  Pt reports that  he started back on the treadmill, initially walking with holding the handles but able to progress to not needing to hold on as much and even doing some running.   Pt accompanied by: self (significant other dropped pt off - [name])  PERTINENT HISTORY: Bladder CA s/p surgery 2009, HTN, HLD, psoriatic arthritis, DMII, partial colectomy 2024   PRECAUTIONS: Other: colostomy bag (to be removed in March)  WEIGHT BEARING RESTRICTIONS: No  PAIN:  Are you having pain? No  FALLS: Has patient fallen in last 6  months? No  LIVING ENVIRONMENT: Lives with: lives with their spouse Lives in: House/apartment Stairs: Yes: Internal: full flight of steps to 2nd floor; bedroom and bathroom on 2nd floor steps; on left going up and External: 1 step in through the garage steps;   Has following equipment at home: None; seat in the shower but more to hold items   PLOF: Independent, Independent with basic ADLs, and Leisure: loves golfing  PATIENT GOALS: I want to feel back to normal  OBJECTIVE:  Note: Objective measures were completed at Evaluation unless otherwise noted.  HAND DOMINANCE: Right  ADLs: Transfers/ambulation related to ADLs: Little bit of challenge with balance, wife reports he walks different Eating: increased focus/effort when cutting foods Grooming: increased difficulty with coordination/strength when using L hand when shaving UB Dressing: increased difficulty with buttoning shirt LB Dressing: Mod I Toileting: Mod I Bathing: Mod I Tub Shower transfers: Supervision when bathing, walk-in shower Equipment: Walk in shower  IADLs:  Light housekeeping: wife completing majority, he will wash dishes occasionally and start the washing machine Meal Prep: helps spouse with aspects of meal prep, he will also make breakfast  Community mobility: wife does not want pt driving at this time, but no physician has told him not to drive Medication management: Mod I, utilizing pill box which he fills independently Financial management: shared with wife  MOBILITY STATUS:  reports that spouse states he walks differently now  POSTURE COMMENTS:  rounded shoulders, forward head, and slight forward trunk lean  ACTIVITY TOLERANCE: Activity tolerance: WFL for tasks assessed on eval  FUNCTIONAL OUTCOME MEASURES: FOTO: 67, predicted 75 in 16 visits  UPPER EXTREMITY ROM:  L shoulder droop compared to R  Active ROM Right eval Left eval  Shoulder flexion 128 113  Shoulder abduction    Shoulder  adduction    Shoulder extension    Shoulder internal rotation    Shoulder external rotation    Elbow flexion    Elbow extension    Wrist flexion    Wrist extension    Wrist ulnar deviation    Wrist radial deviation    Wrist pronation    Wrist supination    (Blank rows = not tested)  UPPER EXTREMITY MMT:     MMT Right eval Left eval  Shoulder flexion 4+ 4-  Shoulder abduction    Shoulder adduction    Shoulder extension    Shoulder internal rotation    Shoulder external rotation    Middle trapezius    Lower trapezius    Elbow flexion    Elbow extension    Wrist flexion    Wrist extension    Wrist ulnar deviation    Wrist radial deviation    Wrist pronation    Wrist supination    (Blank rows = not tested)  HAND FUNCTION: Grip strength: Right: 82 lbs; Left: 65 lbs  COORDINATION: 9 Hole Peg test: Right: 28.72 sec; Left: 34.03 sec Box and Blocks:  Right 58 blocks, Left 47blocks (more gross grasp with LUE)  SENSATION: Light touch: Impaired ; reports numbness in thumb, index, and long finger on L hand  MUSCLE TONE: WNL bilaterally  COGNITION: Overall cognitive status: No family/caregiver present to determine baseline cognitive functioning; reports some difficulty with comprehension and attention with reading.  VISION: Subjective report: sometimes I have to refocus and notices intermittent dizziness Baseline vision: Wears glasses for reading only  VISION ASSESSMENT: To be further assessed in functional context  Patient has difficulty with following activities due to following visual impairments: driving, reading, reports some difficulty with comprehension and attention with reading.  PRAXIS: Impaired: Motor planning  OBSERVATIONS: OT questions cognition and awareness of deficits.  Pt reports difficulty with driving and that his spouse currently does not want him driving.                                                                                                                              TREATMENT DATE:  06/19/23 Shoulder strengthening: engaged in shoulder shrugs and scapular retraction x10 with focus on symmetry.  OT educated on use of mirror to aid in visual attention and symmetry.  Engaged in strengthening with red theraband with shoulder flexion, shoulder extension, and scapular retraction x10.  OT providing cues and education on modification of length of theraband to increase and decrease resistance.  OT educating on focus on quality of movement with focus on maintaining elbow straight with elbow flexion and extension to further challenge ROM and strengthening. Hand Gripper: with LUE on level 70#. Pt picked up 1 inch blocks with gripper with min drops initially, however demonstrating improved motor control when visually attending to task to compensate for decreased sensation and strength.     06/12/23 NA, eval only     PATIENT EDUCATION: Education details: ongoing condition specific education, with focus on shoulder strengthening Person educated: Patient Education method: Explanation Education comprehension: verbalized understanding and needs further education  HOME EXERCISE PROGRAM: Access Code: [email or web address] URL: [email or web address] Date: 06/19/2023 Prepared by: [facility] - Outpatient  Rehab - [facility]  Program Notes Focus on quality of the movements.  Feel free to do less reps but more sets if that increases the quality.  Exercises - Seated Shoulder Shrugs  - 3 x weekly - 1-2 sets - 10 reps - 3-5 sec hold - Seated Scapular Retraction  - 3 x weekly - 2 sets - 10 reps - 3-5 sec hold - Shoulder Flexion with Resistance  - 3 x weekly - 2 sets - 10 reps - Seated Shoulder Extension with Resistance  - 3 x weekly - 2 sets - 10 reps - Scapular Retraction with Resistance  - 3 x weekly - 2 sets - 10 reps   GOALS: Goals reviewed with patient? Yes  SHORT TERM GOALS: Target date: 07/06/23  Pt will be independent with  coordination HEP. Baseline: Goal status: IN PROGRESS  2.  Pt will be independent with  L shoulder/UE strengthening HEP. Baseline:  Goal status: IN PROGRESS  3.  Pt will verbalize understanding of strategies to compensate for impaired sensation.  Baseline:  Goal status: IN PROGRESS  4.  Pt will demonstrate improved UE functional use for ADLs as evidenced by increasing box/ blocks score by 4 blocks with LUE. Baseline: Right 58 blocks, Left 47blocks  Goal status: IN PROGRESS   LONG TERM GOALS: Target date: 07/27/23  Pt will demonstrate ability to retrieve a moderate weight object at 125* shoulder flexion to demonstrated increased postural control, shoulder flexion, and UE strength as needed to complete ADLs and IADLs. Baseline: 113* Goal status: IN PROGRESS  2.  Pt will demonstrate improved fine motor coordination for ADLs as evidenced by decreasing 9 hole peg test score for LUE by 4 secs. Baseline: Right: 28.72 sec; Left: 34.03 sec Goal status: IN PROGRESS  3.  Pt will improved L hand grip strength by 6# to demonstrate increased sustained grasp as needed for ADLs and IADLs  Baseline: Right: 82 lbs; Left: 65 lbs Goal status: IN PROGRESS  4.  Pt will demonstrate ability to sequence simple functional task (simple snack prep, laundry task, etc) at Mod I level with good safety awareness Baseline:  Goal status: IN PROGRESS  5.  Pt will navigate a moderately busy environment, visually attending to surroundings and obstacles, while following multi-step commands with 90% accuracy. Baseline:  Goal status: IN PROGRESS  6.  Pt will verbalize understanding of safe return to driving recommendations Baseline:  Goal status: IN PROGRESS  ASSESSMENT:  CLINICAL IMPRESSION: Pt demonstrating improved shoulder symmetry this session, however continues to demonstrate decreased endurance with shoulder exercises with and without resistance.  Pt benefiting from cues and education on quality of  movement and positioning to increase quality.  Pt demonstrating improvements in grip strength with use of hand gripper this session.  OT educated on use of hand grippers and/or grip balls at home for additional grip strengthening.  Pt may benefit from red - green theraputty for additional grip and pinch strengthening.  PERFORMANCE DEFICITS: in functional skills including ADLs, IADLs, coordination, sensation, ROM, strength, pain, Fine motor control, Gross motor control, balance, body mechanics, endurance, decreased knowledge of precautions, decreased knowledge of use of DME, vision, and UE functional use, cognitive skills including safety awareness, sequencing, and understand, and psychosocial skills including coping strategies, environmental adaptation, and routines and behaviors.     PLAN:  OT FREQUENCY: 2x/week  OT DURATION: 6 weeks  PLANNED INTERVENTIONS: 97168 OT Re-evaluation, 97535 self care/ADL training, 02889 therapeutic exercise, 97530 therapeutic activity, 97112 neuromuscular re-education, 97140 manual therapy, 97035 ultrasound, 97018 paraffin, 02989 moist heat, 97010 cryotherapy, 97032 electrical stimulation (manual), passive range of motion, balance training, functional mobility training, compression bandaging, visual/perceptual remediation/compensation, psychosocial skills training, energy conservation, coping strategies training, patient/family education, and DME and/or AE instructions  RECOMMENDED OTHER SERVICES: possible SLP  CONSULTED AND AGREED WITH PLAN OF CARE: Patient  PLAN FOR NEXT SESSION:  initiate coordination HEP, review and add to UE strengthening HEP, further assess cognition and safety awareness, red or green theraputty for grip and pinch strength    Taahir Grisby, OTR/L 06/19/2023, 2:59 PM   [facility] at [facility] [address] [place], [place] [address] Phone # [phone] Fax # [phone]

## 2023-06-20 DIAGNOSIS — Z933 Colostomy status: Secondary | ICD-10-CM | POA: Diagnosis not present

## 2023-06-20 DIAGNOSIS — K5732 Diverticulitis of large intestine without perforation or abscess without bleeding: Secondary | ICD-10-CM | POA: Diagnosis not present

## 2023-06-21 ENCOUNTER — Ambulatory Visit: Payer: Medicare Other | Admitting: Occupational Therapy

## 2023-06-21 DIAGNOSIS — I639 Cerebral infarction, unspecified: Secondary | ICD-10-CM | POA: Diagnosis not present

## 2023-06-21 DIAGNOSIS — M6281 Muscle weakness (generalized): Secondary | ICD-10-CM | POA: Diagnosis not present

## 2023-06-21 DIAGNOSIS — R2681 Unsteadiness on feet: Secondary | ICD-10-CM | POA: Diagnosis not present

## 2023-06-21 DIAGNOSIS — R208 Other disturbances of skin sensation: Secondary | ICD-10-CM

## 2023-06-21 DIAGNOSIS — I69354 Hemiplegia and hemiparesis following cerebral infarction affecting left non-dominant side: Secondary | ICD-10-CM

## 2023-06-21 DIAGNOSIS — R278 Other lack of coordination: Secondary | ICD-10-CM | POA: Diagnosis not present

## 2023-06-21 DIAGNOSIS — R42 Dizziness and giddiness: Secondary | ICD-10-CM | POA: Diagnosis not present

## 2023-06-21 DIAGNOSIS — R2689 Other abnormalities of gait and mobility: Secondary | ICD-10-CM | POA: Diagnosis not present

## 2023-06-21 NOTE — Therapy (Signed)
OUTPATIENT OCCUPATIONAL THERAPY NEURO  Treatment Note  Patient Name: Joshua [name] MRN: [id number] DOB:[birth year], 68 y.o., male Today's Date: 06/21/2023  PCP: Kristian Covey, MD REFERRING PROVIDER: Levert Feinstein, MD  END OF SESSION:  OT End of Session - 06/21/23 1355     Visit Number 3    Number of Visits 13    Date for OT Re-Evaluation 07/27/23    Authorization Type [employer] Medicare    OT Start Time 1318    OT Stop Time 1400    OT Time Calculation (min) 42 min               Past Medical History:  Diagnosis Date   Anal fistula    Hidradenitis    History of bladder cancer 2009   urologist-- [name]---  s/p TURBT 2010,  recurrence in office 2014 ,  none since per pt   History of colonic polyps    History of COVID-19 04/2019   per pt had covid pneumonia recovered at home, no oxygen, symptoms resolved   History of diverticulitis of colon    History of hypertension 04/05/2009   per the pt he lost 55 lbs and htn has been reversed   History of kidney stones    Hyperlipidemia 04/05/2009   pt states he does not take med for cholesterol   OA (osteoarthritis) 04/05/2009   Psoriatic arthritis (HCC)    rheumonotologist--- [name]   Type 2 diabetes mellitus (HCC) 04/05/2009   followed by pcp---  (01-31-2021  per pt only checks blood sugar once weekly, last fasting sugar-- 122)   Past Surgical History:  Procedure Laterality Date   COLONOSCOPY     last one 11-04-2020  by gessner   EXTRACORPOREAL SHOCK WAVE LITHOTRIPSY  2015   INCISION AND DRAINAGE ABSCESS N/A 02/03/2021   Procedure: EXCISION OF PERIANAL AND PERINEAL HYDRADENITIS, INTERROGATION OF PERIANAL WOUNDS;  Surgeon: [name], MD;  Location: [facility];  Service: General;  Laterality: N/A;   PARTIAL COLECTOMY N/A 02/26/2023   Procedure: EXPLORATORY LAPAROTOMY; PARTIAL COLECTOMY; COLOSTOMY;  Surgeon: [name], MD;  Location: [facility];  Service: General;  Laterality: N/A;    RECTAL EXAM UNDER ANESTHESIA  02/03/2021   Procedure: ANORECTAL EXAM UNDER ANESTHESIA;  Surgeon: [name], MD;  Location: [facility];  Service: General;;   TONSILLECTOMY     child   TRANSURETHRAL RESECTION OF BLADDER TUMOR  02/2008   Patient Active Problem List   Diagnosis Date Noted   Cerebrovascular accident (CVA) (HCC) 06/07/2023   Protein-calorie malnutrition, severe 02/26/2023   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   Sigmoid diverticulitis 02/08/2023   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   Rectal bleeding 03/31/2020   Psoriatic arthritis (HCC) 02/21/2016   History of colonic polyp - sessile serrated polyp 03/12/2014   SHOULDER PAIN 01/05/2010   Malignant neoplasm of bladder (HCC) 04/05/2009   Type 2 diabetes mellitus (HCC) 04/05/2009   Hyperlipidemia 04/05/2009   Essential hypertension 04/05/2009   Osteoarthritis 04/05/2009    ONSET DATE: R MCA stroke in Dec 2024 (referral date 06/07/23)  REFERRING DIAG: I63.9 (ICD-10-CM) - Cerebrovascular accident (CVA), unspecified mechanism  THERAPY DIAG:  Muscle weakness (generalized)  Hemiplegia and hemiparesis following cerebral infarction affecting left non-dominant side (HCC)  Other disturbances of skin sensation  Other lack of coordination  Rationale for Evaluation and Treatment: Rehabilitation  SUBJECTIVE:   SUBJECTIVE STATEMENT: Pt reports "I had some bad news" - "they can't do the reversal on  the ostomy bag due to the stroke". Pt accompanied by: self (significant other dropped pt off - [name])  PERTINENT HISTORY: Bladder CA s/p surgery 2009, HTN, HLD, psoriatic arthritis, DMII, partial colectomy 2024   PRECAUTIONS: Other: colostomy bag  WEIGHT BEARING RESTRICTIONS: No  PAIN:  Are you having pain? No  FALLS: Has patient fallen in last 6 months? No  LIVING ENVIRONMENT: Lives with: lives with their spouse Lives in: House/apartment Stairs: Yes: Internal: full flight of steps to 2nd  floor; bedroom and bathroom on 2nd floor steps; on left going up and External: 1 step in through the garage steps;   Has following equipment at home: None; seat in the shower but more to hold items   PLOF: Independent, Independent with basic ADLs, and Leisure: loves golfing  PATIENT GOALS: I want to feel back to normal  OBJECTIVE:  Note: Objective measures were completed at Evaluation unless otherwise noted.  HAND DOMINANCE: Right  ADLs: Transfers/ambulation related to ADLs: Little bit of challenge with balance, wife reports he walks different Eating: increased focus/effort when cutting foods Grooming: increased difficulty with coordination/strength when using L hand when shaving UB Dressing: increased difficulty with buttoning shirt LB Dressing: Mod I Toileting: Mod I Bathing: Mod I Tub Shower transfers: Supervision when bathing, walk-in shower Equipment: Walk in shower  IADLs:  Light housekeeping: wife completing majority, he will wash dishes occasionally and start the washing machine Meal Prep: helps spouse with aspects of meal prep, he will also make breakfast  Community mobility: wife does not want pt driving at this time, but no physician has told him not to drive Medication management: Mod I, utilizing pill box which he fills independently Financial management: shared with wife  MOBILITY STATUS:  reports that spouse states he walks differently now  POSTURE COMMENTS:  rounded shoulders, forward head, and slight forward trunk lean  ACTIVITY TOLERANCE: Activity tolerance: WFL for tasks assessed on eval  FUNCTIONAL OUTCOME MEASURES: FOTO: 67, predicted 75 in 16 visits  UPPER EXTREMITY ROM:  L shoulder droop compared to R  Active ROM Right eval Left eval  Shoulder flexion 128 113  Shoulder abduction    Shoulder adduction    Shoulder extension    Shoulder internal rotation    Shoulder external rotation    Elbow flexion    Elbow extension    Wrist flexion     Wrist extension    Wrist ulnar deviation    Wrist radial deviation    Wrist pronation    Wrist supination    (Blank rows = not tested)  UPPER EXTREMITY MMT:     MMT Right eval Left eval  Shoulder flexion 4+ 4-  Shoulder abduction    Shoulder adduction    Shoulder extension    Shoulder internal rotation    Shoulder external rotation    Middle trapezius    Lower trapezius    Elbow flexion    Elbow extension    Wrist flexion    Wrist extension    Wrist ulnar deviation    Wrist radial deviation    Wrist pronation    Wrist supination    (Blank rows = not tested)  HAND FUNCTION: Grip strength: Right: 82 lbs; Left: 65 lbs  COORDINATION: 9 Hole Peg test: Right: 28.72 sec; Left: 34.03 sec Box and Blocks:  Right 58 blocks, Left 47blocks (more gross grasp with LUE)  SENSATION: Light touch: Impaired ; reports numbness in thumb, index, and long finger on L hand  MUSCLE  TONE: WNL bilaterally  COGNITION: Overall cognitive status: No family/caregiver present to determine baseline cognitive functioning; reports some difficulty with comprehension and attention with reading.  VISION: Subjective report: sometimes I have to "refocus" and notices intermittent dizziness Baseline vision: Wears glasses for reading only  VISION ASSESSMENT: To be further assessed in functional context  Patient has difficulty with following activities due to following visual impairments: driving, reading, reports some difficulty with comprehension and attention with reading.  PRAXIS: Impaired: Motor planning  OBSERVATIONS: OT questions cognition and awareness of deficits.  Pt reports difficulty with driving and that his spouse currently does not want him driving.                                                                                                                             TREATMENT DATE:  06/21/23 Resistance Clothespins 2,4,6# with LUE for mid and high functional reaching and sustained  pinch. Pt req'd min cues for movement pattern and maintaining good positioning of LUE with reach.  Pt reports "feeling" the work in the LUE. Coordination: with LUE, picking up variety of small items and placing into container, placing coins into coin slot, then picking up coins one at a time and translating from palm to fingertips to place into coin slot. Rotating golf ball: attempted rotating 2 golf balls in palm of hand with increased difficulty both clockwise and counterclockwise, frequently dropping balls.  Transitioned to rotating large dice in hand with cognitive task to rotate dice until particular sum as given by OT.  Pt with improved coordination with slightly smaller dice.    06/19/23 Shoulder strengthening: engaged in shoulder shrugs and scapular retraction x10 with focus on symmetry.  OT educated on use of mirror to aid in visual attention and symmetry.  Engaged in strengthening with red theraband with shoulder flexion, shoulder extension, and scapular retraction x10.  OT providing cues and education on modification of length of theraband to increase and decrease resistance.  OT educating on focus on quality of movement with focus on maintaining elbow straight with elbow flexion and extension to further challenge ROM and strengthening. Hand Gripper: with LUE on level 70#. Pt picked up 1 inch blocks with gripper with min drops initially, however demonstrating improved motor control when visually attending to task to compensate for decreased sensation and strength.     06/12/23 NA, eval only     PATIENT EDUCATION: Education details: ongoing condition specific education, with focus on [facility] Person educated: Patient Education method: Explanation, Demonstration, and Handouts Education comprehension: verbalized understanding and needs further education  HOME EXERCISE PROGRAM: Access Code: [email or web address] URL: [email or web address] Date: 06/19/2023 Prepared by: [facility] - Outpatient   Rehab - [facility]  Program Notes Focus on quality of the movements.  Feel free to do less reps but more sets if that increases the quality.  Exercises - Seated Shoulder Shrugs  - 3 x weekly - 1-2 sets - 10 reps - 3-5 sec hold -  Seated Scapular Retraction  - 3 x weekly - 2 sets - 10 reps - 3-5 sec hold - Shoulder Flexion with Resistance  - 3 x weekly - 2 sets - 10 reps - Seated Shoulder Extension with Resistance  - 3 x weekly - 2 sets - 10 reps - Scapular Retraction with Resistance  - 3 x weekly - 2 sets - 10 reps   GOALS: Goals reviewed with patient? Yes  SHORT TERM GOALS: Target date: 07/06/23  Pt will be independent with coordination HEP. Baseline: Goal status: IN PROGRESS  2.  Pt will be independent with L shoulder/UE strengthening HEP. Baseline:  Goal status: IN PROGRESS  3.  Pt will verbalize understanding of strategies to compensate for impaired sensation.  Baseline:  Goal status: IN PROGRESS  4.  Pt will demonstrate improved UE functional use for ADLs as evidenced by increasing box/ blocks score by 4 blocks with LUE. Baseline: Right 58 blocks, Left 47blocks  Goal status: IN PROGRESS   LONG TERM GOALS: Target date: 07/27/23  Pt will demonstrate ability to retrieve a moderate weight object at 125* shoulder flexion to demonstrated increased postural control, shoulder flexion, and UE strength as needed to complete ADLs and IADLs. Baseline: 113* Goal status: IN PROGRESS  2.  Pt will demonstrate improved fine motor coordination for ADLs as evidenced by decreasing 9 hole peg test score for LUE by 4 secs. Baseline: Right: 28.72 sec; Left: 34.03 sec Goal status: IN PROGRESS  3.  Pt will improved L hand grip strength by 6# to demonstrate increased sustained grasp as needed for ADLs and IADLs  Baseline: Right: 82 lbs; Left: 65 lbs Goal status: IN PROGRESS  4.  Pt will demonstrate ability to sequence simple functional task (simple snack prep, laundry task, etc)  at Mod I level with good safety awareness Baseline:  Goal status: IN PROGRESS  5.  Pt will navigate a moderately busy environment, visually attending to surroundings and obstacles, while following multi-step commands with 90% accuracy. Baseline:  Goal status: IN PROGRESS  6.  Pt will verbalize understanding of safe return to driving recommendations Baseline:  Goal status: IN PROGRESS  ASSESSMENT:  CLINICAL IMPRESSION: Pt reports initial difficulty with clothespin task and placing coins into coin slot due to visual spatial deficit.  OT educated on mutual use of vision, coordination, and functional reach with tasks this session.   Pt tolerating all tasks with good endurance and understanding of rationale. Pt may benefit from red - green theraputty for additional grip and pinch strengthening.  PERFORMANCE DEFICITS: in functional skills including ADLs, IADLs, coordination, sensation, ROM, strength, pain, Fine motor control, Gross motor control, balance, body mechanics, endurance, decreased knowledge of precautions, decreased knowledge of use of DME, vision, and UE functional use, cognitive skills including safety awareness, sequencing, and understand, and psychosocial skills including coping strategies, environmental adaptation, and routines and behaviors.     PLAN:  OT FREQUENCY: 2x/week  OT DURATION: 6 weeks  PLANNED INTERVENTIONS: 97168 OT Re-evaluation, 97535 self care/ADL training, 60454 therapeutic exercise, 97530 therapeutic activity, 97112 neuromuscular re-education, 97140 manual therapy, 97035 ultrasound, 97018 paraffin, 09811 moist heat, 97010 cryotherapy, 97032 electrical stimulation (manual), passive range of motion, balance training, functional mobility training, compression bandaging, visual/perceptual remediation/compensation, psychosocial skills training, energy conservation, coping strategies training, patient/family education, and DME and/or AE instructions  RECOMMENDED  OTHER SERVICES: possible SLP  CONSULTED AND AGREED WITH PLAN OF CARE: Patient  PLAN FOR NEXT SESSION:  Review and add to coordination HEP, review and  add to UE strengthening HEP, further assess cognition and safety awareness, red or green theraputty for grip and pinch strength    Seibert Keeter, OTR/L 06/21/2023, 1:55 PM   [facility] at [facility] [address], [address] [place], [place] [address] Phone # [phone] Fax # [phone]

## 2023-06-22 NOTE — Therapy (Signed)
OUTPATIENT PHYSICAL THERAPY NEURO TREATMENT   Patient Name: Joshua [name] MRN: [id number] DOB:[birth year], 68 y.o., male Today's Date: 06/26/2023   PCP: Kristian Covey, MD  REFERRING PROVIDER: Levert Feinstein, MD   END OF SESSION:  PT End of Session - 06/26/23 1611     Visit Number 3    Number of Visits 7    Date for PT Re-Evaluation 07/24/23    Authorization Type BCBS Medicare    PT Start Time 1536    PT Stop Time 1610    PT Time Calculation (min) 34 min    Equipment Utilized During Treatment Gait belt    Activity Tolerance Patient tolerated treatment well    Behavior During Therapy WFL for tasks assessed/performed               Past Medical History:  Diagnosis Date   Anal fistula    Hidradenitis    History of bladder cancer 2009   urologist-- [name]---  s/p TURBT 2010,  recurrence in office 2014 ,  none since per pt   History of colonic polyps    History of COVID-19 04/2019   per pt had covid pneumonia recovered at home, no oxygen, symptoms resolved   History of diverticulitis of colon    History of hypertension 04/05/2009   per the pt he lost 55 lbs and htn has been reversed   History of kidney stones    Hyperlipidemia 04/05/2009   pt states he does not take med for cholesterol   OA (osteoarthritis) 04/05/2009   Psoriatic arthritis (HCC)    rheumonotologist--- [name]   Type 2 diabetes mellitus (HCC) 04/05/2009   followed by pcp---  (01-31-2021  per pt only checks blood sugar once weekly, last fasting sugar-- 122)   Past Surgical History:  Procedure Laterality Date   COLONOSCOPY     last one 11-04-2020  by gessner   EXTRACORPOREAL SHOCK WAVE LITHOTRIPSY  2015   INCISION AND DRAINAGE ABSCESS N/A 02/03/2021   Procedure: EXCISION OF PERIANAL AND PERINEAL HYDRADENITIS, INTERROGATION OF PERIANAL WOUNDS;  Surgeon: [name], MD;  Location: [facility];  Service: General;  Laterality: N/A;   PARTIAL COLECTOMY N/A 02/26/2023    Procedure: EXPLORATORY LAPAROTOMY; PARTIAL COLECTOMY; COLOSTOMY;  Surgeon: [name], MD;  Location: [facility];  Service: General;  Laterality: N/A;   RECTAL EXAM UNDER ANESTHESIA  02/03/2021   Procedure: ANORECTAL EXAM UNDER ANESTHESIA;  Surgeon: [name], MD;  Location: [facility];  Service: General;;   TONSILLECTOMY     child   TRANSURETHRAL RESECTION OF BLADDER TUMOR  02/2008   Patient Active Problem List   Diagnosis Date Noted   Cerebrovascular accident (CVA) (HCC) 06/07/2023   Protein-calorie malnutrition, severe 02/26/2023   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   Sigmoid diverticulitis 02/08/2023   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   Rectal bleeding 03/31/2020   Psoriatic arthritis (HCC) 02/21/2016   History of colonic polyp - sessile serrated polyp 03/12/2014   SHOULDER PAIN 01/05/2010   Malignant neoplasm of bladder (HCC) 04/05/2009   Type 2 diabetes mellitus (HCC) 04/05/2009   Hyperlipidemia 04/05/2009   Essential hypertension 04/05/2009   Osteoarthritis 04/05/2009    ONSET DATE: 05/23/23  REFERRING DIAG: I63.9 (ICD-10-CM) - Cerebrovascular accident (CVA), unspecified mechanism (HCC)  THERAPY DIAG:  Muscle weakness (generalized)  Other disturbances of skin sensation  Unsteadiness on feet  Dizziness and giddiness  Rationale for Evaluation and Treatment: Rehabilitation  SUBJECTIVE:  SUBJECTIVE STATEMENT: Is past his injection for psoriatic arthritis so had more trouble running on the treadmill. HEP is going well.  Pt accompanied by: self  PERTINENT HISTORY: Bladder CA s/p surgery 2009, HTN, HLD, psoriatic arthritis, DMII, partial colectomy 2024  PAIN:  Are you having pain? No  PRECAUTIONS: Other: colostomy bag  RED FLAGS: None   WEIGHT  BEARING RESTRICTIONS: No  FALLS: Has patient fallen in last 6 months? 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 then the same thing but backwards step. Initial instability backwards which improved with practice; CGA             HOME EXERCISE PROGRAM Last updated: 06/26/23 Access Code: [email or web address] URL: [email or web address] Date: 06/26/2023 Prepared by: [facility] - Outpatient  Rehab - [facility]  Exercises - Sidelying Hip Abduction  - 1 x daily - 3 x weekly - 2 sets - 10 reps - Hamstring Curl with Weight Machine  - 1 x daily - 3 x weekly - 2 sets - 10 reps - Hip Adduction Machine  - 1 x daily - 3 x weekly - 2 sets - 10 reps - Hip Abduction Machine  - 1 x daily - 3 x weekly - 2 sets - 10 reps - Seated Vertical Smooth Pursuit  - 1 x daily - 5 x weekly - 2 sets - 10 reps - Side Stepping with Resistance at Ankles  - 1 x daily - 5 x weekly - 2 sets - 10 reps - Standing Tandem Balance with Counter Support  - 1 x daily - 5 x weekly - 2 sets - 30 sec hold   PATIENT EDUCATION: Education details: edu on brock string and handout, answered pt's questions on L sided neglex/reduced awareness ,HEP update Person educated: Patient Education method: Explanation, Demonstration, Tactile cues, Verbal cues, and Handouts Education comprehension: verbalized understanding and returned  demonstration        Note: Objective measures were completed at Evaluation unless otherwise noted.  DIAGNOSTIC FINDINGS: 05/23/23 brain MRI: Subacute right MCA territory infarcts with petechial hemorrhage in the right frontal lobe. No mass effect or midline shift.  COGNITION: Overall cognitive status: Within functional limits for tasks assessed   SENSATION: Diminished L lateral hand, L arm, L HS numbness; intact to light touch    COORDINATION: Alternating pronation/supination: slight bradykinesia Alternating toe tap: intact B Finger to nose: slight bradykinesia and dysmetria L Heel to shin: intact B   MUSCLE TONE: WNL B   POSTURE: rounded shoulders, FHP, slight trunk lean forward  LOWER EXTREMITY ROM:     Active  Right Eval Left Eval  Hip flexion    Hip extension    Hip abduction    Hip adduction    Hip internal rotation    Hip external rotation    Knee flexion    Knee extension  Ankle dorsiflexion 10 8  Ankle plantarflexion    Ankle inversion    Ankle eversion     (Blank rows = not tested)  LOWER EXTREMITY MMT:    MMT (in sitting) Right Eval Left Eval  Hip flexion 4+ 4-  Hip extension    Hip abduction 4 4  Hip adduction 4 4-  Hip internal rotation    Hip external rotation    Knee flexion 4 4  Knee extension 5 4+  Ankle dorsiflexion 5 5  Ankle plantarflexion 4+ 4+  Ankle inversion    Ankle eversion    (Blank rows = not tested)   GAIT: Gait pattern: reduced step length, flexed trunk, slowed Assistive device utilized: None Level of assistance: Modified independence   FUNCTIONAL TESTS:        M-CTSIB  Condition 1: Firm Surface, EO 30 Sec, Normal Sway  Condition 2: Firm Surface, EC 30 Sec, Normal and Mild Sway  Condition 3: Foam Surface, EO 30 Sec, Mild Sway  Condition 4: Foam Surface, EC 20 Sec, Moderate and Severe Sway     FOTO: [phone],                                                                                                                                TREATMENT DATE: 06/12/23    PATIENT EDUCATION: Education details: prognosis, POC, HEP Person educated: Patient Education method: Explanation, Demonstration, Tactile cues, Verbal cues, and Handouts Education comprehension: verbalized understanding and returned demonstration  HOME EXERCISE PROGRAM: Access Code: [email or web address] URL: [email or web address] Date: 06/12/2023 Prepared by: [facility] - Outpatient  Rehab - [facility]  Exercises - Sidelying Hip Abduction  - 1 x daily - 3 x weekly - 2 sets - 10 reps - Tandem Walking with Counter Support  - 1 x daily - 3 x weekly - 2 sets - 5 reps - Hamstring Curl with Weight Machine  - 1 x daily - 3 x weekly - 2 sets - 10 reps - Hip Adduction Machine  - 1 x daily - 3 x weekly - 2 sets - 10 reps - Hip Abduction Machine  - 1 x daily - 3 x weekly - 2 sets - 10 reps  GOALS: Goals reviewed with patient? Yes  SHORT TERM GOALS: Target date: 07/03/2023  Patient to be independent with initial HEP. Baseline: HEP initiated Goal status: IN PROGRESS    LONG TERM GOALS: Target date: 07/24/2023  Patient to be independent with advanced HEP. Baseline: Not yet initiated  Goal status: IN PROGRESS  Patient to demonstrate B LE strength >/=4+/5.  Baseline: See above Goal status: IN PROGRESS  Patient to demonstrate B ankle dorsiflexion AROM at least 12 deg to improve gait efficiency.  Baseline: 10, 8 deg Goal status: IN PROGRESS  Patient to report return to gym regimen with modifications as needed.  Baseline: not yet returned Goal status: IN PROGRESS  Patient to demonstrate at least 25/30 on FGA in  order to decrease risk of falls.  Baseline: 23 Goal status: IN PROGRESS  Patient to score at least 84 on FOTO in order to indicate improved functional outcomes.  Baseline: 54 Goal status: IN PROGRESS  ASSESSMENT:  CLINICAL IMPRESSION: Patient arrived to session with report of a flare of his psoriatic  arthritis; no other complaints. Progressed convergence training, today with use of brock string. Patient tolerated this well. Balance activities utilized dual task challenges with narrow BOS. However, patient is progressing with tandem walk and is less unsteady with this task. Patient is progressing well; no complaints at end of session.    OBJECTIVE IMPAIRMENTS: Abnormal gait, decreased activity tolerance, decreased balance, decreased coordination, difficulty walking, decreased ROM, decreased strength, dizziness, impaired flexibility, impaired sensation, and postural dysfunction.   ACTIVITY LIMITATIONS: carrying, lifting, bending, standing, squatting, stairs, transfers, bathing, toileting, dressing, reach over head, hygiene/grooming, and locomotion level  PARTICIPATION LIMITATIONS: meal prep, cleaning, laundry, driving, shopping, community activity, yard work, and church  PERSONAL FACTORS: Age, Fitness, Past/current experiences, Time since onset of injury/illness/exacerbation, and 3+ comorbidities: Bladder CA s/p surgery 2009, HTN, HLD, psoriatic arthritis, DMII, partial colectomy 2024  are also affecting patient's functional outcome.   REHAB POTENTIAL: Good  CLINICAL DECISION MAKING: Evolving/moderate complexity  EVALUATION COMPLEXITY: Moderate  PLAN:  PT FREQUENCY: 1x/week  PT DURATION: 6 weeks  PLANNED INTERVENTIONS: 97164- PT Re-evaluation, 97110-Therapeutic exercises, 97530- Therapeutic activity, 97112- Neuromuscular re-education, 97535- Self Care, 40981- Manual therapy, [phone]- Gait training, [phone]- Canalith repositioning, [id number]- Aquatic Therapy, 97014- Electrical stimulation (unattended), [phone]- Electrical stimulation (manual), Patient/Family education, Balance training, Stair training, Taping, Dry Needling, Joint mobilization, Spinal mobilization, Vestibular training, Cryotherapy, and Moist heat  PLAN FOR NEXT SESSION: convergence/brock string, hip strengthening, balance with EC and  head turns       Baldemar Friday, PT, DPT 06/26/23 4:14 PM  [facility] [address], [address] [place], [place] [address] Phone # [phone] Fax # [phone]

## 2023-06-26 ENCOUNTER — Encounter: Payer: Self-pay | Admitting: Physical Therapy

## 2023-06-26 ENCOUNTER — Ambulatory Visit: Payer: Medicare Other | Admitting: Physical Therapy

## 2023-06-26 ENCOUNTER — Ambulatory Visit: Payer: Medicare Other | Admitting: Occupational Therapy

## 2023-06-26 DIAGNOSIS — R208 Other disturbances of skin sensation: Secondary | ICD-10-CM

## 2023-06-26 DIAGNOSIS — R278 Other lack of coordination: Secondary | ICD-10-CM | POA: Diagnosis not present

## 2023-06-26 DIAGNOSIS — R2689 Other abnormalities of gait and mobility: Secondary | ICD-10-CM | POA: Diagnosis not present

## 2023-06-26 DIAGNOSIS — R2681 Unsteadiness on feet: Secondary | ICD-10-CM | POA: Diagnosis not present

## 2023-06-26 DIAGNOSIS — R42 Dizziness and giddiness: Secondary | ICD-10-CM | POA: Diagnosis not present

## 2023-06-26 DIAGNOSIS — M6281 Muscle weakness (generalized): Secondary | ICD-10-CM | POA: Diagnosis not present

## 2023-06-26 DIAGNOSIS — I69354 Hemiplegia and hemiparesis following cerebral infarction affecting left non-dominant side: Secondary | ICD-10-CM | POA: Diagnosis not present

## 2023-06-26 DIAGNOSIS — I639 Cerebral infarction, unspecified: Secondary | ICD-10-CM | POA: Diagnosis not present

## 2023-06-26 NOTE — Therapy (Signed)
OUTPATIENT OCCUPATIONAL THERAPY NEURO  Treatment Note  Patient Name: Joshua [name] MRN: [id number] DOB:[birth year], 68 y.o., male Today's Date: 06/26/2023  PCP: Kristian Covey, MD REFERRING PROVIDER: Levert Feinstein, MD  END OF SESSION:  OT End of Session - 06/26/23 1442     Visit Number 4    Number of Visits 13    Date for OT Re-Evaluation 07/27/23    Authorization Type BCBS Medicare    OT Start Time 1406    OT Stop Time 1445    OT Time Calculation (min) 39 min                Past Medical History:  Diagnosis Date   Anal fistula    Hidradenitis    History of bladder cancer 2009   urologist-- [name]---  s/p TURBT 2010,  recurrence in office 2014 ,  none since per pt   History of colonic polyps    History of COVID-19 04/2019   per pt had covid pneumonia recovered at home, no oxygen, symptoms resolved   History of diverticulitis of colon    History of hypertension 04/05/2009   per the pt he lost 55 lbs and htn has been reversed   History of kidney stones    Hyperlipidemia 04/05/2009   pt states he does not take med for cholesterol   OA (osteoarthritis) 04/05/2009   Psoriatic arthritis (HCC)    rheumonotologist--- [name]   Type 2 diabetes mellitus (HCC) 04/05/2009   followed by pcp---  (01-31-2021  per pt only checks blood sugar once weekly, last fasting sugar-- 122)   Past Surgical History:  Procedure Laterality Date   COLONOSCOPY     last one 11-04-2020  by gessner   EXTRACORPOREAL SHOCK WAVE LITHOTRIPSY  2015   INCISION AND DRAINAGE ABSCESS N/A 02/03/2021   Procedure: EXCISION OF PERIANAL AND PERINEAL HYDRADENITIS, INTERROGATION OF PERIANAL WOUNDS;  Surgeon: [name], MD;  Location: [facility];  Service: General;  Laterality: N/A;   PARTIAL COLECTOMY N/A 02/26/2023   Procedure: EXPLORATORY LAPAROTOMY; PARTIAL COLECTOMY; COLOSTOMY;  Surgeon: [name], MD;  Location: [facility];  Service: General;  Laterality: N/A;    RECTAL EXAM UNDER ANESTHESIA  02/03/2021   Procedure: ANORECTAL EXAM UNDER ANESTHESIA;  Surgeon: [name], MD;  Location: [facility];  Service: General;;   TONSILLECTOMY     child   TRANSURETHRAL RESECTION OF BLADDER TUMOR  02/2008   Patient Active Problem List   Diagnosis Date Noted   Cerebrovascular accident (CVA) (HCC) 06/07/2023   Protein-calorie malnutrition, severe 02/26/2023   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   Sigmoid diverticulitis 02/08/2023   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   Rectal bleeding 03/31/2020   Psoriatic arthritis (HCC) 02/21/2016   History of colonic polyp - sessile serrated polyp 03/12/2014   SHOULDER PAIN 01/05/2010   Malignant neoplasm of bladder (HCC) 04/05/2009   Type 2 diabetes mellitus (HCC) 04/05/2009   Hyperlipidemia 04/05/2009   Essential hypertension 04/05/2009   Osteoarthritis 04/05/2009    ONSET DATE: R MCA stroke in Dec 2024 (referral date 06/07/23)  REFERRING DIAG: I63.9 (ICD-10-CM) - Cerebrovascular accident (CVA), unspecified mechanism  THERAPY DIAG:  Muscle weakness (generalized)  Hemiplegia and hemiparesis following cerebral infarction affecting left non-dominant side (HCC)  Other disturbances of skin sensation  Other lack of coordination  Rationale for Evaluation and Treatment: Rehabilitation  SUBJECTIVE:   SUBJECTIVE STATEMENT: Pt reports stiff today, reporting that he is awaiting an injection that has  been placed on hold due to change in insurance.   Pt accompanied by: self (significant other dropped pt off - [name])  PERTINENT HISTORY: Bladder CA s/p surgery 2009, HTN, HLD, psoriatic arthritis, DMII, partial colectomy 2024   PRECAUTIONS: Other: colostomy bag  WEIGHT BEARING RESTRICTIONS: No  PAIN:  Are you having pain? No  FALLS: Has patient fallen in last 6 months? No  LIVING ENVIRONMENT: Lives with: lives with their spouse Lives in: House/apartment Stairs: Yes: Internal: full  flight of steps to 2nd floor; bedroom and bathroom on 2nd floor steps; on left going up and External: 1 step in through the garage steps;   Has following equipment at home: None; seat in the shower but more to hold items   PLOF: Independent, Independent with basic ADLs, and Leisure: loves golfing  PATIENT GOALS: I want to feel back to normal  OBJECTIVE:  Note: Objective measures were completed at Evaluation unless otherwise noted.  HAND DOMINANCE: Right  ADLs: Transfers/ambulation related to ADLs: Little bit of challenge with balance, wife reports he walks different Eating: increased focus/effort when cutting foods Grooming: increased difficulty with coordination/strength when using L hand when shaving UB Dressing: increased difficulty with buttoning shirt LB Dressing: Mod I Toileting: Mod I Bathing: Mod I Tub Shower transfers: Supervision when bathing, walk-in shower Equipment: Walk in shower  IADLs:  Light housekeeping: wife completing majority, he will wash dishes occasionally and start the washing machine Meal Prep: helps spouse with aspects of meal prep, he will also make breakfast  Community mobility: wife does not want pt driving at this time, but no physician has told him not to drive Medication management: Mod I, utilizing pill box which he fills independently Financial management: shared with wife  MOBILITY STATUS:  reports that spouse states he walks differently now  POSTURE COMMENTS:  rounded shoulders, forward head, and slight forward trunk lean  ACTIVITY TOLERANCE: Activity tolerance: WFL for tasks assessed on eval  FUNCTIONAL OUTCOME MEASURES: FOTO: 67, predicted 75 in 16 visits  UPPER EXTREMITY ROM:  L shoulder droop compared to R  Active ROM Right eval Left eval  Shoulder flexion 128 113  Shoulder abduction    Shoulder adduction    Shoulder extension    Shoulder internal rotation    Shoulder external rotation    Elbow flexion    Elbow extension     Wrist flexion    Wrist extension    Wrist ulnar deviation    Wrist radial deviation    Wrist pronation    Wrist supination    (Blank rows = not tested)  UPPER EXTREMITY MMT:     MMT Right eval Left eval  Shoulder flexion 4+ 4-  Shoulder abduction    Shoulder adduction    Shoulder extension    Shoulder internal rotation    Shoulder external rotation    Middle trapezius    Lower trapezius    Elbow flexion    Elbow extension    Wrist flexion    Wrist extension    Wrist ulnar deviation    Wrist radial deviation    Wrist pronation    Wrist supination    (Blank rows = not tested)  HAND FUNCTION: Grip strength: Right: 82 lbs; Left: 65 lbs  COORDINATION: 9 Hole Peg test: Right: 28.72 sec; Left: 34.03 sec Box and Blocks:  Right 58 blocks, Left 47blocks (more gross grasp with LUE)  SENSATION: Light touch: Impaired ; reports numbness in thumb, index, and long finger on  L hand  MUSCLE TONE: WNL bilaterally  COGNITION: Overall cognitive status: No family/caregiver present to determine baseline cognitive functioning; reports some difficulty with comprehension and attention with reading.  VISION: Subjective report: sometimes I have to "refocus" and notices intermittent dizziness Baseline vision: Wears glasses for reading only  VISION ASSESSMENT: To be further assessed in functional context  Patient has difficulty with following activities due to following visual impairments: driving, reading, reports some difficulty with comprehension and attention with reading.  PRAXIS: Impaired: Motor planning  OBSERVATIONS: OT questions cognition and awareness of deficits.  Pt reports difficulty with driving and that his spouse currently does not want him driving.                                                                                                                             TREATMENT DATE:  06/26/23 UE AROM: engaged in tapping numbers in clock pattern with LUE as OT  calling out numbers in spontaneous order to challenge direction changing and AROM.  Pt requiring min increased time to scan numbers.  Engaged in wall slides, completing shoulder flexion x10 and shoulder abduction x10 with focus on sustained hold at end range ~3 seconds. Gross motor Coordination: engaged in moving rings from cone to cone with LUE while focusing on motor control, trying not to hit the cone stack - incorporating shoulder flexion and horizontal abduction/adduction.  Pt reports noticing mild weakness in shoulder at highest level.   Putty: engaged in grip and pinch strengthening with green theraputty with focus on full hand grasp, tip to tip, 3 point pinch, and key pinch x10 with LUE.  Pt completing pinch and pull as well as removal of stones from putty with focus on pinch strengthening and coordination when removing pegs.      06/21/23 Resistance Clothespins 2,4,6# with LUE for mid and high functional reaching and sustained pinch. Pt req'd min cues for movement pattern and maintaining good positioning of LUE with reach.  Pt reports "feeling" the work in the LUE. Coordination: with LUE, picking up variety of small items and placing into container, placing coins into coin slot, then picking up coins one at a time and translating from palm to fingertips to place into coin slot. Rotating golf ball: attempted rotating 2 golf balls in palm of hand with increased difficulty both clockwise and counterclockwise, frequently dropping balls.  Transitioned to rotating large dice in hand with cognitive task to rotate dice until particular sum as given by OT.  Pt with improved coordination with slightly smaller dice.    06/19/23 Shoulder strengthening: engaged in shoulder shrugs and scapular retraction x10 with focus on symmetry.  OT educated on use of mirror to aid in visual attention and symmetry.  Engaged in strengthening with red theraband with shoulder flexion, shoulder extension, and scapular  retraction x10.  OT providing cues and education on modification of length of theraband to increase and decrease resistance.  OT educating on focus on  quality of movement with focus on maintaining elbow straight with elbow flexion and extension to further challenge ROM and strengthening. Hand Gripper: with LUE on level 70#. Pt picked up 1 inch blocks with gripper with min drops initially, however demonstrating improved motor control when visually attending to task to compensate for decreased sensation and strength.     PATIENT EDUCATION: Education details: ongoing condition specific education, with focus on [facility] Person educated: Patient Education method: Explanation, Demonstration, and Handouts Education comprehension: verbalized understanding and needs further education  HOME EXERCISE PROGRAM: Access Code: [email or web address] URL: [email or web address] Date: 06/19/2023 Prepared by: [facility] - Outpatient  Rehab - [facility]  Program Notes Focus on quality of the movements.  Feel free to do less reps but more sets if that increases the quality.  Exercises - Seated Shoulder Shrugs  - 3 x weekly - 1-2 sets - 10 reps - 3-5 sec hold - Seated Scapular Retraction  - 3 x weekly - 2 sets - 10 reps - 3-5 sec hold - Shoulder Flexion with Resistance  - 3 x weekly - 2 sets - 10 reps - Seated Shoulder Extension with Resistance  - 3 x weekly - 2 sets - 10 reps - Scapular Retraction with Resistance  - 3 x weekly - 2 sets - 10 reps  Access Code: [email or web address] URL: [email or web address] Date: 06/26/2023 Prepared by: [facility] - Outpatient  Rehab - [facility]  Exercises - Putty Squeezes  - 2 x daily - 10 reps - Rolling Putty on Table  - 2 x daily - Tip Pinch with Putty  - 2 x daily - 10 reps - Key Pinch with Putty  - 2 x daily - 5 reps - Finger Lumbricals with Putty  - 2 x daily - 10 reps - Finger Pinch and Pull with Putty  - 2 x daily - 10 reps - Removing Marbles from  Putty  - 2 x daily - 10 reps  GOALS: Goals reviewed with patient? Yes  SHORT TERM GOALS: Target date: 07/06/23  Pt will be independent with coordination HEP. Baseline: Goal status: IN PROGRESS  2.  Pt will be independent with L shoulder/UE strengthening HEP. Baseline:  Goal status: IN PROGRESS  3.  Pt will verbalize understanding of strategies to compensate for impaired sensation.  Baseline:  Goal status: IN PROGRESS  4.  Pt will demonstrate improved UE functional use for ADLs as evidenced by increasing box/ blocks score by 4 blocks with LUE. Baseline: Right 58 blocks, Left 47blocks  Goal status: IN PROGRESS   LONG TERM GOALS: Target date: 07/27/23  Pt will demonstrate ability to retrieve a moderate weight object at 125* shoulder flexion to demonstrated increased postural control, shoulder flexion, and UE strength as needed to complete ADLs and IADLs. Baseline: 113* Goal status: IN PROGRESS  2.  Pt will demonstrate improved fine motor coordination for ADLs as evidenced by decreasing 9 hole peg test score for LUE by 4 secs. Baseline: Right: 28.72 sec; Left: 34.03 sec Goal status: IN PROGRESS  3.  Pt will improved L hand grip strength by 6# to demonstrate increased sustained grasp as needed for ADLs and IADLs  Baseline: Right: 82 lbs; Left: 65 lbs Goal status: IN PROGRESS  4.  Pt will demonstrate ability to sequence simple functional task (simple snack prep, laundry task, etc) at Mod I level with good safety awareness Baseline:  Goal status: IN PROGRESS  5.  Pt will navigate a moderately busy environment, visually attending to surroundings and  obstacles, while following multi-step commands with 90% accuracy. Baseline:  Goal status: IN PROGRESS  6.  Pt will verbalize understanding of safe return to driving recommendations Baseline:  Goal status: IN PROGRESS  ASSESSMENT:  CLINICAL IMPRESSION: Pt tolerating all tasks with good endurance and understanding of rationale.   Pt demonstrating good LUE AROM this session with wall slides and clock tapping.  Pt with good ability to engage in pinch and grip strengthening tasks with green theraputty with min cues for technique with key pinch and "duck mouth".  PERFORMANCE DEFICITS: in functional skills including ADLs, IADLs, coordination, sensation, ROM, strength, pain, Fine motor control, Gross motor control, balance, body mechanics, endurance, decreased knowledge of precautions, decreased knowledge of use of DME, vision, and UE functional use, cognitive skills including safety awareness, sequencing, and understand, and psychosocial skills including coping strategies, environmental adaptation, and routines and behaviors.     PLAN:  OT FREQUENCY: 2x/week  OT DURATION: 6 weeks  PLANNED INTERVENTIONS: 97168 OT Re-evaluation, 97535 self care/ADL training, 91478 therapeutic exercise, 97530 therapeutic activity, 97112 neuromuscular re-education, 97140 manual therapy, 97035 ultrasound, 97018 paraffin, 29562 moist heat, 97010 cryotherapy, 97032 electrical stimulation (manual), passive range of motion, balance training, functional mobility training, compression bandaging, visual/perceptual remediation/compensation, psychosocial skills training, energy conservation, coping strategies training, patient/family education, and DME and/or AE instructions  RECOMMENDED OTHER SERVICES: possible SLP  CONSULTED AND AGREED WITH PLAN OF CARE: Patient  PLAN FOR NEXT SESSION:  Review and add to coordination HEP, review and add to UE strengthening HEP, further assess cognition and safety awareness, Grip strength with hand gripper   Rosalio Loud, OTR/L 06/26/2023, 2:42 PM   [facility] at [facility] [address], [address] [place], [place] [address] Phone # [phone] Fax # [phone]

## 2023-06-28 ENCOUNTER — Ambulatory Visit: Payer: Medicare Other | Admitting: Occupational Therapy

## 2023-06-28 DIAGNOSIS — R2689 Other abnormalities of gait and mobility: Secondary | ICD-10-CM | POA: Diagnosis not present

## 2023-06-28 DIAGNOSIS — I639 Cerebral infarction, unspecified: Secondary | ICD-10-CM | POA: Diagnosis not present

## 2023-06-28 DIAGNOSIS — I69354 Hemiplegia and hemiparesis following cerebral infarction affecting left non-dominant side: Secondary | ICD-10-CM

## 2023-06-28 DIAGNOSIS — R278 Other lack of coordination: Secondary | ICD-10-CM

## 2023-06-28 DIAGNOSIS — M6281 Muscle weakness (generalized): Secondary | ICD-10-CM | POA: Diagnosis not present

## 2023-06-28 DIAGNOSIS — R42 Dizziness and giddiness: Secondary | ICD-10-CM | POA: Diagnosis not present

## 2023-06-28 DIAGNOSIS — R208 Other disturbances of skin sensation: Secondary | ICD-10-CM | POA: Diagnosis not present

## 2023-06-28 DIAGNOSIS — R2681 Unsteadiness on feet: Secondary | ICD-10-CM | POA: Diagnosis not present

## 2023-06-28 NOTE — Therapy (Signed)
OUTPATIENT OCCUPATIONAL THERAPY NEURO  Treatment Note  Patient Name: Joshua [name] MRN: [id number] DOB:1956-03-11, 68 y.o., male Today's Date: 06/28/2023  PCP: Kristian Covey, MD REFERRING PROVIDER: Levert Feinstein, MD  END OF SESSION:  OT End of Session - 06/28/23 1107     Visit Number 5    Number of Visits 13    Date for OT Re-Evaluation 07/27/23    Authorization Type BCBS Medicare    OT Start Time 1103    OT Stop Time 1145    OT Time Calculation (min) 42 min                 Past Medical History:  Diagnosis Date   Anal fistula    Hidradenitis    History of bladder cancer 2009   urologist-- [name]---  s/p TURBT 2010,  recurrence in office 2014 ,  none since per pt   History of colonic polyps    History of COVID-19 04/2019   per pt had covid pneumonia recovered at home, no oxygen, symptoms resolved   History of diverticulitis of colon    History of hypertension 04/05/2009   per the pt he lost 55 lbs and htn has been reversed   History of kidney stones    Hyperlipidemia 04/05/2009   pt states he does not take med for cholesterol   OA (osteoarthritis) 04/05/2009   Psoriatic arthritis (HCC)    rheumonotologist--- [name]   Type 2 diabetes mellitus (HCC) 04/05/2009   followed by pcp---  (01-31-2021  per pt only checks blood sugar once weekly, last fasting sugar-- 122)   Past Surgical History:  Procedure Laterality Date   COLONOSCOPY     last one 11-04-2020  by gessner   EXTRACORPOREAL SHOCK WAVE LITHOTRIPSY  2015   INCISION AND DRAINAGE ABSCESS N/A 02/03/2021   Procedure: EXCISION OF PERIANAL AND PERINEAL HYDRADENITIS, INTERROGATION OF PERIANAL WOUNDS;  Surgeon: [name], MD;  Location: [facility];  Service: General;  Laterality: N/A;   PARTIAL COLECTOMY N/A 02/26/2023   Procedure: EXPLORATORY LAPAROTOMY; PARTIAL COLECTOMY; COLOSTOMY;  Surgeon: [name], MD;  Location: [facility];  Service: General;  Laterality: N/A;    RECTAL EXAM UNDER ANESTHESIA  02/03/2021   Procedure: ANORECTAL EXAM UNDER ANESTHESIA;  Surgeon: [name], MD;  Location: [facility];  Service: General;;   TONSILLECTOMY     child   TRANSURETHRAL RESECTION OF BLADDER TUMOR  02/2008   Patient Active Problem List   Diagnosis Date Noted   Cerebrovascular accident (CVA) (HCC) 06/07/2023   Protein-calorie malnutrition, severe 02/26/2023   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   Sigmoid diverticulitis 02/08/2023   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   Rectal bleeding 03/31/2020   Psoriatic arthritis (HCC) 02/21/2016   History of colonic polyp - sessile serrated polyp 03/12/2014   SHOULDER PAIN 01/05/2010   Malignant neoplasm of bladder (HCC) 04/05/2009   Type 2 diabetes mellitus (HCC) 04/05/2009   Hyperlipidemia 04/05/2009   Essential hypertension 04/05/2009   Osteoarthritis 04/05/2009    ONSET DATE: R MCA stroke in Dec 2024 (referral date 06/07/23)  REFERRING DIAG: I63.9 (ICD-10-CM) - Cerebrovascular accident (CVA), unspecified mechanism  THERAPY DIAG:  Muscle weakness (generalized)  Other disturbances of skin sensation  Hemiplegia and hemiparesis following cerebral infarction affecting left non-dominant side (HCC)  Other lack of coordination  Rationale for Evaluation and Treatment: Rehabilitation  SUBJECTIVE:   SUBJECTIVE STATEMENT: Pt reports he has an upcoming infusion next week and should feel  much better after that.   Pt accompanied by: self (significant other dropped pt off - wife [name])  PERTINENT HISTORY: Bladder CA s/p surgery 2009, HTN, HLD, psoriatic arthritis, DMII, partial colectomy 2024   PRECAUTIONS: Other: colostomy bag  WEIGHT BEARING RESTRICTIONS: No  PAIN:  Are you having pain? No  FALLS: Has patient fallen in last 6 months? No  LIVING ENVIRONMENT: Lives with: lives with their spouse Lives in: House/apartment Stairs: Yes: Internal: full flight of steps to 2nd floor;  bedroom and bathroom on 2nd floor steps; on left going up and External: 1 step in through the garage steps;   Has following equipment at home: None; seat in the shower but more to hold items   PLOF: Independent, Independent with basic ADLs, and Leisure: loves golfing  PATIENT GOALS: I want to feel back to normal  OBJECTIVE:  Note: Objective measures were completed at Evaluation unless otherwise noted.  HAND DOMINANCE: Right  ADLs: Transfers/ambulation related to ADLs: Little bit of challenge with balance, wife reports he walks different Eating: increased focus/effort when cutting foods Grooming: increased difficulty with coordination/strength when using L hand when shaving UB Dressing: increased difficulty with buttoning shirt LB Dressing: Mod I Toileting: Mod I Bathing: Mod I Tub Shower transfers: Supervision when bathing, walk-in shower Equipment: Walk in shower  IADLs:  Light housekeeping: wife completing majority, he will wash dishes occasionally and start the washing machine Meal Prep: helps spouse with aspects of meal prep, he will also make breakfast  Community mobility: wife does not want pt driving at this time, but no physician has told him not to drive Medication management: Mod I, utilizing pill box which he fills independently Financial management: shared with wife  MOBILITY STATUS:  reports that spouse states he walks differently now  POSTURE COMMENTS:  rounded shoulders, forward head, and slight forward trunk lean  ACTIVITY TOLERANCE: Activity tolerance: WFL for tasks assessed on eval  FUNCTIONAL OUTCOME MEASURES: FOTO: 67, predicted 75 in 16 visits  UPPER EXTREMITY ROM:  L shoulder droop compared to R  Active ROM Right eval Left eval  Shoulder flexion 128 113  Shoulder abduction    Shoulder adduction    Shoulder extension    Shoulder internal rotation    Shoulder external rotation    Elbow flexion    Elbow extension    Wrist flexion    Wrist  extension    Wrist ulnar deviation    Wrist radial deviation    Wrist pronation    Wrist supination    (Blank rows = not tested)  UPPER EXTREMITY MMT:     MMT Right eval Left eval  Shoulder flexion 4+ 4-  Shoulder abduction    Shoulder adduction    Shoulder extension    Shoulder internal rotation    Shoulder external rotation    Middle trapezius    Lower trapezius    Elbow flexion    Elbow extension    Wrist flexion    Wrist extension    Wrist ulnar deviation    Wrist radial deviation    Wrist pronation    Wrist supination    (Blank rows = not tested)  HAND FUNCTION: Grip strength: Right: 82 lbs; Left: 65 lbs  COORDINATION: 9 Hole Peg test: Right: 28.72 sec; Left: 34.03 sec Box and Blocks:  Right 58 blocks, Left 47blocks (more gross grasp with LUE)  SENSATION: Light touch: Impaired ; reports numbness in thumb, index, and long finger on L hand  MUSCLE TONE:  WNL bilaterally  COGNITION: Overall cognitive status: No family/caregiver present to determine baseline cognitive functioning; reports some difficulty with comprehension and attention with reading.  VISION: Subjective report: sometimes I have to "refocus" and notices intermittent dizziness Baseline vision: Wears glasses for reading only  VISION ASSESSMENT: To be further assessed in functional context  Patient has difficulty with following activities due to following visual impairments: driving, reading, reports some difficulty with comprehension and attention with reading.  PRAXIS: Impaired: Motor planning  OBSERVATIONS: OT questions cognition and awareness of deficits.  Pt reports difficulty with driving and that his spouse currently does not want him driving.                                                                                                                             TREATMENT DATE:  06/28/23 Hand Gripper: with LUE on level 70#.Marland Kitchen Pt picked up 1 inch blocks with gripper with 4 drops and  min difficulty as placing into container on elevated surface to further challenge sustained grasp with movement.  OT removed elevated surface with mild improvements in sustained grasp, however still fatiguing as task continued.  Pt agreeable to attempting increased resistance to 75# with good success and no drops.  OT then adding elevated surface back to task with pt demonstrating good ability to complete with 3 drops.  OT recommending continued use of green theraputty for grip strength as well as recommending purchase of grip strengthener. Box and blocks: LUE 45 blocks.  Pt reports improved quality, just fatigued from above tasks.  OT noticed pt still with decreased integration of index finger when picking up blocks.   Grooved Pegs: with LUE for increased coordination and integration of index finger into task. Pt placed pegs with one at a time and removed with in hand manipulation holding up to 5 at a time in hand. Pt completed with reports of mild-mod difficulty and 2 drops when placing pegs and 1 drop when removing. Impaired sensation: OT educating on visual attention to UE during reaching and with managing items that are hot or sharp.  OT educating on homunculus image with focus on area of stroke and motor and sensory impairment due to location of stroke.      06/26/23 UE AROM: engaged in tapping numbers in clock pattern with LUE as OT calling out numbers in spontaneous order to challenge direction changing and AROM.  Pt requiring min increased time to scan numbers.  Engaged in wall slides, completing shoulder flexion x10 and shoulder abduction x10 with focus on sustained hold at end range ~3 seconds. Gross motor Coordination: engaged in moving rings from cone to cone with LUE while focusing on motor control, trying not to hit the cone stack - incorporating shoulder flexion and horizontal abduction/adduction.  Pt reports noticing mild weakness in shoulder at highest level.   Putty: engaged in grip and  pinch strengthening with green theraputty with focus on full hand grasp, tip to tip, 3  point pinch, and key pinch x10 with LUE.  Pt completing pinch and pull as well as removal of stones from putty with focus on pinch strengthening and coordination when removing pegs.      06/21/23 Resistance Clothespins 2,4,6# with LUE for mid and high functional reaching and sustained pinch. Pt req'd min cues for movement pattern and maintaining good positioning of LUE with reach.  Pt reports "feeling" the work in the LUE. Coordination: with LUE, picking up variety of small items and placing into container, placing coins into coin slot, then picking up coins one at a time and translating from palm to fingertips to place into coin slot. Rotating golf ball: attempted rotating 2 golf balls in palm of hand with increased difficulty both clockwise and counterclockwise, frequently dropping balls.  Transitioned to rotating large dice in hand with cognitive task to rotate dice until particular sum as given by OT.  Pt with improved coordination with slightly smaller dice.    PATIENT EDUCATION: Education details: ongoing condition specific education, with focus on [facility] Person educated: Patient Education method: Explanation, Demonstration, and Handouts Education comprehension: verbalized understanding and needs further education  HOME EXERCISE PROGRAM: Access Code: [email or web address] URL: [email or web address] Date: 06/19/2023 Prepared by: [facility] - Outpatient  Re[facility] - [facility]  Program Notes Focus on quality of the movements.  Feel free to do less reps but more sets if that increases the quality.  Exercises - Seated Shoulder Shrugs  - 3 x weekly - 1-2 sets - 10 reps - 3-5 sec hold - Seated Scapular Retraction  - 3 x weekly - 2 sets - 10 reps - 3-5 sec hold - Shoulder Flexion with Resistance  - 3 x weekly - 2 sets - 10 reps - Seated Shoulder Extension with Resistance  - 3 x weekly - 2 sets - 10  reps - Scapular Retraction with Resistance  - 3 x weekly - 2 sets - 10 reps  Access Code: [email or web address] URL: [email or web address] Date: 06/26/2023 Prepared by: [facility] - Outpatient  Rehab - [facility]  Exercises - Putty Squeezes  - 2 x daily - 10 reps - Rolling Putty on Table  - 2 x daily - Tip Pinch with Putty  - 2 x daily - 10 reps - Key Pinch with Putty  - 2 x daily - 5 reps - Finger Lumbricals with Putty  - 2 x daily - 10 reps - Finger Pinch and Pull with Putty  - 2 x daily - 10 reps - Removing Marbles from Putty  - 2 x daily - 10 reps  GOALS: Goals reviewed with patient? Yes  SHORT TERM GOALS: Target date: 07/06/23  Pt will be independent with coordination HEP. Baseline: Goal status: IN PROGRESS  2.  Pt will be independent with L shoulder/UE strengthening HEP. Baseline:  Goal status: IN PROGRESS  3.  Pt will verbalize understanding of strategies to compensate for impaired sensation.  Baseline:  Goal status: IN PROGRESS  4.  Pt will demonstrate improved UE functional use for ADLs as evidenced by increasing box/ blocks score by 4 blocks with LUE. Baseline: Right 58 blocks, Left 47blocks  Goal status: IN PROGRESS   LONG TERM GOALS: Target date: 07/27/23  Pt will demonstrate ability to retrieve a moderate weight object at 125* shoulder flexion to demonstrated increased postural control, shoulder flexion, and UE strength as needed to complete ADLs and IADLs. Baseline: 113* Goal status: IN PROGRESS  2.  Pt will demonstrate improved fine motor coordination for  ADLs as evidenced by decreasing 9 hole peg test score for LUE by 4 secs. Baseline: Right: 28.72 sec; Left: 34.03 sec Goal status: IN PROGRESS  3.  Pt will improved L hand grip strength by 6# to demonstrate increased sustained grasp as needed for ADLs and IADLs  Baseline: Right: 82 lbs; Left: 65 lbs Goal status: IN PROGRESS  4.  Pt will demonstrate ability to sequence simple functional task  (simple snack prep, laundry task, etc) at Mod I level with good safety awareness Baseline:  Goal status: IN PROGRESS  5.  Pt will navigate a moderately busy environment, visually attending to surroundings and obstacles, while following multi-step commands with 90% accuracy. Baseline:  Goal status: IN PROGRESS  6.  Pt will verbalize understanding of safe return to driving recommendations Baseline:  Goal status: IN PROGRESS  ASSESSMENT:  CLINICAL IMPRESSION: Pt tolerating all tasks with good endurance and understanding of rationale.  Pt demonstrating good LUE strength and coordination this session. Pt demonstrating min difficulty with sustained grasp with 70# resistance, however with improvements with 75# resistance.  PERFORMANCE DEFICITS: in functional skills including ADLs, IADLs, coordination, sensation, ROM, strength, pain, Fine motor control, Gross motor control, balance, body mechanics, endurance, decreased knowledge of precautions, decreased knowledge of use of DME, vision, and UE functional use, cognitive skills including safety awareness, sequencing, and understand, and psychosocial skills including coping strategies, environmental adaptation, and routines and behaviors.     PLAN:  OT FREQUENCY: 2x/week  OT DURATION: 6 weeks  PLANNED INTERVENTIONS: 97168 OT Re-evaluation, 97535 self care/ADL training, 16109 therapeutic exercise, 97530 therapeutic activity, 97112 neuromuscular re-education, 97140 manual therapy, 97035 ultrasound, 97018 paraffin, 60454 moist heat, 97010 cryotherapy, 97032 electrical stimulation (manual), passive range of motion, balance training, functional mobility training, compression bandaging, visual/perceptual remediation/compensation, psychosocial skills training, energy conservation, coping strategies training, patient/family education, and DME and/or AE instructions  RECOMMENDED OTHER SERVICES: possible SLP  CONSULTED AND AGREED WITH PLAN OF CARE:  Patient  PLAN FOR NEXT SESSION:  Review and add to coordination HEP, review and add to UE strengthening HEP, further assess cognition and safety awareness, Review theraband exercises, quadruped and or WB through LUE in standing due to back and knee issues   Rosalio Loud, OTR/L 06/28/2023, 11:08 AM   [facility] [address], [address] [place], [place] [address] Phone # [phone] Fax # [phone]

## 2023-06-28 NOTE — Therapy (Signed)
OUTPATIENT PHYSICAL THERAPY NEURO TREATMENT   Patient Name: Joshua [name] MRN: [id number] DOB:11-26-1955, 68 y.o., male Today's Date: 07/03/2023   PCP: Kristian Covey, MD  REFERRING PROVIDER: Levert Feinstein, MD   END OF SESSION:  PT End of Session - 07/03/23 1226     Visit Number 4    Number of Visits 7    Date for PT Re-Evaluation 07/24/23    Authorization Type BCBS Medicare    PT Start Time 1147    PT Stop Time 1225    PT Time Calculation (min) 38 min    Activity Tolerance Patient tolerated treatment well    Behavior During Therapy [facility] for tasks assessed/performed                Past Medical History:  Diagnosis Date   Anal fistula    Hidradenitis    History of bladder cancer 2009   urologist-- [name]---  s/p TURBT 2010,  recurrence in office 2014 ,  none since per pt   History of colonic polyps    History of COVID-19 04/2019   per pt had covid pneumonia recovered at home, no oxygen, symptoms resolved   History of diverticulitis of colon    History of hypertension 04/05/2009   per the pt he lost 55 lbs and htn has been reversed   History of kidney stones    Hyperlipidemia 04/05/2009   pt states he does not take med for cholesterol   OA (osteoarthritis) 04/05/2009   Psoriatic arthritis (HCC)    rheumonotologist--- [name]   Type 2 diabetes mellitus (HCC) 04/05/2009   followed by pcp---  (01-31-2021  per pt only checks blood sugar once weekly, last fasting sugar-- 122)   Past Surgical History:  Procedure Laterality Date   COLONOSCOPY     last one 11-04-2020  by gessner   EXTRACORPOREAL SHOCK WAVE LITHOTRIPSY  2015   INCISION AND DRAINAGE ABSCESS N/A 02/03/2021   Procedure: EXCISION OF PERIANAL AND PERINEAL HYDRADENITIS, INTERROGATION OF PERIANAL WOUNDS;  Surgeon: [name], MD;  Location: [facility];  Service: General;  Laterality: N/A;   PARTIAL COLECTOMY N/A 02/26/2023   Procedure: EXPLORATORY LAPAROTOMY; PARTIAL  COLECTOMY; COLOSTOMY;  Surgeon: [name], MD;  Location: [facility];  Service: General;  Laterality: N/A;   RECTAL EXAM UNDER ANESTHESIA  02/03/2021   Procedure: ANORECTAL EXAM UNDER ANESTHESIA;  Surgeon: [name], MD;  Location: [facility];  Service: General;;   TONSILLECTOMY     child   TRANSURETHRAL RESECTION OF BLADDER TUMOR  02/2008   Patient Active Problem List   Diagnosis Date Noted   Cerebrovascular accident (CVA) (HCC) 06/07/2023   Protein-calorie malnutrition, severe 02/26/2023   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   Sigmoid diverticulitis 02/08/2023   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   Rectal bleeding 03/31/2020   Psoriatic arthritis (HCC) 02/21/2016   History of colonic polyp - sessile serrated polyp 03/12/2014   SHOULDER PAIN 01/05/2010   Malignant neoplasm of bladder (HCC) 04/05/2009   Type 2 diabetes mellitus (HCC) 04/05/2009   Hyperlipidemia 04/05/2009   Essential hypertension 04/05/2009   Osteoarthritis 04/05/2009    ONSET DATE: 05/23/23  REFERRING DIAG: I63.9 (ICD-10-CM) - Cerebrovascular accident (CVA), unspecified mechanism (HCC)  THERAPY DIAG:  Muscle weakness (generalized)  Unsteadiness on feet  Dizziness and giddiness  Other abnormalities of gait and mobility  Rationale for Evaluation and Treatment: Rehabilitation  SUBJECTIVE:  SUBJECTIVE STATEMENT: Haven't had his infusion yet- that's on Thursday. Having stiffness, but not pain. Got a [place] string but struggling with it.   Pt accompanied by: self  PERTINENT HISTORY: Bladder CA s/p surgery 2009, HTN, HLD, psoriatic arthritis, DMII, partial colectomy 2024  PAIN:  Are you having pain? No  PRECAUTIONS: Other: colostomy bag  RED FLAGS: None   WEIGHT BEARING RESTRICTIONS:  No  FALLS: Has patient fallen in last 6 months? 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 Talked patient through this activity and he was able to maintain singular focus on each bead   straight leg bridge on pball 15x  Cues for rhythmic breathing pattern   single leg bridge 2x10 each  limited ROM  clamshell red TB 15x each  Good form   Hooklying KTOS 30" Very stiff B but pt reports "that feels good"  Hooklying fig 4 30" Very stiff B but pt reports "that feels good"  rockerboard ant/pos and R/L Cueing to avoid trunk flexion to reduce excessive anterior wt shift; occasional CGA for stability   fwd/back stepping with 1 foot on rockerboard More difficulty with R LE stabilizing/L stepping        HOME EXERCISE PROGRAM Last updated: 06/26/23 Access Code: [email or web address] URL: [email or web address] Date: 07/03/2023 Prepared by: [facility] - Outpatient  Rehab - [facility]  Exercises - Sidelying Hip Abduction  - 1 x daily - 3 x weekly - 2 sets - 10 reps - Hamstring Curl with Weight Machine  - 1 x daily - 3 x weekly - 2 sets - 10 reps - Hip Adduction Machine  - 1 x daily - 3 x weekly - 2 sets - 10 reps - Hip Abduction Machine  - 1 x daily - 3 x weekly - 2 sets - 10 reps - Seated Vertical Smooth Pursuit  - 1 x daily - 5 x weekly - 2 sets - 10 reps - Side Stepping with Resistance at Ankles  - 1 x daily - 5 x weekly - 2 sets - 10 reps - Standing Tandem Balance with Counter Support  - 1 x daily - 5 x weekly - 2 sets - 30 sec hold - Supine Piriformis Stretch with Foot on Ground  - 1 x daily - 5 x weekly - 2 sets - 30 sec  hold - Supine Figure 4 Piriformis Stretch  - 1 x daily - 5 x weekly - 2 sets - 30 sec hold    PATIENT EDUCATION: Education details: HEP update, discussed plans for reassessment/goals check next session to assess benefit from PT Person educated: Patient Education method: Explanation, Demonstration, Tactile cues, Verbal cues, and Handouts Education comprehension: verbalized understanding and returned demonstration     Note: Objective measures were completed at Evaluation unless otherwise noted.  DIAGNOSTIC FINDINGS: 05/23/23 brain MRI: Subacute right MCA territory infarcts with petechial hemorrhage in the right frontal lobe. No mass effect or midline shift.  COGNITION: Overall cognitive status: Within functional limits for tasks assessed   SENSATION: Diminished L lateral hand, L arm, L HS numbness; intact to light touch    COORDINATION:  Alternating pronation/supination: slight bradykinesia Alternating toe tap: intact B Finger to nose: slight bradykinesia and dysmetria L Heel to shin: intact B   MUSCLE TONE: WNL B   POSTURE: rounded shoulders, FHP, slight trunk lean forward  LOWER EXTREMITY ROM:     Active  Right Eval Left Eval  Hip flexion    Hip extension    Hip abduction    Hip adduction    Hip internal rotation    Hip external rotation    Knee flexion    Knee extension    Ankle dorsiflexion 10 8  Ankle plantarflexion    Ankle inversion    Ankle eversion     (Blank rows = not tested)  LOWER EXTREMITY MMT:    MMT (in sitting) Right Eval Left Eval  Hip flexion 4+ 4-  Hip extension    Hip abduction 4 4  Hip adduction 4 4-  Hip internal rotation    Hip external rotation    Knee flexion 4 4  Knee extension 5 4+  Ankle dorsiflexion 5 5  Ankle plantarflexion 4+ 4+  Ankle inversion    Ankle eversion    (Blank rows = not tested)   GAIT: Gait pattern: reduced step length, flexed trunk, slowed Assistive device utilized: None Level of assistance:  Modified independence   FUNCTIONAL TESTS:        M-CTSIB  Condition 1: Firm Surface, EO 30 Sec, Normal Sway  Condition 2: Firm Surface, EC 30 Sec, Normal and Mild Sway  Condition 3: Foam Surface, EO 30 Sec, Mild Sway  Condition 4: Foam Surface, EC 20 Sec, Moderate and Severe Sway     FOTO: [phone],                                                                                                                               TREATMENT DATE: 06/12/23    PATIENT EDUCATION: Education details: prognosis, POC, HEP Person educated: Patient Education method: Explanation, Demonstration, Tactile cues, Verbal cues, and Handouts Education comprehension: verbalized understanding and returned demonstration  HOME EXERCISE PROGRAM: Access Code: [email or web address] URL: [email or web address] Date: 06/12/2023 Prepared by: [facility] - Outpatient  Rehab - [facility]  Exercises - Sidelying Hip Abduction  - 1 x daily - 3 x weekly - 2 sets - 10 reps - Tandem Walking with Counter Support  - 1 x daily - 3 x weekly - 2 sets - 5 reps - Hamstring Curl with Weight Machine  - 1 x daily - 3 x weekly - 2 sets - 10 reps - Hip Adduction Machine  - 1 x daily - 3 x weekly - 2 sets - 10 reps - Hip Abduction Machine  - 1 x daily - 3 x weekly - 2 sets - 10 reps  GOALS: Goals reviewed with patient? Yes  SHORT TERM GOALS: Target date: 07/03/2023  Patient to be independent with initial HEP. Baseline: HEP initiated Goal status: IN PROGRESS  LONG TERM GOALS: Target date: 07/24/2023  Patient to be independent with advanced HEP. Baseline: Not yet initiated  Goal status: IN PROGRESS  Patient to demonstrate B LE strength >/=4+/5.  Baseline: See above Goal status: IN PROGRESS  Patient to demonstrate B ankle dorsiflexion AROM at least 12 deg to improve gait efficiency.  Baseline: 10, 8 deg Goal status: IN PROGRESS  Patient to report return to gym regimen with modifications as needed.   Baseline: not yet returned Goal status: IN PROGRESS  Patient to demonstrate at least 25/30 on FGA in order to decrease risk of falls.  Baseline: 23 Goal status: IN PROGRESS  Patient to score at least 84 on FOTO in order to indicate improved functional outcomes.  Baseline: 54 Goal status: IN PROGRESS  ASSESSMENT:  CLINICAL IMPRESSION: Patient arrived to session with report of difficulty using Brock string at home. Reviewed use of Brock string to ensure understanding and tolerance. Patient reported success after review. Proceeded with progressive core strengthening activities which patient was able to perform with fairly good form. Hip stretching was initiated as patient is very stiff- he reports d/t hx of psoriatic arthritis. Compliant surface balance training focused on use of balance strategies. Patient tolerated session well and without complaints upon leaving.    OBJECTIVE IMPAIRMENTS: Abnormal gait, decreased activity tolerance, decreased balance, decreased coordination, difficulty walking, decreased ROM, decreased strength, dizziness, impaired flexibility, impaired sensation, and postural dysfunction.   ACTIVITY LIMITATIONS: carrying, lifting, bending, standing, squatting, stairs, transfers, bathing, toileting, dressing, reach over head, hygiene/grooming, and locomotion level  PARTICIPATION LIMITATIONS: meal prep, cleaning, laundry, driving, shopping, community activity, yard work, and church  PERSONAL FACTORS: Age, Fitness, Past/current experiences, Time since onset of injury/illness/exacerbation, and 3+ comorbidities: Bladder CA s/p surgery 2009, HTN, HLD, psoriatic arthritis, DMII, partial colectomy 2024  are also affecting patient's functional outcome.   REHAB POTENTIAL: Good  CLINICAL DECISION MAKING: Evolving/moderate complexity  EVALUATION COMPLEXITY: Moderate  PLAN:  PT FREQUENCY: 1x/week  PT DURATION: 6 weeks  PLANNED INTERVENTIONS: 97164- PT Re-evaluation,  97110-Therapeutic exercises, 97530- Therapeutic activity, 97112- Neuromuscular re-education, 97535- Self Care, 16109- Manual therapy, [phone]- Gait training, [phone]- Canalith repositioning, [id number]- Aquatic Therapy, 97014- Electrical stimulation (unattended), [phone]- Electrical stimulation (manual), Patient/Family education, Balance training, Stair training, Taping, Dry Needling, Joint mobilization, Spinal mobilization, Vestibular training, Cryotherapy, and Moist heat  PLAN FOR NEXT SESSION: check goals; convergence/brock string, hip strengthening, balance with EC and head turns       Baldemar Friday, [place], DPT 07/03/23 12:27 PM  [facility] [address] [place], [place] [address] Phone # [phone] Fax # [phone]

## 2023-06-29 ENCOUNTER — Ambulatory Visit (INDEPENDENT_AMBULATORY_CARE_PROVIDER_SITE_OTHER): Payer: Medicare Other

## 2023-06-29 ENCOUNTER — Ambulatory Visit (HOSPITAL_BASED_OUTPATIENT_CLINIC_OR_DEPARTMENT_OTHER): Payer: Medicare Other

## 2023-06-29 DIAGNOSIS — I639 Cerebral infarction, unspecified: Secondary | ICD-10-CM | POA: Diagnosis not present

## 2023-06-29 LAB — ECHOCARDIOGRAM COMPLETE
AR max vel: 2.77 cm2
AV Area VTI: 2.68 cm2
AV Area mean vel: 2.31 cm2
AV Mean grad: 3 mm[Hg]
AV Peak grad: 7.3 mm[Hg]
Ao pk vel: 1.35 m/s
Area-P 1/2: 3.42 cm2
S' Lateral: 2.5 cm

## 2023-06-29 MED ORDER — PERFLUTREN LIPID MICROSPHERE
1.0000 mL | INTRAVENOUS | Status: AC | PRN
  Administered 2023-06-29: 4 mL via INTRAVENOUS

## 2023-07-02 ENCOUNTER — Encounter: Payer: Self-pay | Admitting: Neurology

## 2023-07-03 ENCOUNTER — Ambulatory Visit: Payer: Medicare Other | Admitting: Occupational Therapy

## 2023-07-03 ENCOUNTER — Ambulatory Visit: Payer: Medicare Other | Admitting: Physical Therapy

## 2023-07-03 ENCOUNTER — Encounter: Payer: Self-pay | Admitting: Physical Therapy

## 2023-07-03 DIAGNOSIS — M1991 Primary osteoarthritis, unspecified site: Secondary | ICD-10-CM | POA: Diagnosis not present

## 2023-07-03 DIAGNOSIS — I639 Cerebral infarction, unspecified: Secondary | ICD-10-CM | POA: Diagnosis not present

## 2023-07-03 DIAGNOSIS — R2681 Unsteadiness on feet: Secondary | ICD-10-CM

## 2023-07-03 DIAGNOSIS — I69354 Hemiplegia and hemiparesis following cerebral infarction affecting left non-dominant side: Secondary | ICD-10-CM | POA: Diagnosis not present

## 2023-07-03 DIAGNOSIS — R42 Dizziness and giddiness: Secondary | ICD-10-CM | POA: Diagnosis not present

## 2023-07-03 DIAGNOSIS — L409 Psoriasis, unspecified: Secondary | ICD-10-CM | POA: Diagnosis not present

## 2023-07-03 DIAGNOSIS — L4050 Arthropathic psoriasis, unspecified: Secondary | ICD-10-CM | POA: Diagnosis not present

## 2023-07-03 DIAGNOSIS — R278 Other lack of coordination: Secondary | ICD-10-CM

## 2023-07-03 DIAGNOSIS — M6281 Muscle weakness (generalized): Secondary | ICD-10-CM | POA: Diagnosis not present

## 2023-07-03 DIAGNOSIS — L732 Hidradenitis suppurativa: Secondary | ICD-10-CM | POA: Diagnosis not present

## 2023-07-03 DIAGNOSIS — R208 Other disturbances of skin sensation: Secondary | ICD-10-CM | POA: Diagnosis not present

## 2023-07-03 DIAGNOSIS — R2689 Other abnormalities of gait and mobility: Secondary | ICD-10-CM

## 2023-07-03 NOTE — Therapy (Signed)
OUTPATIENT OCCUPATIONAL THERAPY NEURO  Treatment Note  Patient Name: Joshua [name] MRN: [id number] DOB:Jul 31, 1955, 68 y.o., male Today's Date: 07/03/2023  PCP: Joshua Covey, MD REFERRING PROVIDER: Levert Feinstein, MD  END OF SESSION:  OT End of Session - 07/03/23 1456     Visit Number 6    Number of Visits 13    Date for OT Re-Evaluation 07/27/23    Authorization Type BCBS Medicare    OT Start Time 1406    OT Stop Time 1446    OT Time Calculation (min) 40 min                  Past Medical History:  Diagnosis Date   Anal fistula    Hidradenitis    History of bladder cancer 2009   urologist-- dr Joshua [name]---  s/p TURBT 2010,  recurrence in office 2014 ,  none since per pt   History of colonic polyps    History of COVID-19 04/2019   per pt had covid pneumonia recovered at home, no oxygen, symptoms resolved   History of diverticulitis of colon    History of hypertension 04/05/2009   per the pt he lost 55 lbs and htn has been reversed   History of kidney stones    Hyperlipidemia 04/05/2009   pt states he does not take med for cholesterol   OA (osteoarthritis) 04/05/2009   Psoriatic arthritis (HCC)    rheumonotologist--- dr Joshua [name]   Type 2 diabetes mellitus (HCC) 04/05/2009   followed by pcp---  (01-31-2021  per pt only checks blood sugar once weekly, last fasting sugar-- 122)   Past Surgical History:  Procedure Laterality Date   COLONOSCOPY     last one 11-04-2020  by Joshua [name]   EXTRACORPOREAL SHOCK WAVE LITHOTRIPSY  2015   INCISION AND DRAINAGE ABSCESS N/A 02/03/2021   Procedure: EXCISION OF PERIANAL AND PERINEAL HYDRADENITIS, INTERROGATION OF PERIANAL WOUNDS;  Surgeon: Joshua Meuse, MD;  Location: [facility];  Service: General;  Laterality: N/A;   PARTIAL COLECTOMY N/A 02/26/2023   Procedure: EXPLORATORY LAPAROTOMY; PARTIAL COLECTOMY; COLOSTOMY;  Surgeon: Joshua Miyamoto, MD;  Location: [facility];  Service: General;  Laterality: N/A;    RECTAL EXAM UNDER ANESTHESIA  02/03/2021   Procedure: ANORECTAL EXAM UNDER ANESTHESIA;  Surgeon: Joshua Meuse, MD;  Location: [facility];  Service: General;;   TONSILLECTOMY     child   TRANSURETHRAL RESECTION OF BLADDER TUMOR  02/2008   Patient Active Problem List   Diagnosis Date Noted   Cerebrovascular accident (CVA) (HCC) 06/07/2023   Protein-calorie malnutrition, severe 02/26/2023   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   Sigmoid diverticulitis 02/08/2023   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   Rectal bleeding 03/31/2020   Psoriatic arthritis (HCC) 02/21/2016   History of colonic polyp - sessile serrated polyp 03/12/2014   SHOULDER PAIN 01/05/2010   Malignant neoplasm of bladder (HCC) 04/05/2009   Type 2 diabetes mellitus (HCC) 04/05/2009   Hyperlipidemia 04/05/2009   Essential hypertension 04/05/2009   Osteoarthritis 04/05/2009    ONSET DATE: R MCA stroke in Dec 2024 (referral date 06/07/23)  REFERRING DIAG: I63.9 (ICD-10-CM) - Cerebrovascular accident (CVA), unspecified mechanism  THERAPY DIAG:  Muscle weakness (generalized)  Other disturbances of skin sensation  Hemiplegia and hemiparesis following cerebral infarction affecting left non-dominant side (HCC)  Other lack of coordination  Rationale for Evaluation and Treatment: Rehabilitation  SUBJECTIVE:   SUBJECTIVE STATEMENT: Pt reports purchasing a hand gripper and has it set to  90# and is focusing on full grasp.   Pt accompanied by: self (significant other dropped pt off - [name])  PERTINENT HISTORY: Bladder CA s/p surgery 2009, HTN, HLD, psoriatic arthritis, DMII, partial colectomy 2024   PRECAUTIONS: Other: colostomy bag  WEIGHT BEARING RESTRICTIONS: No  PAIN:  Are you having pain? No  FALLS: Has patient fallen in last 6 months? No  LIVING ENVIRONMENT: Lives with: lives with their spouse Lives in: House/apartment Stairs: Yes: Internal: full flight of steps to 2nd floor;  bedroom and bathroom on 2nd floor steps; on left going up and External: 1 step in through the garage steps;   Has following equipment at home: None; seat in the shower but more to hold items   PLOF: Independent, Independent with basic ADLs, and Leisure: loves golfing  PATIENT GOALS: I want to feel back to normal  OBJECTIVE:  Note: Objective measures were completed at Evaluation unless otherwise noted.  HAND DOMINANCE: Right  ADLs: Transfers/ambulation related to ADLs: Little bit of challenge with balance, wife reports he walks different Eating: increased focus/effort when cutting foods Grooming: increased difficulty with coordination/strength when using L hand when shaving UB Dressing: increased difficulty with buttoning shirt LB Dressing: Mod I Toileting: Mod I Bathing: Mod I Tub Shower transfers: Supervision when bathing, walk-in shower Equipment: Walk in shower  IADLs:  Light housekeeping: wife completing majority, he will wash dishes occasionally and start the washing machine Meal Prep: helps spouse with aspects of meal prep, he will also make breakfast  Community mobility: wife does not want pt driving at this time, but no physician has told him not to drive Medication management: Mod I, utilizing pill box which he fills independently Financial management: shared with wife  MOBILITY STATUS:  reports that spouse states he walks differently now  POSTURE COMMENTS:  rounded shoulders, forward head, and slight forward trunk lean  ACTIVITY TOLERANCE: Activity tolerance: WFL for tasks assessed on eval  FUNCTIONAL OUTCOME MEASURES: FOTO: 67, predicted 75 in 16 visits  UPPER EXTREMITY ROM:  L shoulder droop compared to R  Active ROM Right eval Left eval  Shoulder flexion 128 113  Shoulder abduction    Shoulder adduction    Shoulder extension    Shoulder internal rotation    Shoulder external rotation    Elbow flexion    Elbow extension    Wrist flexion    Wrist  extension    Wrist ulnar deviation    Wrist radial deviation    Wrist pronation    Wrist supination    (Blank rows = not tested)  UPPER EXTREMITY MMT:     MMT Right eval Left eval  Shoulder flexion 4+ 4-  Shoulder abduction    Shoulder adduction    Shoulder extension    Shoulder internal rotation    Shoulder external rotation    Middle trapezius    Lower trapezius    Elbow flexion    Elbow extension    Wrist flexion    Wrist extension    Wrist ulnar deviation    Wrist radial deviation    Wrist pronation    Wrist supination    (Blank rows = not tested)  HAND FUNCTION: Grip strength: Right: 82 lbs; Left: 65 lbs  COORDINATION: 9 Hole Peg test: Right: 28.72 sec; Left: 34.03 sec Box and Blocks:  Right 58 blocks, Left 47blocks (more gross grasp with LUE)  SENSATION: Light touch: Impaired ; reports numbness in thumb, index, and long finger on L hand  MUSCLE TONE: WNL bilaterally  COGNITION: Overall cognitive status: No family/caregiver present to determine baseline cognitive functioning; reports some difficulty with comprehension and attention with reading.  VISION: Subjective report: sometimes I have to "refocus" and notices intermittent dizziness Baseline vision: Wears glasses for reading only  VISION ASSESSMENT: To be further assessed in functional context  Patient has difficulty with following activities due to following visual impairments: driving, reading, reports some difficulty with comprehension and attention with reading.  PRAXIS: Impaired: Motor planning  OBSERVATIONS: OT questions cognition and awareness of deficits.  Pt reports difficulty with driving and that his spouse currently does not want him driving.                                                                                                                             TREATMENT DATE:  07/03/23 Grip strength: Pt reports purchasing grip strengthener.  OT educated on attempting to hold  sustained hold 3-5 seconds before releasing for sustained hold.  Grip strength assessment: L 75 and 76# on dynamometer.  Box and blocks: LUE 44 blocks, completed a 2nd trial completing 49 blocks.  Increased difficulty when blocks are right next to each other or the wall of box, due to impaired sensation. 9 hole peg test: L: 36.62 sec.  Pt still demonstrating mild impairment with coordination due to impaired sensation. Coordination: engaged in picking up coins with LUE one at a time to place into coin slot, picking up 5-10 coins 1 at a time and translating palm to fingertips to place into coin slot, and bimanual task of tearing paper and then rolling into a ball with just L hand. Shoulder strengthening: engaged in strengthening with red theraband with shoulder extension, scapular retraction, diagonal chop, internal rotation, and external rotation from anchored position x10. Engaged in plank on wall with resistance band, slowly reaching L arm diagonally upward, then return to the plank position and repeat straight out to the side, then diagonally downward.  OT providing cues and education on modification of length of theraband to increase and decrease resistance. OT educating on focus on quality of movement.    06/28/23 Hand Gripper: with LUE on level 70#.Marland Kitchen Pt picked up 1 inch blocks with gripper with 4 drops and min difficulty as placing into container on elevated surface to further challenge sustained grasp with movement.  OT removed elevated surface with mild improvements in sustained grasp, however still fatiguing as task continued.  Pt agreeable to attempting increased resistance to 75# with good success and no drops.  OT then adding elevated surface back to task with pt demonstrating good ability to complete with 3 drops.  OT recommending continued use of green theraputty for grip strength as well as recommending purchase of grip strengthener. Box and blocks: LUE 45 blocks.  Pt reports improved quality,  just fatigued from above tasks.  OT noticed pt still with decreased integration of index finger when picking up blocks.   Grooved Pegs: with  LUE for increased coordination and integration of index finger into task. Pt placed pegs with one at a time and removed with in hand manipulation holding up to 5 at a time in hand. Pt completed with reports of mild-mod difficulty and 2 drops when placing pegs and 1 drop when removing. Impaired sensation: OT educating on visual attention to UE during reaching and with managing items that are hot or sharp.  OT educating on homunculus image with focus on area of stroke and motor and sensory impairment due to location of stroke.      06/26/23 UE AROM: engaged in tapping numbers in clock pattern with LUE as OT calling out numbers in spontaneous order to challenge direction changing and AROM.  Pt requiring min increased time to scan numbers.  Engaged in wall slides, completing shoulder flexion x10 and shoulder abduction x10 with focus on sustained hold at end range ~3 seconds. Gross motor Coordination: engaged in moving rings from cone to cone with LUE while focusing on motor control, trying not to hit the cone stack - incorporating shoulder flexion and horizontal abduction/adduction.  Pt reports noticing mild weakness in shoulder at highest level.   Putty: engaged in grip and pinch strengthening with green theraputty with focus on full hand grasp, tip to tip, 3 point pinch, and key pinch x10 with LUE.  Pt completing pinch and pull as well as removal of stones from putty with focus on pinch strengthening and coordination when removing pegs.      PATIENT EDUCATION: Education details: ongoing condition specific education, with focus on [facility] and strengthening Person educated: Patient Education method: Explanation, Demonstration, and Handouts Education comprehension: verbalized understanding and needs further education  HOME EXERCISE PROGRAM: Access Code: [email or web address] URL:  [email or web address] Date: 07/03/2023 (updated) Prepared by: [facility] - Outpatient  Rehab - [facility]  Program Notes Focus on quality of the movements.  Feel free to do less reps but more sets if that increases the quality.  Exercises - Seated Shoulder Shrugs  - 3 x weekly - 1-2 sets - 10 reps - 3-5 sec hold - Seated Scapular Retraction  - 3 x weekly - 2 sets - 10 reps - 3-5 sec hold - Shoulder Flexion with Resistance  - 3 x weekly - 2 sets - 10 reps - Seated Shoulder Extension with Resistance  - 3 x weekly - 2 sets - 10 reps - Scapular Retraction with Resistance  - 3 x weekly - 2 sets - 10 reps - Shoulder Internal Rotation with Resistance  - 3 x weekly - 2 sets - 10 reps - Shoulder External Rotation with Anchored Resistance  - 3 x weekly - 2 sets - 10 reps - Standing Diagonal Chop  - 3 x weekly - 2 sets - 10 reps - Shoulder extension with resistance - Neutral  - 3 x weekly - 2 sets - 10 reps - Standing Plank on Wall with Reaches and Resistance  - 3 x weekly - 2 sets - 10 reps  Access Code: [email or web address] URL: [email or web address] Date: 06/26/2023 Prepared by: [facility] - Outpatient  Rehab - [facility]  Exercises - Putty Squeezes  - 2 x daily - 10 reps - Rolling Putty on Table  - 2 x daily - Tip Pinch with Putty  - 2 x daily - 10 reps - Key Pinch with Putty  - 2 x daily - 5 reps - Finger Lumbricals with Putty  - 2 x daily - 10 reps - Finger Pinch and  Pull with Putty  - 2 x daily - 10 reps - Removing Marbles from Putty  - 2 x daily - 10 reps  GOALS: Goals reviewed with patient? Yes  SHORT TERM GOALS: Target date: 07/06/23  Pt will be independent with coordination HEP. Baseline: Goal status: MET - 07/03/23  2.  Pt will be independent with L shoulder/UE strengthening HEP. Baseline:  Goal status: MET - 07/03/23  3.  Pt will verbalize understanding of strategies to compensate for impaired sensation.  Baseline:  Goal status: MET -  07/03/23  4.  Pt will demonstrate improved UE functional use for ADLs as evidenced by increasing box/ blocks score by 4 blocks with LUE. Baseline: Right 58 blocks, Left 47blocks  Goal status: NOT MET - L: 49 blocks   LONG TERM GOALS: Target date: 07/27/23  Pt will demonstrate ability to retrieve a moderate weight object at 125* shoulder flexion to demonstrated increased postural control, shoulder flexion, and UE strength as needed to complete ADLs and IADLs. Baseline: 113* Goal status: IN PROGRESS  2.  Pt will demonstrate improved fine motor coordination for ADLs as evidenced by decreasing 9 hole peg test score for LUE by 4 secs. Baseline: Right: 28.72 sec; Left: 34.03 sec Goal status: IN PROGRESS  3.  Pt will improved L hand grip strength by 6# to demonstrate increased sustained grasp as needed for ADLs and IADLs  Baseline: Right: 82 lbs; Left: 65 lbs Goal status: MET - 75.5# on 07/03/23  4.  Pt will demonstrate ability to sequence simple functional task (simple snack prep, laundry task, etc) at Mod I level with good safety awareness Baseline:  Goal status: IN PROGRESS  5.  Pt will navigate a moderately busy environment, visually attending to surroundings and obstacles, while following multi-step commands with 90% accuracy. Baseline:  Goal status: IN PROGRESS  6.  Pt will verbalize understanding of safe return to driving recommendations Baseline:  Goal status: IN PROGRESS  ASSESSMENT:  CLINICAL IMPRESSION: Pt tolerating all tasks with good endurance and understanding of rationale.  Pt demonstrating good LUE strength with use of red theraband, tolerating increased challenge with wall planks with resistance and higher anchored position for increased challenge. Pt demonstrating difficulty with increased shoulder flexion in wall plank position but with good technique and understanding.  PERFORMANCE DEFICITS: in functional skills including ADLs, IADLs, coordination, sensation, ROM,  strength, pain, Fine motor control, Gross motor control, balance, body mechanics, endurance, decreased knowledge of precautions, decreased knowledge of use of DME, vision, and UE functional use, cognitive skills including safety awareness, sequencing, and understand, and psychosocial skills including coping strategies, environmental adaptation, and routines and behaviors.     PLAN:  OT FREQUENCY: 2x/week  OT DURATION: 6 weeks  PLANNED INTERVENTIONS: 97168 OT Re-evaluation, 97535 self care/ADL training, 16109 therapeutic exercise, 97530 therapeutic activity, 97112 neuromuscular re-education, 97140 manual therapy, 97035 ultrasound, 97018 paraffin, 60454 moist heat, 97010 cryotherapy, 97032 electrical stimulation (manual), passive range of motion, balance training, functional mobility training, compression bandaging, visual/perceptual remediation/compensation, psychosocial skills training, energy conservation, coping strategies training, patient/family education, and DME and/or AE instructions  RECOMMENDED OTHER SERVICES: possible SLP  CONSULTED AND AGREED WITH PLAN OF CARE: Patient  PLAN FOR NEXT SESSION:  Review and add to coordination HEP, review and increase to green theraband with UE strengthening HEP, further assess cognition and safety awareness, Review theraband exercises, quadruped and or WB through LUE in standing due to back and knee issues   Asani Mcburney, OTR/L 07/03/2023, 2:56 PM  [facility] at [facility] [address] [place], [address] [place], [place] [address] Phone # [phone] Fax # [phone]

## 2023-07-05 ENCOUNTER — Ambulatory Visit: Payer: Medicare Other | Admitting: Occupational Therapy

## 2023-07-05 DIAGNOSIS — L4050 Arthropathic psoriasis, unspecified: Secondary | ICD-10-CM | POA: Diagnosis not present

## 2023-07-05 DIAGNOSIS — R208 Other disturbances of skin sensation: Secondary | ICD-10-CM

## 2023-07-05 DIAGNOSIS — I69354 Hemiplegia and hemiparesis following cerebral infarction affecting left non-dominant side: Secondary | ICD-10-CM

## 2023-07-05 DIAGNOSIS — R42 Dizziness and giddiness: Secondary | ICD-10-CM | POA: Diagnosis not present

## 2023-07-05 DIAGNOSIS — R278 Other lack of coordination: Secondary | ICD-10-CM | POA: Diagnosis not present

## 2023-07-05 DIAGNOSIS — M6281 Muscle weakness (generalized): Secondary | ICD-10-CM | POA: Diagnosis not present

## 2023-07-05 DIAGNOSIS — R2681 Unsteadiness on feet: Secondary | ICD-10-CM | POA: Diagnosis not present

## 2023-07-05 DIAGNOSIS — I639 Cerebral infarction, unspecified: Secondary | ICD-10-CM | POA: Diagnosis not present

## 2023-07-05 DIAGNOSIS — R2689 Other abnormalities of gait and mobility: Secondary | ICD-10-CM | POA: Diagnosis not present

## 2023-07-05 NOTE — Therapy (Signed)
OUTPATIENT OCCUPATIONAL THERAPY NEURO  Treatment Note  Patient Name: Joshua [name] MRN: [id number] DOB:31-Oct-1955, 68 y.o., male Today's Date: 07/05/2023  PCP: Kristian Covey, MD REFERRING PROVIDER: Levert Feinstein, MD  END OF SESSION:  OT End of Session - 07/05/23 1413     Visit Number 7    Number of Visits 13    Date for OT Re-Evaluation 07/27/23    Authorization Type BCBS Medicare    OT Start Time 1358    OT Stop Time 1440    OT Time Calculation (min) 42 min                   Past Medical History:  Diagnosis Date   Anal fistula    Hidradenitis    History of bladder cancer 2009   urologist-- [name]---  s/p TURBT 2010,  recurrence in office 2014 ,  none since per pt   History of colonic polyps    History of COVID-19 04/2019   per pt had covid pneumonia recovered at home, no oxygen, symptoms resolved   History of diverticulitis of colon    History of hypertension 04/05/2009   per the pt he lost 55 lbs and htn has been reversed   History of kidney stones    Hyperlipidemia 04/05/2009   pt states he does not take med for cholesterol   OA (osteoarthritis) 04/05/2009   Psoriatic arthritis (HCC)    rheumonotologist--- [name]   Type 2 diabetes mellitus (HCC) 04/05/2009   followed by pcp---  (01-31-2021  per pt only checks blood sugar once weekly, last fasting sugar-- 122)   Past Surgical History:  Procedure Laterality Date   COLONOSCOPY     last one 11-04-2020  by gessner   EXTRACORPOREAL SHOCK WAVE LITHOTRIPSY  2015   INCISION AND DRAINAGE ABSCESS N/A 02/03/2021   Procedure: EXCISION OF PERIANAL AND PERINEAL HYDRADENITIS, INTERROGATION OF PERIANAL WOUNDS;  Surgeon: [name], MD;  Location: [facility];  Service: General;  Laterality: N/A;   PARTIAL COLECTOMY N/A 02/26/2023   Procedure: EXPLORATORY LAPAROTOMY; PARTIAL COLECTOMY; COLOSTOMY;  Surgeon: [name], MD;  Location: [facility];  Service: General;  Laterality: N/A;    RECTAL EXAM UNDER ANESTHESIA  02/03/2021   Procedure: ANORECTAL EXAM UNDER ANESTHESIA;  Surgeon: [name], MD;  Location: [facility];  Service: General;;   TONSILLECTOMY     child   TRANSURETHRAL RESECTION OF BLADDER TUMOR  02/2008   Patient Active Problem List   Diagnosis Date Noted   Cerebrovascular accident (CVA) (HCC) 06/07/2023   Protein-calorie malnutrition, severe 02/26/2023   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   Sigmoid diverticulitis 02/08/2023   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   Rectal bleeding 03/31/2020   Psoriatic arthritis (HCC) 02/21/2016   History of colonic polyp - sessile serrated polyp 03/12/2014   SHOULDER PAIN 01/05/2010   Malignant neoplasm of bladder (HCC) 04/05/2009   Type 2 diabetes mellitus (HCC) 04/05/2009   Hyperlipidemia 04/05/2009   Essential hypertension 04/05/2009   Osteoarthritis 04/05/2009    ONSET DATE: R MCA stroke in Dec 2024 (referral date 06/07/23)  REFERRING DIAG: I63.9 (ICD-10-CM) - Cerebrovascular accident (CVA), unspecified mechanism  THERAPY DIAG:  Muscle weakness (generalized)  Other disturbances of skin sensation  Hemiplegia and hemiparesis following cerebral infarction affecting left non-dominant side (HCC)  Other lack of coordination  Rationale for Evaluation and Treatment: Rehabilitation  SUBJECTIVE:   SUBJECTIVE STATEMENT: Pt reports having his infusion this morning, and should be  feeling better by tomorrow.  Pt accompanied by: self (significant other dropped pt off - wife [name])  PERTINENT HISTORY: Bladder CA s/p surgery 2009, HTN, HLD, psoriatic arthritis, DMII, partial colectomy 2024   PRECAUTIONS: Other: colostomy bag  WEIGHT BEARING RESTRICTIONS: No  PAIN:  Are you having pain? No  FALLS: Has patient fallen in last 6 months? No  LIVING ENVIRONMENT: Lives with: lives with their spouse Lives in: House/apartment Stairs: Yes: Internal: full flight of steps to 2nd floor;  bedroom and bathroom on 2nd floor steps; on left going up and External: 1 step in through the garage steps;   Has following equipment at home: None; seat in the shower but more to hold items   PLOF: Independent, Independent with basic ADLs, and Leisure: loves golfing  PATIENT GOALS: I want to feel back to normal  OBJECTIVE:  Note: Objective measures were completed at Evaluation unless otherwise noted.  HAND DOMINANCE: Right  ADLs: Transfers/ambulation related to ADLs: Little bit of challenge with balance, wife reports he walks different Eating: increased focus/effort when cutting foods Grooming: increased difficulty with coordination/strength when using L hand when shaving UB Dressing: increased difficulty with buttoning shirt LB Dressing: Mod I Toileting: Mod I Bathing: Mod I Tub Shower transfers: Supervision when bathing, walk-in shower Equipment: Walk in shower  IADLs:  Light housekeeping: wife completing majority, he will wash dishes occasionally and start the washing machine Meal Prep: helps spouse with aspects of meal prep, he will also make breakfast  Community mobility: wife does not want pt driving at this time, but no physician has told him not to drive Medication management: Mod I, utilizing pill box which he fills independently Financial management: shared with wife  MOBILITY STATUS:  reports that spouse states he walks differently now  POSTURE COMMENTS:  rounded shoulders, forward head, and slight forward trunk lean  ACTIVITY TOLERANCE: Activity tolerance: WFL for tasks assessed on eval  FUNCTIONAL OUTCOME MEASURES: FOTO: 67, predicted 75 in 16 visits  UPPER EXTREMITY ROM:  L shoulder droop compared to R  Active ROM Right eval Left eval  Shoulder flexion 128 113  Shoulder abduction    Shoulder adduction    Shoulder extension    Shoulder internal rotation    Shoulder external rotation    Elbow flexion    Elbow extension    Wrist flexion    Wrist  extension    Wrist ulnar deviation    Wrist radial deviation    Wrist pronation    Wrist supination    (Blank rows = not tested)  UPPER EXTREMITY MMT:     MMT Right eval Left eval  Shoulder flexion 4+ 4-  Shoulder abduction    Shoulder adduction    Shoulder extension    Shoulder internal rotation    Shoulder external rotation    Middle trapezius    Lower trapezius    Elbow flexion    Elbow extension    Wrist flexion    Wrist extension    Wrist ulnar deviation    Wrist radial deviation    Wrist pronation    Wrist supination    (Blank rows = not tested)  HAND FUNCTION: Grip strength: Right: 82 lbs; Left: 65 lbs  COORDINATION: 9 Hole Peg test: Right: 28.72 sec; Left: 34.03 sec Box and Blocks:  Right 58 blocks, Left 47blocks (more gross grasp with LUE)  SENSATION: Light touch: Impaired ; reports numbness in thumb, index, and long finger on L hand  MUSCLE TONE: WNL  bilaterally  COGNITION: Overall cognitive status: No family/caregiver present to determine baseline cognitive functioning; reports some difficulty with comprehension and attention with reading.  VISION: Subjective report: sometimes I have to "refocus" and notices intermittent dizziness Baseline vision: Wears glasses for reading only  VISION ASSESSMENT: To be further assessed in functional context  Patient has difficulty with following activities due to following visual impairments: driving, reading, reports some difficulty with comprehension and attention with reading.  PRAXIS: Impaired: Motor planning  OBSERVATIONS: OT questions cognition and awareness of deficits.  Pt reports difficulty with driving and that his spouse currently does not want him driving.                                                                                                                             TREATMENT DATE:  07/05/23 Therapeutic activities:  Pegs: engaged in small peg board pattern replication with focus on  coordination with picking up and manipulating small pegs into peg board.  OT increased challenge to attempting translation of pegs from palm to finger tips to place into peg board.  Pt with ability to translate from finger tips to palm, but increased difficulty with translating from palm back to finger tips.  Removed pegs one at a time until 10-15 in palm and then placing into container, pt with increased ease translating finger tips to palm. Jenga: stacking jenga blocks in various patterns with focus on motor control and shoulder flexion/reach with good control. Pipe tree puzzle: Engaged in pipe tree puzzle in sitting with focus on increased shoulder flexion/reach and coordination and manipulation of pieces, and visual scanning and problem solving.  Pt demonstrating good functional use of LU when picking up and manipulating pieces.   Gross motor control: placing golf balls onto cones with use of tongs in L hand to challenge gross motor control and incorporation of shoulder flexion/reach.  Completed in horizontal pattern followed by vertical pattern to increase functional reach.  Pt dropping 15% of the time.  Tossing/catching ball with LUE with focus on motor control and grading pressure and speed to facilitate increased ease with catching.   Coordination: engaged in rotating 2 golf balls hand, pt with improved control when rotating balls counter-clockwise in L hand compared to previous session.  Pt attempting clockwise direction as well, still with challenge compared to counter clockwise direction.      07/03/23 Grip strength: Pt reports purchasing grip strengthener.  OT educated on attempting to hold sustained hold 3-5 seconds before releasing for sustained hold.  Grip strength assessment: L 75 and 76# on dynamometer.  Box and blocks: LUE 44 blocks, completed a 2nd trial completing 49 blocks.  Increased difficulty when blocks are right next to each other or the wall of box, due to impaired sensation. 9  hole peg test: L: 36.62 sec.  Pt still demonstrating mild impairment with coordination due to impaired sensation. Coordination: engaged in picking up coins with LUE one at a time to  place into coin slot, picking up 5-10 coins 1 at a time and translating palm to fingertips to place into coin slot, and bimanual task of tearing paper and then rolling into a ball with just L hand. Shoulder strengthening: engaged in strengthening with red theraband with shoulder extension, scapular retraction, diagonal chop, internal rotation, and external rotation from anchored position x10. Engaged in plank on wall with resistance band, slowly reaching L arm diagonally upward, then return to the plank position and repeat straight out to the side, then diagonally downward.  OT providing cues and education on modification of length of theraband to increase and decrease resistance. OT educating on focus on quality of movement.    06/28/23 Hand Gripper: with LUE on level 70#.Marland Kitchen Pt picked up 1 inch blocks with gripper with 4 drops and min difficulty as placing into container on elevated surface to further challenge sustained grasp with movement.  OT removed elevated surface with mild improvements in sustained grasp, however still fatiguing as task continued.  Pt agreeable to attempting increased resistance to 75# with good success and no drops.  OT then adding elevated surface back to task with pt demonstrating good ability to complete with 3 drops.  OT recommending continued use of green theraputty for grip strength as well as recommending purchase of grip strengthener. Box and blocks: LUE 45 blocks.  Pt reports improved quality, just fatigued from above tasks.  OT noticed pt still with decreased integration of index finger when picking up blocks.   Grooved Pegs: with LUE for increased coordination and integration of index finger into task. Pt placed pegs with one at a time and removed with in hand manipulation holding up to 5 at a  time in hand. Pt completed with reports of mild-mod difficulty and 2 drops when placing pegs and 1 drop when removing. Impaired sensation: OT educating on visual attention to UE during reaching and with managing items that are hot or sharp.  OT educating on homunculus image with focus on area of stroke and motor and sensory impairment due to location of stroke.      PATIENT EDUCATION: Education details: ongoing condition specific education, with focus on [facility] and strengthening Person educated: Patient Education method: Explanation, Demonstration, and Handouts Education comprehension: verbalized understanding and needs further education  HOME EXERCISE PROGRAM: Access Code: [email or web address] URL: [email or web address] Date: 07/03/2023 (updated) Prepared by: [facility] - Outpatient  Rehab - [facility]  Program Notes Focus on quality of the movements.  Feel free to do less reps but more sets if that increases the quality.  Exercises - Seated Shoulder Shrugs  - 3 x weekly - 1-2 sets - 10 reps - 3-5 sec hold - Seated Scapular Retraction  - 3 x weekly - 2 sets - 10 reps - 3-5 sec hold - Shoulder Flexion with Resistance  - 3 x weekly - 2 sets - 10 reps - Seated Shoulder Extension with Resistance  - 3 x weekly - 2 sets - 10 reps - Scapular Retraction with Resistance  - 3 x weekly - 2 sets - 10 reps - Shoulder Internal Rotation with Resistance  - 3 x weekly - 2 sets - 10 reps - Shoulder External Rotation with Anchored Resistance  - 3 x weekly - 2 sets - 10 reps - Standing Diagonal Chop  - 3 x weekly - 2 sets - 10 reps - Shoulder extension with resistance - Neutral  - 3 x weekly - 2 sets - 10 reps - Standing Plank  on Wall with Reaches and Resistance  - 3 x weekly - 2 sets - 10 reps  Access Code: [email or web address] URL: [email or web address] Date: 06/26/2023 Prepared by: [facility] - Outpatient  Rehab - [facility]  Exercises - Putty Squeezes  - 2 x daily - 10 reps -  Rolling Putty on Table  - 2 x daily - Tip Pinch with Putty  - 2 x daily - 10 reps - Key Pinch with Putty  - 2 x daily - 5 reps - Finger Lumbricals with Putty  - 2 x daily - 10 reps - Finger Pinch and Pull with Putty  - 2 x daily - 10 reps - Removing Marbles from Putty  - 2 x daily - 10 reps  GOALS: Goals reviewed with patient? Yes  SHORT TERM GOALS: Target date: 07/06/23  Pt will be independent with coordination HEP. Baseline: Goal status: MET - 07/03/23  2.  Pt will be independent with L shoulder/UE strengthening HEP. Baseline:  Goal status: MET - 07/03/23  3.  Pt will verbalize understanding of strategies to compensate for impaired sensation.  Baseline:  Goal status: MET - 07/03/23  4.  Pt will demonstrate improved UE functional use for ADLs as evidenced by increasing box/ blocks score by 4 blocks with LUE. Baseline: Right 58 blocks, Left 47blocks  Goal status: NOT MET - L: 49 blocks   LONG TERM GOALS: Target date: 07/27/23  Pt will demonstrate ability to retrieve a moderate weight object at 125* shoulder flexion to demonstrated increased postural control, shoulder flexion, and UE strength as needed to complete ADLs and IADLs. Baseline: 113* Goal status: IN PROGRESS  2.  Pt will demonstrate improved fine motor coordination for ADLs as evidenced by decreasing 9 hole peg test score for LUE by 4 secs. Baseline: Right: 28.72 sec; Left: 34.03 sec Goal status: IN PROGRESS  3.  Pt will improved L hand grip strength by 6# to demonstrate increased sustained grasp as needed for ADLs and IADLs  Baseline: Right: 82 lbs; Left: 65 lbs Goal status: MET - 75.5# on 07/03/23  4.  Pt will demonstrate ability to sequence simple functional task (simple snack prep, laundry task, etc) at Mod I level with good safety awareness Baseline:  Goal status: IN PROGRESS  5.  Pt will navigate a moderately busy environment, visually attending to surroundings and obstacles, while following multi-step  commands with 90% accuracy. Baseline:  Goal status: IN PROGRESS  6.  Pt will verbalize understanding of safe return to driving recommendations Baseline:  Goal status: IN PROGRESS  ASSESSMENT:  CLINICAL IMPRESSION: Pt tolerating all tasks with good endurance and understanding of rationale.  Pt demonstrating improved coordination with rotation of golf balls in hand as well as translation of pegs from fingers <> palm.  Pt not limited by shoulder during reaching with therapeutic tasks this session.  Pt demonstrating some difficulty with gross motor control when stacking golf balls onto cones and when tossing/catching golf ball in L hand.  PERFORMANCE DEFICITS: in functional skills including ADLs, IADLs, coordination, sensation, ROM, strength, pain, Fine motor control, Gross motor control, balance, body mechanics, endurance, decreased knowledge of precautions, decreased knowledge of use of DME, vision, and UE functional use, cognitive skills including safety awareness, sequencing, and understand, and psychosocial skills including coping strategies, environmental adaptation, and routines and behaviors.     PLAN:  OT FREQUENCY: 2x/week  OT DURATION: 6 weeks  PLANNED INTERVENTIONS: 97168 OT Re-evaluation, 97535 self care/ADL training, 16109 therapeutic exercise, 97530 therapeutic activity,  53664 neuromuscular re-education, 97140 manual therapy, 97035 ultrasound, 40347 paraffin, 97010 moist heat, 97010 cryotherapy, 97032 electrical stimulation (manual), passive range of motion, balance training, functional mobility training, compression bandaging, visual/perceptual remediation/compensation, psychosocial skills training, energy conservation, coping strategies training, patient/family education, and DME and/or AE instructions  RECOMMENDED OTHER SERVICES: possible SLP  CONSULTED AND AGREED WITH PLAN OF CARE: Patient  PLAN FOR NEXT SESSION:  Review and add to coordination HEP, review and increase to  green theraband with UE strengthening HEP, further assess cognition and safety awareness, Review theraband exercises, quadruped and or WB through LUE in standing due to back and knee issues   Rosalio Loud, OTR/L 07/05/2023, 2:22 PM   [facility] at [facility] [address] [place], [place] [address] Phone # [phone] Fax # [phone]

## 2023-07-06 ENCOUNTER — Encounter: Payer: Medicare Other | Admitting: Internal Medicine

## 2023-07-09 ENCOUNTER — Telehealth: Payer: Self-pay | Admitting: Neurology

## 2023-07-09 DIAGNOSIS — Z933 Colostomy status: Secondary | ICD-10-CM | POA: Diagnosis not present

## 2023-07-09 DIAGNOSIS — K5732 Diverticulitis of large intestine without perforation or abscess without bleeding: Secondary | ICD-10-CM | POA: Diagnosis not present

## 2023-07-09 NOTE — Therapy (Signed)
 OUTPATIENT PHYSICAL THERAPY NEURO DISCHARGE   Patient Name: Joshua [name] MRN: [id number] DOB:1956/02/02, 68 y.o., male Today's Date: 07/10/2023   PCP: Micheal Wolm ORN, MD  REFERRING PROVIDER: Onita Duos, MD   Progress Note Reporting Period 06/12/23 to 07/11/23  See note below for Objective Data and Assessment of Progress/Goals.     END OF SESSION:  PT End of Session - 07/10/23 1528     Visit Number 5    Number of Visits 7    Date for PT Re-Evaluation 07/24/23    Authorization Type BCBS Medicare    PT Start Time 1448    PT Stop Time 1526    PT Time Calculation (min) 38 min    Activity Tolerance Patient tolerated treatment well    Behavior During Therapy WFL for tasks assessed/performed                 Past Medical History:  Diagnosis Date   Anal fistula    Hidradenitis    History of bladder cancer 2009   urologist-- dr rosalind---  s/p TURBT 2010,  recurrence in office 2014 ,  none since per pt   History of colonic polyps    History of COVID-19 04/2019   per pt had covid pneumonia recovered at home, no oxygen, symptoms resolved   History of diverticulitis of colon    History of hypertension 04/05/2009   per the pt he lost 55 lbs and htn has been reversed   History of kidney stones    Hyperlipidemia 04/05/2009   pt states he does not take med for cholesterol   OA (osteoarthritis) 04/05/2009   Psoriatic arthritis (HCC)    rheumonotologist--- dr a. ishmael   Type 2 diabetes mellitus (HCC) 04/05/2009   followed by pcp---  (01-31-2021  per pt only checks blood sugar once weekly, last fasting sugar-- 122)   Past Surgical History:  Procedure Laterality Date   COLONOSCOPY     last one 11-04-2020  by gessner   EXTRACORPOREAL SHOCK WAVE LITHOTRIPSY  2015   INCISION AND DRAINAGE ABSCESS N/A 02/03/2021   Procedure: EXCISION OF PERIANAL AND PERINEAL HYDRADENITIS, INTERROGATION OF PERIANAL WOUNDS;  Surgeon: Teresa Lonni HERO, MD;  Location: [facility];  Service: General;  Laterality: N/A;   PARTIAL COLECTOMY N/A 02/26/2023   Procedure: EXPLORATORY LAPAROTOMY; PARTIAL COLECTOMY; COLOSTOMY;  Surgeon: Vernetta Berg, MD;  Location: [facility];  Service: General;  Laterality: N/A;   RECTAL EXAM UNDER ANESTHESIA  02/03/2021   Procedure: ANORECTAL EXAM UNDER ANESTHESIA;  Surgeon: Teresa Lonni HERO, MD;  Location:  SURGERY CENTER;  Service: General;;   TONSILLECTOMY     child   TRANSURETHRAL RESECTION OF BLADDER TUMOR  02/2008   Patient Active Problem List   Diagnosis Date Noted   Cerebrovascular accident (CVA) (HCC) 06/07/2023   Protein-calorie malnutrition, severe 02/26/2023   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   Sigmoid diverticulitis 02/08/2023   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   Rectal bleeding 03/31/2020   Psoriatic arthritis (HCC) 02/21/2016   History of colonic polyp - sessile serrated polyp 03/12/2014   SHOULDER PAIN 01/05/2010   Malignant neoplasm of bladder (HCC) 04/05/2009   Type 2 diabetes mellitus (HCC) 04/05/2009   Hyperlipidemia 04/05/2009   Essential hypertension 04/05/2009   Osteoarthritis 04/05/2009    ONSET DATE: 05/23/23  REFERRING DIAG: I63.9 (ICD-10-CM) - Cerebrovascular accident (CVA), unspecified mechanism (HCC)  THERAPY DIAG:  Muscle weakness (generalized)  Unsteadiness on feet  Dizziness and giddiness  Other abnormalities of  gait and mobility  Rationale for Evaluation and Treatment: Rehabilitation  SUBJECTIVE:                                                                                                                                                                                             SUBJECTIVE STATEMENT: Reports that his colostomy reversal surgery may be delayed. However reports that he is recovering well from his stroke. Reports that he has returned driving this week- some increased concentration required when backing up. Reports no more issue staying in the center  of the lane but notes some eye strain.   Pt accompanied by: self  PERTINENT HISTORY: Bladder CA s/p surgery 2009, HTN, HLD, psoriatic arthritis, DMII, partial colectomy 2024  PAIN:  Are you having pain? No  PRECAUTIONS: Other: colostomy bag  RED FLAGS: None   WEIGHT BEARING RESTRICTIONS: No  FALLS: Has patient fallen in last 6 months? 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  PLOF:  retired; administrator, arts and exercising at [employer]  PATIENT GOALS: feel like I'm back to normal, driving, balance  OBJECTIVE:    TODAY'S TREATMENT: 07/10/23  LOWER EXTREMITY ROM:     Active  Right Eval Left Eval Right 07/10/23 Left 07/10/23  Hip flexion      Hip extension      Hip abduction      Hip adduction      Hip internal rotation      Hip external rotation      Knee flexion      Knee extension      Ankle dorsiflexion 10 8 4  0  Ankle plantarflexion      Ankle inversion      Ankle eversion       (Blank rows = not tested)  LOWER EXTREMITY MMT:    MMT (in sitting) Right Eval Left Eval Right 07/10/23 Left 07/10/23  Hip flexion 4+ 4- 4+ 4  Hip extension      Hip abduction 4 4 4+ 4+  Hip adduction 4 4- 4+ 4  Hip internal rotation      Hip external rotation      Knee flexion 4 4 5 5   Knee extension 5 4+ 5 5  Ankle dorsiflexion 5 5 5  4+  Ankle plantarflexion 4+ 4+ 5 4+  Ankle inversion      Ankle eversion      (Blank rows = not tested)   Activity Comments  FGA 27/30  FOTO [id number]  Lucious  string  Able to perform as close at 8 cm today without diplopia               [facility] PT Assessment - 07/10/23 0001       Functional Gait  Assessment   Gait Level Surface Walks 20 ft in less than 7 sec but greater than 5.5 sec, uses assistive device, slower speed, mild gait deviations, or deviates 6-10 in outside of the 12 in walkway width.     Change in Gait Speed Able to smoothly change walking speed without loss of balance or gait deviation. Deviate no more than 6 in outside of the 12 in walkway width.    Gait with Horizontal Head Turns Performs head turns smoothly with no change in gait. Deviates no more than 6 in outside 12 in walkway width    Gait with Vertical Head Turns Performs head turns with no change in gait. Deviates no more than 6 in outside 12 in walkway width.    Gait and Pivot Turn Pivot turns safely in greater than 3 sec and stops with no loss of balance, or pivot turns safely within 3 sec and stops with mild imbalance, requires small steps to catch balance.    Step Over Obstacle Is able to step over 2 stacked shoe boxes taped together (9 in total height) without changing gait speed. No evidence of imbalance.    Gait with Narrow Base of Support Is able to ambulate for 10 steps heel to toe with no staggering.    Gait with Eyes Closed Walks 20 ft, uses assistive device, slower speed, mild gait deviations, deviates 6-10 in outside 12 in walkway width. Ambulates 20 ft in less than 9 sec but greater than 7 sec.    Ambulating Backwards Walks 20 ft, no assistive devices, good speed, no evidence for imbalance, normal gait    Steps Alternating feet, no rail.    Total Score 27              HOME EXERCISE PROGRAM Last updated: 07/10/23 Access Code: [email or web address] URL: [email or web address] Date: 07/10/2023 Prepared by: [facility] - Outpatient  Rehab - [facility]  Exercises - Supine Piriformis Stretch with Foot on Ground  - 1 x daily - 5 x weekly - 2 sets - 30 sec hold - Supine Figure 4 Piriformis Stretch  - 1 x daily - 5 x weekly - 2 sets - 30 sec hold - Gastroc Stretch on Wall  - 1 x daily - 5 x weekly - 2 sets - 30 sec hold - Standing Tandem Balance with Counter Support  - 1 x daily - 5 x weekly - 2 sets - 30 sec hold - Marching with Resistance  - 1 x daily - 5 x weekly - 2 sets - 10 reps - Sidelying Hip  Adduction  - 1 x daily - 5 x weekly - 2 sets - 10 reps   PATIENT EDUCATION: Education details: edu on progress towards goals and remaining impairments, HEP update and consolidation  Person educated: Patient Education method: Explanation, Demonstration, Tactile cues, Verbal cues, and Handouts Education comprehension: verbalized understanding and returned demonstration    Note: Objective measures were completed at Evaluation unless otherwise noted.  DIAGNOSTIC FINDINGS: 05/23/23 brain MRI: Subacute right MCA territory infarcts with petechial hemorrhage in the right frontal lobe. No mass effect or midline shift.  COGNITION: Overall cognitive status: Within functional limits for tasks assessed   SENSATION: Diminished L lateral hand, L arm, L HS numbness;  intact to light touch    COORDINATION: Alternating pronation/supination: slight bradykinesia Alternating toe tap: intact B Finger to nose: slight bradykinesia and dysmetria L Heel to shin: intact B   MUSCLE TONE: WNL B   POSTURE: rounded shoulders, FHP, slight trunk lean forward  LOWER EXTREMITY ROM:     Active  Right Eval Left Eval  Hip flexion    Hip extension    Hip abduction    Hip adduction    Hip internal rotation    Hip external rotation    Knee flexion    Knee extension    Ankle dorsiflexion 10 8  Ankle plantarflexion    Ankle inversion    Ankle eversion     (Blank rows = not tested)  LOWER EXTREMITY MMT:    MMT (in sitting) Right Eval Left Eval  Hip flexion 4+ 4-  Hip extension    Hip abduction 4 4  Hip adduction 4 4-  Hip internal rotation    Hip external rotation    Knee flexion 4 4  Knee extension 5 4+  Ankle dorsiflexion 5 5  Ankle plantarflexion 4+ 4+  Ankle inversion    Ankle eversion    (Blank rows = not tested)   GAIT: Gait pattern: reduced step length, flexed trunk, slowed Assistive device utilized: None Level of assistance: Modified independence   FUNCTIONAL TESTS:   [facility] PT  Assessment - 07/10/23 0001       Functional Gait  Assessment   Gait Level Surface Walks 20 ft in less than 7 sec but greater than 5.5 sec, uses assistive device, slower speed, mild gait deviations, or deviates 6-10 in outside of the 12 in walkway width.    Change in Gait Speed Able to smoothly change walking speed without loss of balance or gait deviation. Deviate no more than 6 in outside of the 12 in walkway width.    Gait with Horizontal Head Turns Performs head turns smoothly with no change in gait. Deviates no more than 6 in outside 12 in walkway width    Gait with Vertical Head Turns Performs head turns with no change in gait. Deviates no more than 6 in outside 12 in walkway width.    Gait and Pivot Turn Pivot turns safely in greater than 3 sec and stops with no loss of balance, or pivot turns safely within 3 sec and stops with mild imbalance, requires small steps to catch balance.    Step Over Obstacle Is able to step over 2 stacked shoe boxes taped together (9 in total height) without changing gait speed. No evidence of imbalance.    Gait with Narrow Base of Support Is able to ambulate for 10 steps heel to toe with no staggering.    Gait with Eyes Closed Walks 20 ft, uses assistive device, slower speed, mild gait deviations, deviates 6-10 in outside 12 in walkway width. Ambulates 20 ft in less than 9 sec but greater than 7 sec.    Ambulating Backwards Walks 20 ft, no assistive devices, good speed, no evidence for imbalance, normal gait    Steps Alternating feet, no rail.    Total Score 27                 M-CTSIB  Condition 1: Firm Surface, EO 30 Sec, Normal Sway  Condition 2: Firm Surface, EC 30 Sec, Normal and Mild Sway  Condition 3: Foam Surface, EO 30 Sec, Mild Sway  Condition 4: Foam Surface, EC 20 Sec, Moderate  and Severe Sway     FOTO: [phone],                                                                                                                                TREATMENT DATE: 06/12/23    PATIENT EDUCATION: Education details: prognosis, POC, HEP Person educated: Patient Education method: Explanation, Demonstration, Tactile cues, Verbal cues, and Handouts Education comprehension: verbalized understanding and returned demonstration  HOME EXERCISE PROGRAM: Access Code: [email or web address] URL: [email or web address] Date: 06/12/2023 Prepared by: [facility] - Outpatient  Rehab - [facility]  Exercises - Sidelying Hip Abduction  - 1 x daily - 3 x weekly - 2 sets - 10 reps - Tandem Walking with Counter Support  - 1 x daily - 3 x weekly - 2 sets - 5 reps - Hamstring Curl with Weight Machine  - 1 x daily - 3 x weekly - 2 sets - 10 reps - Hip Adduction Machine  - 1 x daily - 3 x weekly - 2 sets - 10 reps - Hip Abduction Machine  - 1 x daily - 3 x weekly - 2 sets - 10 reps  GOALS: Goals reviewed with patient? Yes  SHORT TERM GOALS: Target date: 07/03/2023  Patient to be independent with initial HEP. Baseline: HEP initiated Goal status: MET    LONG TERM GOALS: Target date: 07/24/2023  Patient to be independent with advanced HEP. Baseline: Not yet initiated  Goal status: MET 07/10/23  Patient to demonstrate B LE strength >/=4+/5.  Baseline: See above; progressed but not quite met 07/10/23 Goal status: PARTIALLY MET 07/10/23  Patient to demonstrate B ankle dorsiflexion AROM at least 12 deg to improve gait efficiency.  Baseline: 10, 8 deg; 0,4 deg 07/10/23 Goal status: NOT MET 07/10/23  Patient to report return to gym regimen with modifications as needed.  Baseline: not yet returned; reports not returned d/t waiting for it to warm up outside but is working out at home and walking 2 miles/day 07/10/23 Goal status: PARTIALLY MET 07/10/23  Patient to demonstrate at least 25/30 on FGA in order to decrease risk of falls.  Baseline: 23; 27/30 07/10/23  Goal status: MET 07/10/23  Patient to score at least 84 on FOTO in order to indicate improved  functional outcomes.  Baseline: 54; 90.3651 07/10/23 Goal status: MET 07/10/23  ASSESSMENT:  CLINICAL IMPRESSION: Patient arrived to session with report of return to driving with c/o some eye strain and increased concentration required for driving but notes that this has improved since last trying. Strength testing revealed improvement in L hip flexion, B abduction and adduction, B knee flexion/extension. Gastroc length more limited today B. Patient reports that he continues to stay active at home and is walking 2 miles daily. Balance goal met and patient demonstrates decreased risk of falls. HEP was updated and patient is now ready for DC.    OBJECTIVE IMPAIRMENTS: Abnormal gait, decreased activity tolerance, decreased balance, decreased coordination,  difficulty walking, decreased ROM, decreased strength, dizziness, impaired flexibility, impaired sensation, and postural dysfunction.   ACTIVITY LIMITATIONS: carrying, lifting, bending, standing, squatting, stairs, transfers, bathing, toileting, dressing, reach over head, hygiene/grooming, and locomotion level  PARTICIPATION LIMITATIONS: meal prep, cleaning, laundry, driving, shopping, community activity, yard work, and church  PERSONAL FACTORS: Age, Fitness, Past/current experiences, Time since onset of injury/illness/exacerbation, and 3+ comorbidities: Bladder CA s/p surgery 2009, HTN, HLD, psoriatic arthritis, DMII, partial colectomy 2024  are also affecting patient's functional outcome.   REHAB POTENTIAL: Good  CLINICAL DECISION MAKING: Evolving/moderate complexity  EVALUATION COMPLEXITY: Moderate  PLAN:  PT FREQUENCY: 1x/week  PT DURATION: 6 weeks  PLANNED INTERVENTIONS: 97164- PT Re-evaluation, 97110-Therapeutic exercises, 97530- Therapeutic activity, [id number]- Neuromuscular re-education, 97535- Self Care, [id number]- Manual therapy, [id number]- Gait training, [phone]- Canalith repositioning, [id number]- Aquatic Therapy, 97014- Electrical stimulation  (unattended), [phone]- Electrical stimulation (manual), Patient/Family education, Balance training, Stair training, Taping, Dry Needling, Joint mobilization, Spinal mobilization, Vestibular training, Cryotherapy, and Moist heat  PLAN FOR NEXT SESSION: DC at this time   PHYSICAL THERAPY DISCHARGE SUMMARY  Visits from Start of Care: 5  Current functional level related to goals / functional outcomes: See above clinical impression   Remaining deficits: Reduced flexibility, strength, convergence    Education / Equipment: HEP  Plan: Patient agrees to discharge.  Patient goals were partially met. Patient is being discharged due to meeting the stated rehab goals.            Louana Terrilyn Christians, PT, DPT 07/10/23 3:29 PM  [facility] at [facility] [address] [place], [address] [place], [place] [address] Phone # [phone] Fax # [phone]

## 2023-07-10 ENCOUNTER — Telehealth: Payer: Self-pay | Admitting: Neurology

## 2023-07-10 ENCOUNTER — Ambulatory Visit: Payer: Medicare Other | Admitting: Physical Therapy

## 2023-07-10 ENCOUNTER — Ambulatory Visit: Payer: Medicare Other | Attending: Neurology | Admitting: Occupational Therapy

## 2023-07-10 DIAGNOSIS — I69354 Hemiplegia and hemiparesis following cerebral infarction affecting left non-dominant side: Secondary | ICD-10-CM | POA: Diagnosis not present

## 2023-07-10 DIAGNOSIS — M6281 Muscle weakness (generalized): Secondary | ICD-10-CM

## 2023-07-10 DIAGNOSIS — I639 Cerebral infarction, unspecified: Secondary | ICD-10-CM

## 2023-07-10 DIAGNOSIS — R2681 Unsteadiness on feet: Secondary | ICD-10-CM | POA: Insufficient documentation

## 2023-07-10 DIAGNOSIS — R208 Other disturbances of skin sensation: Secondary | ICD-10-CM | POA: Diagnosis not present

## 2023-07-10 DIAGNOSIS — R278 Other lack of coordination: Secondary | ICD-10-CM | POA: Diagnosis not present

## 2023-07-10 DIAGNOSIS — R42 Dizziness and giddiness: Secondary | ICD-10-CM

## 2023-07-10 DIAGNOSIS — R2689 Other abnormalities of gait and mobility: Secondary | ICD-10-CM | POA: Insufficient documentation

## 2023-07-10 NOTE — Telephone Encounter (Signed)
 Reviewed findings with vascular neurologist Dr. Jerri, his stroke are high on the right MCA cortical region, suspicious for embolic event, especially in the setting of fairly normal echocardiogram, ultrasound of carotid artery  Patient complains of chest tightness palpitation shortness of breath with exertion  Decided to proceed more extensive stroke evaluation including CT angiogram of head and neck, and cardiology referral for loop recorder to rule out atrial fibrillation  He has ostomy, but otherwise no new GI symptoms, would prefer complete above stroke evaluation first before elective GI procedure.

## 2023-07-10 NOTE — Telephone Encounter (Signed)
error 

## 2023-07-10 NOTE — Therapy (Signed)
 OUTPATIENT OCCUPATIONAL THERAPY NEURO  Treatment Note  Patient Name: Joshua [name] MRN: [id number] DOB:1956-03-24, 68 y.o., male Today's Date: 07/10/2023  PCP: Micheal Wolm ORN, MD REFERRING PROVIDER: Onita Duos, MD  END OF SESSION:  OT End of Session - 07/10/23 1541     Visit Number 8    Number of Visits 13    Date for OT Re-Evaluation 07/27/23    Authorization Type BCBS Medicare    OT Start Time 1403    OT Stop Time 1442    OT Time Calculation (min) 39 min                    Past Medical History:  Diagnosis Date   Anal fistula    Hidradenitis    History of bladder cancer 2009   urologist-- dr rosalind---  s/p TURBT 2010,  recurrence in office 2014 ,  none since per pt   History of colonic polyps    History of COVID-19 04/2019   per pt had covid pneumonia recovered at home, no oxygen, symptoms resolved   History of diverticulitis of colon    History of hypertension 04/05/2009   per the pt he lost 55 lbs and htn has been reversed   History of kidney stones    Hyperlipidemia 04/05/2009   pt states he does not take med for cholesterol   OA (osteoarthritis) 04/05/2009   Psoriatic arthritis (HCC)    rheumonotologist--- dr a. ishmael   Type 2 diabetes mellitus (HCC) 04/05/2009   followed by pcp---  (01-31-2021  per pt only checks blood sugar once weekly, last fasting sugar-- 122)   Past Surgical History:  Procedure Laterality Date   COLONOSCOPY     last one 11-04-2020  by gessner   EXTRACORPOREAL SHOCK WAVE LITHOTRIPSY  2015   INCISION AND DRAINAGE ABSCESS N/A 02/03/2021   Procedure: EXCISION OF PERIANAL AND PERINEAL HYDRADENITIS, INTERROGATION OF PERIANAL WOUNDS;  Surgeon: Teresa Lonni HERO, MD;  Location: [facility];  Service: General;  Laterality: N/A;   PARTIAL COLECTOMY N/A 02/26/2023   Procedure: EXPLORATORY LAPAROTOMY; PARTIAL COLECTOMY; COLOSTOMY;  Surgeon: Vernetta Berg, MD;  Location: [facility];  Service: General;  Laterality: N/A;    RECTAL EXAM UNDER ANESTHESIA  02/03/2021   Procedure: ANORECTAL EXAM UNDER ANESTHESIA;  Surgeon: Teresa Lonni HERO, MD;  Location: [facility];  Service: General;;   TONSILLECTOMY     child   TRANSURETHRAL RESECTION OF BLADDER TUMOR  02/2008   Patient Active Problem List   Diagnosis Date Noted   Cerebrovascular accident (CVA) (HCC) 06/07/2023   Protein-calorie malnutrition, severe 02/26/2023   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   Sigmoid diverticulitis 02/08/2023   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   Rectal bleeding 03/31/2020   Psoriatic arthritis (HCC) 02/21/2016   History of colonic polyp - sessile serrated polyp 03/12/2014   SHOULDER PAIN 01/05/2010   Malignant neoplasm of bladder (HCC) 04/05/2009   Type 2 diabetes mellitus (HCC) 04/05/2009   Hyperlipidemia 04/05/2009   Essential hypertension 04/05/2009   Osteoarthritis 04/05/2009    ONSET DATE: R MCA stroke in Dec 2024 (referral date 06/07/23)  REFERRING DIAG: I63.9 (ICD-10-CM) - Cerebrovascular accident (CVA), unspecified mechanism  THERAPY DIAG:  Muscle weakness (generalized)  Other disturbances of skin sensation  Hemiplegia and hemiparesis following cerebral infarction affecting left non-dominant side (HCC)  Other lack of coordination  Rationale for Evaluation and Treatment: Rehabilitation  SUBJECTIVE:   SUBJECTIVE STATEMENT: Pt reports that he has returned to driving.  Reports that he is only driving short distances, to church and back only.  Pt does recognize that he is feeling some eye strain but overall feeling okay.  Pt reports neurologist recommended that he f/u with cardiology.  Pt accompanied by: self (significant other dropped pt off - [name])  PERTINENT HISTORY: Bladder CA s/p surgery 2009, HTN, HLD, psoriatic arthritis, DMII, partial colectomy 2024   PRECAUTIONS: Other: colostomy bag  WEIGHT BEARING RESTRICTIONS: No  PAIN:  Are you having pain? No  FALLS: Has patient  fallen in last 6 months? No  LIVING ENVIRONMENT: Lives with: lives with their spouse Lives in: House/apartment Stairs: Yes: Internal: full flight of steps to 2nd floor; bedroom and bathroom on 2nd floor steps; on left going up and External: 1 step in through the garage steps;   Has following equipment at home: None; seat in the shower but more to hold items   PLOF: Independent, Independent with basic ADLs, and Leisure: loves golfing  PATIENT GOALS: I want to feel back to normal  OBJECTIVE:  Note: Objective measures were completed at Evaluation unless otherwise noted.  HAND DOMINANCE: Right  ADLs: Transfers/ambulation related to ADLs: Little bit of challenge with balance, wife reports he walks different Eating: increased focus/effort when cutting foods Grooming: increased difficulty with coordination/strength when using L hand when shaving UB Dressing: increased difficulty with buttoning shirt LB Dressing: Mod I Toileting: Mod I Bathing: Mod I Tub Shower transfers: Supervision when bathing, walk-in shower Equipment: Walk in shower  IADLs:  Light housekeeping: wife completing majority, he will wash dishes occasionally and start the washing machine Meal Prep: helps spouse with aspects of meal prep, he will also make breakfast  Community mobility: wife does not want pt driving at this time, but no physician has told him not to drive Medication management: Mod I, utilizing pill box which he fills independently Financial management: shared with wife  MOBILITY STATUS:  reports that spouse states he walks differently now  POSTURE COMMENTS:  rounded shoulders, forward head, and slight forward trunk lean  ACTIVITY TOLERANCE: Activity tolerance: WFL for tasks assessed on eval  FUNCTIONAL OUTCOME MEASURES: FOTO: 67, predicted 75 in 16 visits  UPPER EXTREMITY ROM:  L shoulder droop compared to R  Active ROM Right eval Left eval  Shoulder flexion 128 113  Shoulder abduction     Shoulder adduction    Shoulder extension    Shoulder internal rotation    Shoulder external rotation    Elbow flexion    Elbow extension    Wrist flexion    Wrist extension    Wrist ulnar deviation    Wrist radial deviation    Wrist pronation    Wrist supination    (Blank rows = not tested)  UPPER EXTREMITY MMT:     MMT Right eval Left eval  Shoulder flexion 4+ 4-  Shoulder abduction    Shoulder adduction    Shoulder extension    Shoulder internal rotation    Shoulder external rotation    Middle trapezius    Lower trapezius    Elbow flexion    Elbow extension    Wrist flexion    Wrist extension    Wrist ulnar deviation    Wrist radial deviation    Wrist pronation    Wrist supination    (Blank rows = not tested)  HAND FUNCTION: Grip strength: Right: 82 lbs; Left: 65 lbs  COORDINATION: 9 Hole Peg test: Right: 28.72 sec; Left: 34.03 sec Box  and Blocks:  Right 58 blocks, Left 47blocks (more gross grasp with LUE)  SENSATION: Light touch: Impaired ; reports numbness in thumb, index, and long finger on L hand  MUSCLE TONE: WNL bilaterally  COGNITION: Overall cognitive status: No family/caregiver present to determine baseline cognitive functioning; reports some difficulty with comprehension and attention with reading.  VISION: Subjective report: sometimes I have to refocus and notices intermittent dizziness Baseline vision: Wears glasses for reading only  VISION ASSESSMENT: To be further assessed in functional context  Patient has difficulty with following activities due to following visual impairments: driving, reading, reports some difficulty with comprehension and attention with reading.  PRAXIS: Impaired: Motor planning  OBSERVATIONS: OT questions cognition and awareness of deficits.  Pt reports difficulty with driving and that his spouse currently does not want him driving.                                                                                                                              TREATMENT DATE:  07/10/23 9 hole peg test: Left: 35.88 seconds.  Pt reports decreased sensation, numbness impacting ease of picking up and manipulating items.   Pill box assessment: pt completed in >7 minutes with focus on coordination with opening pill bottles, pill box, and sensation as filling out pill box for the week.  Pt demonstrates poor multi-tasking and problem solving throughout task as initially filling out by day, requiring extraneous movements with opening pill slots and bottles repeatedly, then transitioned to filling out entire week by medication.  Pt making errors by filling out 3 pills in one slot vs one pill 3 times a day.  Pt filled out in this manner x3 days before recognizing error.  Pt then able to verbalize error and how to correct it.   Sensation: engaged in desensitization strategies with focus on use of various textures with massage and gradually progressive pressure and various textures.  OT reiterating use of vision to compensate for impaired sensation.   Functional reach: engaged in reaching across midline to moderate - high range to facilitate increased shoulder ROM.  Pt removing 4,6# resistive clothespins with LUE and placing onto lower surface.  Pt demonstrating improvements in functional reach.      07/05/23 Therapeutic activities:  Pegs: engaged in small peg board pattern replication with focus on coordination with picking up and manipulating small pegs into peg board.  OT increased challenge to attempting translation of pegs from palm to finger tips to place into peg board.  Pt with ability to translate from finger tips to palm, but increased difficulty with translating from palm back to finger tips.  Removed pegs one at a time until 10-15 in palm and then placing into container, pt with increased ease translating finger tips to palm. Jenga: stacking jenga blocks in various patterns with focus on motor control and shoulder  flexion/reach with good control. Pipe tree puzzle: Engaged in pipe tree puzzle in sitting with focus on  increased shoulder flexion/reach and coordination and manipulation of pieces, and visual scanning and problem solving.  Pt demonstrating good functional use of LU when picking up and manipulating pieces.   Gross motor control: placing golf balls onto cones with use of tongs in L hand to challenge gross motor control and incorporation of shoulder flexion/reach.  Completed in horizontal pattern followed by vertical pattern to increase functional reach.  Pt dropping 15% of the time.  Tossing/catching ball with LUE with focus on motor control and grading pressure and speed to facilitate increased ease with catching.   Coordination: engaged in rotating 2 golf balls hand, pt with improved control when rotating balls counter-clockwise in L hand compared to previous session.  Pt attempting clockwise direction as well, still with challenge compared to counter clockwise direction.      07/03/23 Grip strength: Pt reports purchasing grip strengthener.  OT educated on attempting to hold sustained hold 3-5 seconds before releasing for sustained hold.  Grip strength assessment: L 75 and 76# on dynamometer.  Box and blocks: LUE 44 blocks, completed a 2nd trial completing 49 blocks.  Increased difficulty when blocks are right next to each other or the wall of box, due to impaired sensation. 9 hole peg test: L: 36.62 sec.  Pt still demonstrating mild impairment with coordination due to impaired sensation. Coordination: engaged in picking up coins with LUE one at a time to place into coin slot, picking up 5-10 coins 1 at a time and translating palm to fingertips to place into coin slot, and bimanual task of tearing paper and then rolling into a ball with just L hand. Shoulder strengthening: engaged in strengthening with red theraband with shoulder extension, scapular retraction, diagonal chop, internal rotation, and  external rotation from anchored position x10. Engaged in plank on wall with resistance band, slowly reaching L arm diagonally upward, then return to the plank position and repeat straight out to the side, then diagonally downward.  OT providing cues and education on modification of length of theraband to increase and decrease resistance. OT educating on focus on quality of movement.    PATIENT EDUCATION: Education details: ongoing condition specific education, with focus on [facility] and strengthening Person educated: Patient Education method: Explanation, Demonstration, and Handouts Education comprehension: verbalized understanding and needs further education  HOME EXERCISE PROGRAM: Access Code: [email or web address] URL: [email or web address] Date: 07/03/2023 (updated) Prepared by: [facility] - Outpatient  Rehab - [facility]  Program Notes Focus on quality of the movements.  Feel free to do less reps but more sets if that increases the quality.  Exercises - Seated Shoulder Shrugs  - 3 x weekly - 1-2 sets - 10 reps - 3-5 sec hold - Seated Scapular Retraction  - 3 x weekly - 2 sets - 10 reps - 3-5 sec hold - Shoulder Flexion with Resistance  - 3 x weekly - 2 sets - 10 reps - Seated Shoulder Extension with Resistance  - 3 x weekly - 2 sets - 10 reps - Scapular Retraction with Resistance  - 3 x weekly - 2 sets - 10 reps - Shoulder Internal Rotation with Resistance  - 3 x weekly - 2 sets - 10 reps - Shoulder External Rotation with Anchored Resistance  - 3 x weekly - 2 sets - 10 reps - Standing Diagonal Chop  - 3 x weekly - 2 sets - 10 reps - Shoulder extension with resistance - Neutral  - 3 x weekly - 2 sets - 10 reps - Standing  Plank on Wall with Reaches and Resistance  - 3 x weekly - 2 sets - 10 reps  Access Code: [email or web address] URL: [email or web address] Date: 06/26/2023 Prepared by: [facility] - Outpatient  Rehab - [facility]  Exercises - Putty Squeezes  - 2 x daily  - 10 reps - Rolling Putty on Table  - 2 x daily - Tip Pinch with Putty  - 2 x daily - 10 reps - Key Pinch with Putty  - 2 x daily - 5 reps - Finger Lumbricals with Putty  - 2 x daily - 10 reps - Finger Pinch and Pull with Putty  - 2 x daily - 10 reps - Removing Marbles from Putty  - 2 x daily - 10 reps  GOALS: Goals reviewed with patient? Yes  SHORT TERM GOALS: Target date: 07/06/23  Pt will be independent with coordination HEP. Baseline: Goal status: MET - 07/03/23  2.  Pt will be independent with L shoulder/UE strengthening HEP. Baseline:  Goal status: MET - 07/03/23  3.  Pt will verbalize understanding of strategies to compensate for impaired sensation.  Baseline:  Goal status: MET - 07/03/23  4.  Pt will demonstrate improved UE functional use for ADLs as evidenced by increasing box/ blocks score by 4 blocks with LUE. Baseline: Right 58 blocks, Left 47blocks  Goal status: NOT MET - L: 49 blocks   LONG TERM GOALS: Target date: 07/27/23  Pt will demonstrate ability to retrieve a moderate weight object at 125* shoulder flexion to demonstrated increased postural control, shoulder flexion, and UE strength as needed to complete ADLs and IADLs. Baseline: 113* Goal status: IN PROGRESS  2.  Pt will demonstrate improved fine motor coordination for ADLs as evidenced by decreasing 9 hole peg test score for LUE by 4 secs. Baseline: Right: 28.72 sec; Left: 34.03 sec Goal status: IN PROGRESS  3.  Pt will improved L hand grip strength by 6# to demonstrate increased sustained grasp as needed for ADLs and IADLs  Baseline: Right: 82 lbs; Left: 65 lbs Goal status: MET - 75.5# on 07/03/23  4.  Pt will demonstrate ability to sequence simple functional task (simple snack prep, laundry task, etc) at Mod I level with good safety awareness Baseline:  Goal status: IN PROGRESS  5.  Pt will navigate a moderately busy environment, visually attending to surroundings and obstacles, while following  multi-step commands with 90% accuracy. Baseline:  Goal status: IN PROGRESS  6.  Pt will verbalize understanding of safe return to driving recommendations Baseline:  Goal status: IN PROGRESS  ASSESSMENT:  CLINICAL IMPRESSION: Pt tolerating all tasks with good endurance and understanding of rationale.  Pt continues to demonstrate impaired coordination, impacted by impaired sensation.  Pt demonstrating good understanding of use of vision to attend to UE during coordination tasks to accommodate for impaired sensation. Pt demonstrating mild improvements in functional reach during therapeutic tasks this session.  Pt will continue to benefit from skilled occupational therapy services to address ROM, coordination, sensation, and attention during multi-tasking.  PERFORMANCE DEFICITS: in functional skills including ADLs, IADLs, coordination, sensation, ROM, strength, pain, Fine motor control, Gross motor control, balance, body mechanics, endurance, decreased knowledge of precautions, decreased knowledge of use of DME, vision, and UE functional use, cognitive skills including safety awareness, sequencing, and understand, and psychosocial skills including coping strategies, environmental adaptation, and routines and behaviors.     PLAN:  OT FREQUENCY: 2x/week  OT DURATION: 6 weeks  PLANNED INTERVENTIONS: 02831 OT Re-evaluation, 97535 self care/ADL  training, 02889 therapeutic exercise, 97530 therapeutic activity, 97112 neuromuscular re-education, 97140 manual therapy, 97035 ultrasound, 97018 paraffin, 02989 moist heat, 97010 cryotherapy, 97032 electrical stimulation (manual), passive range of motion, balance training, functional mobility training, compression bandaging, visual/perceptual remediation/compensation, psychosocial skills training, energy conservation, coping strategies training, patient/family education, and DME and/or AE instructions  RECOMMENDED OTHER SERVICES: possible SLP  CONSULTED AND  AGREED WITH PLAN OF CARE: Patient  PLAN FOR NEXT SESSION:  Review and add to coordination HEP, review and increase to green theraband with UE strengthening HEP, further assess cognition and safety awareness, Review theraband exercises, quadruped and or WB through LUE in standing due to back and knee issues   KAYLENE DOMINO, OTR/L 07/10/2023, 3:44 PM   [facility] at [facility] [address] [place], [place] [address] Phone # [phone] Fax # [phone]

## 2023-07-11 ENCOUNTER — Encounter: Payer: Self-pay | Admitting: Family Medicine

## 2023-07-11 ENCOUNTER — Telehealth: Payer: Self-pay | Admitting: Neurology

## 2023-07-11 ENCOUNTER — Ambulatory Visit: Payer: Medicare Other | Admitting: Family Medicine

## 2023-07-11 VITALS — BP 126/66 | HR 67 | Temp 97.6°F | Wt 186.5 lb

## 2023-07-11 DIAGNOSIS — I639 Cerebral infarction, unspecified: Secondary | ICD-10-CM

## 2023-07-11 DIAGNOSIS — E1165 Type 2 diabetes mellitus with hyperglycemia: Secondary | ICD-10-CM | POA: Diagnosis not present

## 2023-07-11 DIAGNOSIS — R972 Elevated prostate specific antigen [PSA]: Secondary | ICD-10-CM | POA: Diagnosis not present

## 2023-07-11 DIAGNOSIS — Z7984 Long term (current) use of oral hypoglycemic drugs: Secondary | ICD-10-CM

## 2023-07-11 DIAGNOSIS — R079 Chest pain, unspecified: Secondary | ICD-10-CM | POA: Diagnosis not present

## 2023-07-11 DIAGNOSIS — E785 Hyperlipidemia, unspecified: Secondary | ICD-10-CM

## 2023-07-11 LAB — PSA: PSA: 1.6 ng/mL (ref 0.10–4.00)

## 2023-07-11 LAB — HEMOGLOBIN A1C: Hgb A1c MFr Bld: 8.3 % — ABNORMAL HIGH (ref 4.6–6.5)

## 2023-07-11 NOTE — Telephone Encounter (Signed)
 Referral for cardiology sent through [facility] and fax to [facility]/ [address] [facility]. Phone: [phone], Fax: [phone]

## 2023-07-11 NOTE — Progress Notes (Signed)
 Established Patient Office Visit  Subjective   Patient ID: Joshua [name], male    DOB: [birth year]  Age: 68 y.o. MRN: [id number]  Chief Complaint  Patient presents with   Medical Management of Chronic Issues    HPI   Joshua [name] is here for medical follow-up.  Past medical history significant for hypertension, CVA, hyperlipidemia, diverticulitis, type 2 diabetes, psoriatic arthritis, history of bladder cancer.  He presented here in December with paresthesias left upper extremity and left face.  He had had recent shoulder injection per sports medicine and initially thought his symptoms were related to that.  We obtained MRI which showed subacute right MCA territory infarcts with petechial hemorrhage right frontal lobe.  His symptoms were already improving by the time we saw him.  We referred to neurology.  He is now taking low-dose rosuvastatin .  He had significant myalgias with simvastatin .  Also on aspirin  81 mg daily.  Still has some mild paresthesias couple of fingers the left hand but no weakness.  He had carotid Dopplers which showed no ICA stenosis.  Echocardiogram normal EF.  No regional wall motion abnormalities or major valve issues.  He has been referred to cardiology.  He does relate he has been on the treadmill trying to run when he gets his heart rate up to about 130 he tends to have some chest tightness substernally.  None at rest.  No history of known CAD.  Type 2 diabetes treated with Jardiance .  Last A1c 6.8%.  He had recent PSA 3.03 which was up previously from 1 range but this was over 7 years ago.  Past Medical History:  Diagnosis Date   Anal fistula    Hidradenitis    History of bladder cancer 2009   urologist-- dr rosalind---  s/p TURBT 2010,  recurrence in office 2014 ,  none since per pt   History of colonic polyps    History of COVID-19 04/2019   per pt had covid pneumonia recovered at home, no oxygen, symptoms resolved   History of diverticulitis of colon    History  of hypertension 04/05/2009   per the pt he lost 55 lbs and htn has been reversed   History of kidney stones    Hyperlipidemia 04/05/2009   pt states he does not take med for cholesterol   OA (osteoarthritis) 04/05/2009   Psoriatic arthritis (HCC)    rheumonotologist--- dr a. ishmael   Type 2 diabetes mellitus (HCC) 04/05/2009   followed by pcp---  (01-31-2021  per pt only checks blood sugar once weekly, last fasting sugar-- 122)   Past Surgical History:  Procedure Laterality Date   COLONOSCOPY     last one 11-04-2020  by gessner   EXTRACORPOREAL SHOCK WAVE LITHOTRIPSY  2015   INCISION AND DRAINAGE ABSCESS N/A 02/03/2021   Procedure: EXCISION OF PERIANAL AND PERINEAL HYDRADENITIS, INTERROGATION OF PERIANAL WOUNDS;  Surgeon: Teresa Lonni HERO, MD;  Location: [facility];  Service: General;  Laterality: N/A;   PARTIAL COLECTOMY N/A 02/26/2023   Procedure: EXPLORATORY LAPAROTOMY; PARTIAL COLECTOMY; COLOSTOMY;  Surgeon: Vernetta Berg, MD;  Location: [facility];  Service: General;  Laterality: N/A;   RECTAL EXAM UNDER ANESTHESIA  02/03/2021   Procedure: ANORECTAL EXAM UNDER ANESTHESIA;  Surgeon: Teresa Lonni HERO, MD;  Location:  SUR[facility]ERY CENTER;  Service: General;;   TONSILLECTOMY     child   TRANSURETHRAL RESECTION OF BLADDER TUMOR  02/2008    reports that he has been smoking cigarettes and cigars. He  has never used smokeless tobacco. He reports that he does not currently use alcohol. He reports that he does not use drugs. family history includes Arthritis in his father; Crohn's disease in his daughter; Diabetes in his mother; Hypertension in his mother. Allergies  Allergen Reactions   Adalimumab Other (See Comments)   Penicillin G Other (See Comments)   Penicillins Hives    And fever blisters    Review of Systems  Constitutional:  Negative for chills, fever and malaise/fatigue.  Eyes:  Negative for blurred vision.  Respiratory:  Negative for shortness of  breath.   Cardiovascular:  Positive for chest pain. Negative for palpitations, orthopnea, leg swelling and PND.  Neurological:  Negative for dizziness, weakness and headaches.      Objective:     BP 126/66 (BP Location: Left Arm, Patient Position: Sitting, Cuff Size: Normal)   Pulse 67   Temp 97.6 F (36.4 C) (Oral)   Wt 186 lb 8 oz (84.6 kg)   SpO2 99%   BMI 28.36 kg/m  BP Readings from Last 3 Encounters:  07/11/23 126/66  06/07/23 (!) 144/84  05/16/23 132/70   Wt Readings from Last 3 Encounters:  07/11/23 186 lb 8 oz (84.6 kg)  06/18/23 177 lb (80.3 kg)  06/07/23 180 lb 8 oz (81.9 kg)      Physical Exam Vitals reviewed.  Constitutional:      Appearance: He is well-developed.  Eyes:     Pupils: Pupils are equal, round, and reactive to light.  Neck:     Thyroid : No thyromegaly.  Cardiovascular:     Rate and Rhythm: Normal rate and regular rhythm.  Pulmonary:     Effort: Pulmonary effort is normal. No respiratory distress.     Breath sounds: Normal breath sounds. No wheezing or rales.  Musculoskeletal:     Cervical back: Neck supple.     Right lower leg: No edema.     Left lower leg: No edema.  Neurological:     Mental Status: He is alert and oriented to person, place, and time.      No results found for any visits on 07/11/23.  Last CBC Lab Results  Component Value Date   WBC 9.3 04/10/2023   HGB 14.5 04/10/2023   HCT 44.3 04/10/2023   MCV 96.8 04/10/2023   MCH 31.5 03/01/2023   RDW 14.7 04/10/2023   PLT 307.0 04/10/2023   Last metabolic panel Lab Results  Component Value Date   GLUCOSE 134 (H) 04/10/2023   NA 138 04/10/2023   K 4.3 04/10/2023   CL 102 04/10/2023   CO2 27 04/10/2023   BUN 18 04/10/2023   CREATININE 0.84 04/10/2023   GFR 90.22 04/10/2023   CALCIUM  10.0 04/10/2023   PROT 7.9 04/10/2023   ALBUMIN 4.1 04/10/2023   BILITOT 0.7 04/10/2023   ALKPHOS 102 04/10/2023   AST 14 04/10/2023   ALT 19 04/10/2023   ANIONGAP 9  03/01/2023   Last lipids Lab Results  Component Value Date   CHOL 175 04/10/2023   HDL 49.40 04/10/2023   LDLCALC 105 (H) 04/10/2023   LDLDIRECT 131.0 09/02/2018   TRIG 107.0 04/10/2023   CHOLHDL 4 04/10/2023   Last hemoglobin A1c Lab Results  Component Value Date   HGBA1C 6.8 (A) 04/10/2023      The ASCVD Risk score (Arnett DK, et al., 2019) failed to calculate for the following reasons:   Risk score cannot be calculated because patient has a medical history suggesting prior/existing ASCVD  Assessment & Plan:   #1 recent right MCA territory CVA patient currently on aspirin  81 mg daily and rosuvastatin  5 mg daily.  Had myalgias with simvastatin .  He had mostly left upper extremity symptoms which have essentially resolved other than some very minimal residual paresthesias couple fingers of the left hand.  No weakness.  Gait normal.  Recent carotid Dopplers unremarkable.  Echocardiogram no major acute abnormalities.  #2 type 2 diabetes.  Last A1c 6.8%.  Patient on Jardiance .  Previous intolerance to metformin .  Recheck A1c today.  Goal A1c less than 6.5%  #3 history of mild dyslipidemia.  Goal LDL less than 70.  Future lab for lipid and CMP in about 1 month which will make 2 months from when he was started on rosuvastatin .  #4 recent PSA over 3.  This compared with 1 range several years prior.  Recheck PSA today to assess stability.  #5 recent intermittent chest tightness with running.  He has noted when he gets his heart rate over 130 he tends to have some substernal tightness.  He has pending cardiology follow-up.  No symptoms at rest.  We strongly advise he avoid any heavy exertion until evaluated by cardiology.   No follow-ups on file.    Wolm Scarlet, MD

## 2023-07-12 ENCOUNTER — Ambulatory Visit: Payer: Medicare Other | Admitting: Occupational Therapy

## 2023-07-12 DIAGNOSIS — I69354 Hemiplegia and hemiparesis following cerebral infarction affecting left non-dominant side: Secondary | ICD-10-CM

## 2023-07-12 DIAGNOSIS — R208 Other disturbances of skin sensation: Secondary | ICD-10-CM | POA: Diagnosis not present

## 2023-07-12 DIAGNOSIS — R278 Other lack of coordination: Secondary | ICD-10-CM | POA: Diagnosis not present

## 2023-07-12 DIAGNOSIS — R2681 Unsteadiness on feet: Secondary | ICD-10-CM | POA: Diagnosis not present

## 2023-07-12 DIAGNOSIS — M6281 Muscle weakness (generalized): Secondary | ICD-10-CM

## 2023-07-12 DIAGNOSIS — R42 Dizziness and giddiness: Secondary | ICD-10-CM | POA: Diagnosis not present

## 2023-07-12 DIAGNOSIS — R2689 Other abnormalities of gait and mobility: Secondary | ICD-10-CM | POA: Diagnosis not present

## 2023-07-12 NOTE — Therapy (Signed)
 OUTPATIENT OCCUPATIONAL THERAPY NEURO  Treatment Note  Patient Name: Joshua [name] MRN: [id number] DOB:[birth year], 68 y.o., male Today's Date: 07/12/2023  PCP: Micheal Wolm ORN, MD REFERRING PROVIDER: Onita Duos, MD  END OF SESSION:  OT End of Session - 07/12/23 1403     Visit Number 9    Number of Visits 13    Date for OT Re-Evaluation 07/27/23    Authorization Type BC[employer] Medicare    OT Start Time 1402    OT Stop Time 1442    OT Time Calculation (min) 40 min                     Past Medical History:  Diagnosis Date   Anal fistula    Hidradenitis    History of bladder cancer 2009   urologist-- dr rosalind---  s/p TURBT 2010,  recurrence in office 2014 ,  none since per pt   History of colonic polyps    History of COVID-19 04/2019   per pt had covid pneumonia recovered at home, no oxygen, symptoms resolved   History of diverticulitis of colon    History of hypertension 04/05/2009   per the pt he lost 55 lbs and htn has been reversed   History of kidney stones    Hyperlipidemia 04/05/2009   pt states he does not take med for cholesterol   OA (osteoarthritis) 04/05/2009   Psoriatic arthritis (HCC)    rheumonotologist--- dr a. ishmael   Type 2 diabetes mellitus (HCC) 04/05/2009   followed by pcp---  (01-31-2021  per pt only checks blood sugar once weekly, last fasting sugar-- 122)   Past Surgical History:  Procedure Laterality Date   COLONOSCOPY     last one 11-04-2020  by gessner   EXTRACORPOREAL SHOCK WAVE LITHOTRIPSY  2015   INCISION AND DRAINAGE ABSCESS N/A 02/03/2021   Procedure: EXCISION OF PERIANAL AND PERINEAL HYDRADENITIS, INTERROGATION OF PERIANAL WOUNDS;  Surgeon: Teresa Lonni HERO, MD;  Location: [facility];  Service: General;  Laterality: N/A;   PARTIAL COLECTOMY N/A 02/26/2023   Procedure: EXPLORATORY LAPAROTOMY; PARTIAL COLECTOMY; COLOSTOMY;  Surgeon: Vernetta Berg, MD;  Location: [facility];  Service: General;  Laterality:  N/A;   RECTAL EXAM UNDER ANESTHESIA  02/03/2021   Procedure: ANORECTAL EXAM UNDER ANESTHESIA;  Surgeon: Teresa Lonni HERO, MD;  Location: [facility];  Service: General;;   TONSILLECTOMY     child   TRANSURETHRAL RESECTION OF BLADDER TUMOR  02/2008   Patient Active Problem List   Diagnosis Date Noted   Cerebrovascular accident (CVA) (HCC) 06/07/2023   Protein-calorie malnutrition, severe 02/26/2023   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   Sigmoid diverticulitis 02/08/2023   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   Rectal bleeding 03/31/2020   Psoriatic arthritis (HCC) 02/21/2016   History of colonic polyp - sessile serrated polyp 03/12/2014   SHOULDER PAIN 01/05/2010   Malignant neoplasm of bladder (HCC) 04/05/2009   Type 2 diabetes mellitus (HCC) 04/05/2009   Hyperlipidemia 04/05/2009   Essential hypertension 04/05/2009   Osteoarthritis 04/05/2009    ONSET DATE: R MCA stroke in Dec 2024 (referral date 06/07/23)  REFERRING DIAG: I63.9 (ICD-10-CM) - Cerebrovascular accident (CVA), unspecified mechanism  THERAPY DIAG:  Hemiplegia and hemiparesis following cerebral infarction affecting left non-dominant side (HCC)  Other lack of coordination  Other disturbances of skin sensation  Muscle weakness (generalized)  Rationale for Evaluation and Treatment: Rehabilitation  SUBJECTIVE:   SUBJECTIVE STATEMENT: Pt reports having an EKG yesterday and reporting  that it went well.  Pt reports that he may need to change meds as his blood sugar has been running a bit high recently.    Pt accompanied by: self (significant other dropped pt off - [name])  PERTINENT HISTORY: Bladder CA s/p surgery 2009, HTN, HLD, psoriatic arthritis, DMII, partial colectomy 2024   PRECAUTIONS: Other: colostomy bag  WEIGHT BEARING RESTRICTIONS: No  PAIN:  Are you having pain? No  FALLS: Has patient fallen in last 6 months? No  LIVING ENVIRONMENT: Lives with: lives with their  spouse Lives in: House/apartment Stairs: Yes: Internal: full flight of steps to 2nd floor; bedroom and bathroom on 2nd floor steps; on left going up and External: 1 step in through the garage steps;   Has following equipment at home: None; seat in the shower but more to hold items   PLOF: Independent, Independent with basic ADLs, and Leisure: loves golfing  PATIENT GOALS: I want to feel back to normal  OBJECTIVE:  Note: Objective measures were completed at Evaluation unless otherwise noted.  HAND DOMINANCE: Right  ADLs: Transfers/ambulation related to ADLs: Little bit of challenge with balance, wife reports he walks different Eating: increased focus/effort when cutting foods Grooming: increased difficulty with coordination/strength when using L hand when shaving UB Dressing: increased difficulty with buttoning shirt LB Dressing: Mod I Toileting: Mod I Bathing: Mod I Tub Shower transfers: Supervision when bathing, walk-in shower Equipment: Walk in shower  IADLs:  Light housekeeping: wife completing majority, he will wash dishes occasionally and start the washing machine Meal Prep: helps spouse with aspects of meal prep, he will also make breakfast  Community mobility: wife does not want pt driving at this time, but no physician has told him not to drive Medication management: Mod I, utilizing pill box which he fills independently Financial management: shared with wife  MOBILITY STATUS:  reports that spouse states he walks differently now  POSTURE COMMENTS:  rounded shoulders, forward head, and slight forward trunk lean  ACTIVITY TOLERANCE: Activity tolerance: WFL for tasks assessed on eval  FUNCTIONAL OUTCOME MEASURES: FOTO: 67, predicted 75 in 16 visits 07/12/23: FOTO 77 on visit 9  UPPER EXTREMITY ROM:  L shoulder droop compared to R  Active ROM Right eval Left eval Left 07/12/23  Shoulder flexion 128 113 128  Shoulder abduction     Shoulder adduction     Shoulder  extension     Shoulder internal rotation     Shoulder external rotation     Elbow flexion     Elbow extension     Wrist flexion     Wrist extension     Wrist ulnar deviation     Wrist radial deviation     Wrist pronation     Wrist supination     (Blank rows = not tested)  UPPER EXTREMITY MMT:     MMT Right eval Left eval Left 07/12/23  Shoulder flexion 4+ 4- 4  Shoulder abduction     Shoulder adduction     Shoulder extension     Shoulder internal rotation     Shoulder external rotation     Middle trapezius     Lower trapezius     Elbow flexion     Elbow extension     Wrist flexion     Wrist extension     Wrist ulnar deviation     Wrist radial deviation     Wrist pronation     Wrist supination     (  Blank rows = not tested)  HAND FUNCTION: Grip strength: Right: 82 lbs; Left: 65 lbs  COORDINATION: 9 Hole Peg test: Right: 28.72 sec; Left: 34.03 sec Box and Blocks:  Right 58 blocks, Left 47blocks (more gross grasp with LUE)  SENSATION: Light touch: Impaired ; reports numbness in thumb, index, and long finger on L hand  MUSCLE TONE: WNL bilaterally  COGNITION: Overall cognitive status: No family/caregiver present to determine baseline cognitive functioning; reports some difficulty with comprehension and attention with reading.  VISION: Subjective report: sometimes I have to refocus and notices intermittent dizziness Baseline vision: Wears glasses for reading only  VISION ASSESSMENT: To be further assessed in functional context  Patient has difficulty with following activities due to following visual impairments: driving, reading, reports some difficulty with comprehension and attention with reading.  PRAXIS: Impaired: Motor planning  OBSERVATIONS: OT questions cognition and awareness of deficits.  Pt reports difficulty with driving and that his spouse currently does not want him driving.                                                                                                                              TREATMENT DATE:  07/12/23 Grooved pegs: with LUE for increased coordination. Pt placed pegs with one at a time and removed with in hand manipulation. Pt completed with min difficulty with in hand manipulation, however no drops.   FOTO: 77 (up from 67) - still reporting mild difficulty with picking up coins from table with L hand GMC: stacking cups in 10 frame pyramid form to facilitate increased functional and sustained grasp as well as coordination with cups with LUE.  Pt initially demonstrating difficulty due to increased pace of task, however once pt slowed down and focused on movements pt with improved motor control.  Engaged in rotating of cups, pt with no issues from a shoulder or coordination standpoint.   Shoulder strengthening: engaged in strengthening with green theraband with shoulder extension, scapular retraction, diagonal chop, internal rotation, and external rotation from anchored position x10-15. OT providing tactile cue at L scapula to facilitate increased retraction.  OT then increased height of resistance with pt completing each exercise additional 10-15x with good technique.  Engaged in plank on wall with resistance band, slowly reaching L arm diagonally upward, then return to the plank position and repeat straight out to the side, then diagonally downward.  Pt with increased difficulty with last exercise with green theraband, therefore recommend continued use of red band with this final exercise.  OT reiterating education on modification of length of theraband to increase and decrease resistance. OT educating on focus on quality of movement.    07/10/23 9 hole peg test: Left: 35.88 seconds.  Pt reports decreased sensation, numbness impacting ease of picking up and manipulating items.   Pill box assessment: pt completed in >7 minutes with focus on coordination with opening pill bottles, pill box, and sensation as filling out pill box  for  the week.  Pt demonstrates poor multi-tasking and problem solving throughout task as initially filling out by day, requiring extraneous movements with opening pill slots and bottles repeatedly, then transitioned to filling out entire week by medication.  Pt making errors by filling out 3 pills in one slot vs one pill 3 times a day.  Pt filled out in this manner x3 days before recognizing error.  Pt then able to verbalize error and how to correct it.   Sensation: engaged in desensitization strategies with focus on use of various textures with massage and gradually progressive pressure and various textures.  OT reiterating use of vision to compensate for impaired sensation.   Functional reach: engaged in reaching across midline to moderate - high range to facilitate increased shoulder ROM.  Pt removing 4,6# resistive clothespins with LUE and placing onto lower surface.  Pt demonstrating improvements in functional reach.      07/05/23 Therapeutic activities:  Pegs: engaged in small peg board pattern replication with focus on coordination with picking up and manipulating small pegs into peg board.  OT increased challenge to attempting translation of pegs from palm to finger tips to place into peg board.  Pt with ability to translate from finger tips to palm, but increased difficulty with translating from palm back to finger tips.  Removed pegs one at a time until 10-15 in palm and then placing into container, pt with increased ease translating finger tips to palm. Jenga: stacking jenga blocks in various patterns with focus on motor control and shoulder flexion/reach with good control. Pipe tree puzzle: Engaged in pipe tree puzzle in sitting with focus on increased shoulder flexion/reach and coordination and manipulation of pieces, and visual scanning and problem solving.  Pt demonstrating good functional use of LU when picking up and manipulating pieces.   Gross motor control: placing golf balls onto  cones with use of tongs in L hand to challenge gross motor control and incorporation of shoulder flexion/reach.  Completed in horizontal pattern followed by vertical pattern to increase functional reach.  Pt dropping 15% of the time.  Tossing/catching ball with LUE with focus on motor control and grading pressure and speed to facilitate increased ease with catching.   Coordination: engaged in rotating 2 golf balls hand, pt with improved control when rotating balls counter-clockwise in L hand compared to previous session.  Pt attempting clockwise direction as well, still with challenge compared to counter clockwise direction.       PATIENT EDUCATION: Education details: ongoing condition specific education, with focus on [facility] and strengthening Person educated: Patient Education method: Explanation, Demonstration, and Handouts Education comprehension: verbalized understanding and needs further education  HOME EXERCISE PROGRAM: Access Code: [email or web address] URL: [email or web address] Date: 07/03/2023 (updated) Prepared by: [facility] - Outpatient  Rehab - [facility]  Program Notes Focus on quality of the movements.  Feel free to do less reps but more sets if that increases the quality.  Exercises - Seated Shoulder Shrugs  - 3 x weekly - 1-2 sets - 10 reps - 3-5 sec hold - Seated Scapular Retraction  - 3 x weekly - 2 sets - 10 reps - 3-5 sec hold - Shoulder Flexion with Resistance  - 3 x weekly - 2 sets - 10 reps - Seated Shoulder Extension with Resistance  - 3 x weekly - 2 sets - 10 reps - Scapular Retraction with Resistance  - 3 x weekly - 2 sets - 10 reps - Shoulder Internal Rotation with Resistance  -  3 x weekly - 2 sets - 10 reps - Shoulder External Rotation with Anchored Resistance  - 3 x weekly - 2 sets - 10 reps - Standing Diagonal Chop  - 3 x weekly - 2 sets - 10 reps - Shoulder extension with resistance - Neutral  - 3 x weekly - 2 sets - 10 reps - Standing Plank on Wall  with Reaches and Resistance  - 3 x weekly - 2 sets - 10 reps  Access Code: [email or web address] URL: [email or web address] Date: 06/26/2023 Prepared by: [facility] - Outpatient  Rehab - [facility]  Exercises - Putty Squeezes  - 2 x daily - 10 reps - Rolling Putty on Table  - 2 x daily - Tip Pinch with Putty  - 2 x daily - 10 reps - Key Pinch with Putty  - 2 x daily - 5 reps - Finger Lumbricals with Putty  - 2 x daily - 10 reps - Finger Pinch and Pull with Putty  - 2 x daily - 10 reps - Removing Marbles from Putty  - 2 x daily - 10 reps  GOALS: Goals reviewed with patient? Yes  SHORT TERM GOALS: Target date: 07/06/23  Pt will be independent with coordination HEP. Baseline: Goal status: MET - 07/03/23  2.  Pt will be independent with L shoulder/UE strengthening HEP. Baseline:  Goal status: MET - 07/03/23  3.  Pt will verbalize understanding of strategies to compensate for impaired sensation.  Baseline:  Goal status: MET - 07/03/23  4.  Pt will demonstrate improved UE functional use for ADLs as evidenced by increasing box/ blocks score by 4 blocks with LUE. Baseline: Right 58 blocks, Left 47blocks  Goal status: NOT MET - L: 49 blocks   LONG TERM GOALS: Target date: 07/27/23  Pt will demonstrate ability to retrieve a moderate weight object at 125* shoulder flexion to demonstrated increased postural control, shoulder flexion, and UE strength as needed to complete ADLs and IADLs. Baseline: 113* Goal status: IN PROGRESS  2.  Pt will demonstrate improved fine motor coordination for ADLs as evidenced by decreasing 9 hole peg test score for LUE by 4 secs. Baseline: Right: 28.72 sec; Left: 34.03 sec Goal status: IN PROGRESS  3.  Pt will improved L hand grip strength by 6# to demonstrate increased sustained grasp as needed for ADLs and IADLs  Baseline: Right: 82 lbs; Left: 65 lbs Goal status: MET - 75.5# on 07/03/23  4.  Pt will demonstrate ability to sequence simple  functional task (simple snack prep, laundry task, etc) at Mod I level with good safety awareness Baseline:  Goal status: IN PROGRESS  5.  Pt will navigate a moderately busy environment, visually attending to surroundings and obstacles, while following multi-step commands with 90% accuracy. Baseline:  Goal status: IN PROGRESS  6.  Pt will verbalize understanding of safe return to driving recommendations Baseline:  Goal status: MET - pt reports still having some eye strain but no veering on 07/12/23  ASSESSMENT:  CLINICAL IMPRESSION: Pt tolerating all tasks with good endurance and understanding of rationale.  Completed FOTO this session with pt reporting improved functional grasp and use of LUE, most difficulty still remaining in smaller, fine motor control tasks.  Pt continues to demonstrate impaired coordination, impacted by impaired sensation.  Pt demonstrating good understanding of use of vision to attend to UE during coordination tasks to accommodate for impaired sensation. Pt demonstrating improvements in shoulder ROM both during reaching activity and against resistance.  Pt  will continue to benefit from skilled occupational therapy services to address ROM, coordination, sensation, and attention during multi-tasking.  PERFORMANCE DEFICITS: in functional skills including ADLs, IADLs, coordination, sensation, ROM, strength, pain, Fine motor control, Gross motor control, balance, body mechanics, endurance, decreased knowledge of precautions, decreased knowledge of use of DME, vision, and UE functional use, cognitive skills including safety awareness, sequencing, and understand, and psychosocial skills including coping strategies, environmental adaptation, and routines and behaviors.     PLAN:  OT FREQUENCY: 2x/week  OT DURATION: 6 weeks  PLANNED INTERVENTIONS: 97168 OT Re-evaluation, 97535 self care/ADL training, 02889 therapeutic exercise, 97530 therapeutic activity, 97112 neuromuscular  re-education, 97140 manual therapy, 97035 ultrasound, 97018 paraffin, 02989 moist heat, 97010 cryotherapy, 97032 electrical stimulation (manual), passive range of motion, balance training, functional mobility training, compression bandaging, visual/perceptual remediation/compensation, psychosocial skills training, energy conservation, coping strategies training, patient/family education, and DME and/or AE instructions  RECOMMENDED OTHER SERVICES: possible SLP  CONSULTED AND AGREED WITH PLAN OF CARE: Patient  PLAN FOR NEXT SESSION:  Review and add to coordination HEP,  further assess cognition and safety awareness - dual tasking activities,  quadruped and or WB through LUE in standing due to back and knee issues   KAYLENE DOMINO, OTR/L 07/12/2023, 2:03 PM   [facility] at [facility] [address] [place], [place] [address] Phone # [phone] Fax # [phone]

## 2023-07-12 NOTE — Addendum Note (Signed)
 Addended by: Aurelio Leer on: 07/12/2023 01:08 PM   Modules accepted: Orders

## 2023-07-17 ENCOUNTER — Ambulatory Visit: Payer: Medicare Other | Admitting: Occupational Therapy

## 2023-07-17 ENCOUNTER — Telehealth: Payer: Self-pay | Admitting: Neurology

## 2023-07-17 DIAGNOSIS — M6281 Muscle weakness (generalized): Secondary | ICD-10-CM

## 2023-07-17 DIAGNOSIS — R278 Other lack of coordination: Secondary | ICD-10-CM | POA: Diagnosis not present

## 2023-07-17 DIAGNOSIS — R208 Other disturbances of skin sensation: Secondary | ICD-10-CM

## 2023-07-17 DIAGNOSIS — R42 Dizziness and giddiness: Secondary | ICD-10-CM | POA: Diagnosis not present

## 2023-07-17 DIAGNOSIS — R2689 Other abnormalities of gait and mobility: Secondary | ICD-10-CM | POA: Diagnosis not present

## 2023-07-17 DIAGNOSIS — R2681 Unsteadiness on feet: Secondary | ICD-10-CM | POA: Diagnosis not present

## 2023-07-17 DIAGNOSIS — I69354 Hemiplegia and hemiparesis following cerebral infarction affecting left non-dominant side: Secondary | ICD-10-CM | POA: Diagnosis not present

## 2023-07-17 NOTE — Therapy (Signed)
OUTPATIENT OCCUPATIONAL THERAPY NEURO  Treatment Note  Patient Name: Joshua [name] MRN: [id number] DOB:[birth year], 68 y.o., male Today's Date: 07/18/2023  PCP: Kristian Covey, MD REFERRING PROVIDER: Levert Feinstein, MD  Occupational Therapy Progress Note  Dates of Reporting Period: 06/12/23 to 07/17/23  Objective Reports of Subjective Statement: Pt is making great progress towards goals.  Pt is demonstrating improvements in grip strength, awareness, and coordination.  However pt still with mild impaired sensation which negatively impacts his coordination.    Plan: Pt will continue to benefit from OT services to further address sensation and fine motor coordination.   END OF SESSION:  OT End of Session - 07/17/23 1607     Visit Number 10    Number of Visits 13    Date for OT Re-Evaluation 07/27/23    Authorization Type BCBS Medicare    OT Start Time 1402    OT Stop Time 1442    OT Time Calculation (min) 40 min                      Past Medical History:  Diagnosis Date   Anal fistula    Hidradenitis    History of bladder cancer 2009   urologist-- [name]---  s/p TURBT 2010,  recurrence in office 2014 ,  none since per pt   History of colonic polyps    History of COVID-19 04/2019   per pt had covid pneumonia recovered at home, no oxygen, symptoms resolved   History of diverticulitis of colon    History of hypertension 04/05/2009   per the pt he lost 55 lbs and htn has been reversed   History of kidney stones    Hyperlipidemia 04/05/2009   pt states he does not take med for cholesterol   OA (osteoarthritis) 04/05/2009   Psoriatic arthritis (HCC)    rheumonotologist--- [name]   Type 2 diabetes mellitus (HCC) 04/05/2009   followed by pcp---  (01-31-2021  per pt only checks blood sugar once weekly, last fasting sugar-- 122)   Past Surgical History:  Procedure Laterality Date   COLONOSCOPY     last one 11-04-2020  by gessner   EXTRACORPOREAL SHOCK  WAVE LITHOTRIPSY  2015   INCISION AND DRAINAGE ABSCESS N/A 02/03/2021   Procedure: EXCISION OF PERIANAL AND PERINEAL HYDRADENITIS, INTERROGATION OF PERIANAL WOUNDS;  Surgeon: [name], MD;  Location: [facility];  Service: General;  Laterality: N/A;   PARTIAL COLECTOMY N/A 02/26/2023   Procedure: EXPLORATORY LAPAROTOMY; PARTIAL COLECTOMY; COLOSTOMY;  Surgeon: [name], MD;  Location: [facility];  Service: General;  Laterality: N/A;   RECTAL EXAM UNDER ANESTHESIA  02/03/2021   Procedure: ANORECTAL EXAM UNDER ANESTHESIA;  Surgeon: [name], MD;  Location: [facility];  Service: General;;   TONSILLECTOMY     child   TRANSURETHRAL RESECTION OF BLADDER TUMOR  02/2008   Patient Active Problem List   Diagnosis Date Noted   Cerebrovascular accident (CVA) (HCC) 06/07/2023   Protein-calorie malnutrition, severe 02/26/2023   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   Sigmoid diverticulitis 02/08/2023   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   Rectal bleeding 03/31/2020   Psoriatic arthritis (HCC) 02/21/2016   History of colonic polyp - sessile serrated polyp 03/12/2014   SHOULDER PAIN 01/05/2010   Malignant neoplasm of bladder (HCC) 04/05/2009   Type 2 diabetes mellitus (HCC) 04/05/2009   Hyperlipidemia 04/05/2009   Essential hypertension 04/05/2009   Osteoarthritis 04/05/2009    ONSET  DATE: R MCA stroke in Dec 2024 (referral date 06/07/23)  REFERRING DIAG: I63.9 (ICD-10-CM) - Cerebrovascular accident (CVA), unspecified mechanism  THERAPY DIAG:  Hemiplegia and hemiparesis following cerebral infarction affecting left non-dominant side (HCC)  Other lack of coordination  Other disturbances of skin sensation  Muscle weakness (generalized)  Rationale for Evaluation and Treatment: Rehabilitation  SUBJECTIVE:   SUBJECTIVE STATEMENT: Pt reports continuing to do exercises and driving is going well.    Pt accompanied by: self (significant other  dropped pt off - wife [name])  PERTINENT HISTORY: Bladder CA s/p surgery 2009, HTN, HLD, psoriatic arthritis, DMII, partial colectomy 2024   PRECAUTIONS: Other: colostomy bag  WEIGHT BEARING RESTRICTIONS: No  PAIN:  Are you having pain? No  FALLS: Has patient fallen in last 6 months? No  LIVING ENVIRONMENT: Lives with: lives with their spouse Lives in: House/apartment Stairs: Yes: Internal: full flight of steps to 2nd floor; bedroom and bathroom on 2nd floor steps; on left going up and External: 1 step in through the garage steps;   Has following equipment at home: None; seat in the shower but more to hold items   PLOF: Independent, Independent with basic ADLs, and Leisure: loves golfing  PATIENT GOALS: I want to feel back to normal  OBJECTIVE:  Note: Objective measures were completed at Evaluation unless otherwise noted.  HAND DOMINANCE: Right  ADLs: Transfers/ambulation related to ADLs: Little bit of challenge with balance, wife reports he walks different Eating: increased focus/effort when cutting foods Grooming: increased difficulty with coordination/strength when using L hand when shaving UB Dressing: increased difficulty with buttoning shirt LB Dressing: Mod I Toileting: Mod I Bathing: Mod I Tub Shower transfers: Supervision when bathing, walk-in shower Equipment: Walk in shower  IADLs:  Light housekeeping: wife completing majority, he will wash dishes occasionally and start the washing machine Meal Prep: helps spouse with aspects of meal prep, he will also make breakfast  Community mobility: wife does not want pt driving at this time, but no physician has told him not to drive Medication management: Mod I, utilizing pill box which he fills independently Financial management: shared with wife  MOBILITY STATUS:  reports that spouse states he walks differently now  POSTURE COMMENTS:  rounded shoulders, forward head, and slight forward trunk lean  ACTIVITY  TOLERANCE: Activity tolerance: WFL for tasks assessed on eval  FUNCTIONAL OUTCOME MEASURES: FOTO: 67, predicted 75 in 16 visits 07/12/23: FOTO 77 on visit 9  UPPER EXTREMITY ROM:  L shoulder droop compared to R  Active ROM Right eval Left eval Left 07/12/23  Shoulder flexion 128 113 128  Shoulder abduction     Shoulder adduction     Shoulder extension     Shoulder internal rotation     Shoulder external rotation     Elbow flexion     Elbow extension     Wrist flexion     Wrist extension     Wrist ulnar deviation     Wrist radial deviation     Wrist pronation     Wrist supination     (Blank rows = not tested)  UPPER EXTREMITY MMT:     MMT Right eval Left eval Left 07/12/23  Shoulder flexion 4+ 4- 4  Shoulder abduction     Shoulder adduction     Shoulder extension     Shoulder internal rotation     Shoulder external rotation     Middle trapezius     Lower trapezius  Elbow flexion     Elbow extension     Wrist flexion     Wrist extension     Wrist ulnar deviation     Wrist radial deviation     Wrist pronation     Wrist supination     (Blank rows = not tested)  HAND FUNCTION: Grip strength: Right: 82 lbs; Left: 65 lbs  COORDINATION: 9 Hole Peg test: Right: 28.72 sec; Left: 34.03 sec Box and Blocks:  Right 58 blocks, Left 47blocks (more gross grasp with LUE)  SENSATION: Light touch: Impaired ; reports numbness in thumb, index, and long finger on L hand  MUSCLE TONE: WNL bilaterally  COGNITION: Overall cognitive status: No family/caregiver present to determine baseline cognitive functioning; reports some difficulty with comprehension and attention with reading.  VISION: Subjective report: sometimes I have to "refocus" and notices intermittent dizziness Baseline vision: Wears glasses for reading only  VISION ASSESSMENT: To be further assessed in functional context  Patient has difficulty with following activities due to following visual impairments:  driving, reading, reports some difficulty with comprehension and attention with reading.  PRAXIS: Impaired: Motor planning  OBSERVATIONS: OT questions cognition and awareness of deficits.  Pt reports difficulty with driving and that his spouse currently does not want him driving.                                                                                                                             TREATMENT DATE:  07/17/23 Coordination: engaged in small peg board pattern replication with focus on coordination with picking up and manipulating small pegs into peg board with LUE.  OT increased challenge to attempting translation of pegs from palm to finger tips to remove from pegboard.  Pt with ability to translate from finger tips to palm, but increased difficulty with translating from palm back to finger tips.  Removed pegs one at a time until 10-15 in palm and then placing into container, pt with increased ease translating finger tips to palm. 9 hole peg test: L: 30.66 sec and 32.68 sec Grip strength: L: 80 #, R: 86# Clothespins: placing resistive clothespins 4,6,8# on vertical dowel to facilitate pinch strength and increased shoulder ROM.  Pt demonstrating good tolerance of increased shoulder flexion with LUE as well as sustained pinch when reaching overhead.    07/12/23 Grooved pegs: with LUE for increased coordination. Pt placed pegs with one at a time and removed with in hand manipulation. Pt completed with min difficulty with in hand manipulation, however no drops.   FOTO: 77 (up from 67) - still reporting mild difficulty with picking up coins from table with L hand GMC: stacking cups in 10 frame pyramid form to facilitate increased functional and sustained grasp as well as coordination with cups with LUE.  Pt initially demonstrating difficulty due to increased pace of task, however once pt slowed down and focused on movements pt with improved motor control.  Engaged in rotating of  cups, pt  with no issues from a shoulder or coordination standpoint.   Shoulder strengthening: engaged in strengthening with green theraband with shoulder extension, scapular retraction, diagonal chop, internal rotation, and external rotation from anchored position x10-15. OT providing tactile cue at L scapula to facilitate increased retraction.  OT then increased height of resistance with pt completing each exercise additional 10-15x with good technique.  Engaged in plank on wall with resistance band, slowly reaching L arm diagonally upward, then return to the plank position and repeat straight out to the side, then diagonally downward.  Pt with increased difficulty with last exercise with green theraband, therefore recommend continued use of red band with this final exercise.  OT reiterating education on modification of length of theraband to increase and decrease resistance. OT educating on focus on quality of movement.    07/10/23 9 hole peg test: Left: 35.88 seconds.  Pt reports decreased sensation, "numbness" impacting ease of picking up and manipulating items.   Pill box assessment: pt completed in >7 minutes with focus on coordination with opening pill bottles, pill box, and sensation as filling out pill box for the week.  Pt demonstrates poor multi-tasking and problem solving throughout task as initially filling out by day, requiring extraneous movements with opening pill slots and bottles repeatedly, then transitioned to filling out entire week by medication.  Pt making errors by filling out 3 pills in one slot vs "one pill 3 times a day".  Pt filled out in this manner x3 days before recognizing error.  Pt then able to verbalize error and how to correct it.   Sensation: engaged in desensitization strategies with focus on use of various textures with massage and gradually progressive pressure and various textures.  OT reiterating use of vision to compensate for impaired sensation.   Functional reach: engaged  in reaching across midline to moderate - high range to facilitate increased shoulder ROM.  Pt removing 4,6# resistive clothespins with LUE and placing onto lower surface.  Pt demonstrating improvements in functional reach.      PATIENT EDUCATION: Education details: ongoing condition specific education, with focus on [facility] and strengthening Person educated: Patient Education method: Explanation, Demonstration, and Handouts Education comprehension: verbalized understanding and needs further education  HOME EXERCISE PROGRAM: Access Code: [email or web address] URL: [email or web address] Date: 07/03/2023 (updated) Prepared by: [facility] - Outpatient  Rehab - [facility]  Program Notes Focus on quality of the movements.  Feel free to do less reps but more sets if that increases the quality.  Exercises - Seated Shoulder Shrugs  - 3 x weekly - 1-2 sets - 10 reps - 3-5 sec hold - Seated Scapular Retraction  - 3 x weekly - 2 sets - 10 reps - 3-5 sec hold - Shoulder Flexion with Resistance  - 3 x weekly - 2 sets - 10 reps - Seated Shoulder Extension with Resistance  - 3 x weekly - 2 sets - 10 reps - Scapular Retraction with Resistance  - 3 x weekly - 2 sets - 10 reps - Shoulder Internal Rotation with Resistance  - 3 x weekly - 2 sets - 10 reps - Shoulder External Rotation with Anchored Resistance  - 3 x weekly - 2 sets - 10 reps - Standing Diagonal Chop  - 3 x weekly - 2 sets - 10 reps - Shoulder extension with resistance - Neutral  - 3 x weekly - 2 sets - 10 reps - Standing Plank on Wall with Reaches and Resistance  -  3 x weekly - 2 sets - 10 reps  Access Code: [email or web address] URL: [email or web address] Date: 06/26/2023 Prepared by: [facility] - Outpatient  Rehab - [facility]  Exercises - Putty Squeezes  - 2 x daily - 10 reps - Rolling Putty on Table  - 2 x daily - Tip Pinch with Putty  - 2 x daily - 10 reps - Key Pinch with Putty  - 2 x daily - 5 reps - Finger  Lumbricals with Putty  - 2 x daily - 10 reps - Finger Pinch and Pull with Putty  - 2 x daily - 10 reps - Removing Marbles from Putty  - 2 x daily - 10 reps  GOALS: Goals reviewed with patient? Yes  SHORT TERM GOALS: Target date: 07/06/23  Pt will be independent with coordination HEP. Baseline: Goal status: MET - 07/03/23  2.  Pt will be independent with L shoulder/UE strengthening HEP. Baseline:  Goal status: MET - 07/03/23  3.  Pt will verbalize understanding of strategies to compensate for impaired sensation.  Baseline:  Goal status: MET - 07/03/23  4.  Pt will demonstrate improved UE functional use for ADLs as evidenced by increasing box/ blocks score by 4 blocks with LUE. Baseline: Right 58 blocks, Left 47blocks  Goal status: NOT MET - L: 49 blocks   LONG TERM GOALS: Target date: 07/27/23  Pt will demonstrate ability to retrieve a moderate weight object at 125* shoulder flexion to demonstrated increased postural control, shoulder flexion, and UE strength as needed to complete ADLs and IADLs. Baseline: 113* Goal status: IN PROGRESS  2.  Pt will demonstrate improved fine motor coordination for ADLs as evidenced by decreasing 9 hole peg test score for LUE by 4 secs. Baseline: Right: 28.72 sec; Left: 34.03 sec Goal status: IN PROGRESS  3.  Pt will improved L hand grip strength by 6# to demonstrate increased sustained grasp as needed for ADLs and IADLs  Baseline: Right: 82 lbs; Left: 65 lbs Goal status: MET - 75.5# on 07/03/23  4.  Pt will demonstrate ability to sequence simple functional task (simple snack prep, laundry task, etc) at Mod I level with good safety awareness Baseline:  Goal status: IN PROGRESS  5.  Pt will navigate a moderately busy environment, visually attending to surroundings and obstacles, while following multi-step commands with 90% accuracy. Baseline:  Goal status: IN PROGRESS  6.  Pt will verbalize understanding of safe return to driving  recommendations Baseline:  Goal status: MET - pt reports still having some eye strain but no veering on 07/12/23  ASSESSMENT:  CLINICAL IMPRESSION: Pt tolerating all tasks with good endurance and understanding of rationale.  Pt continues to demonstrate impaired coordination, impacted by impaired sensation.  Pt demonstrating improvements in shoulder ROM both during reaching activity and against resistance.  Pt will continue to benefit from skilled occupational therapy services to address ROM, coordination, sensation, and attention during multi-tasking.  PERFORMANCE DEFICITS: in functional skills including ADLs, IADLs, coordination, sensation, ROM, strength, pain, Fine motor control, Gross motor control, balance, body mechanics, endurance, decreased knowledge of precautions, decreased knowledge of use of DME, vision, and UE functional use, cognitive skills including safety awareness, sequencing, and understand, and psychosocial skills including coping strategies, environmental adaptation, and routines and behaviors.     PLAN:  OT FREQUENCY: 2x/week  OT DURATION: 6 weeks  PLANNED INTERVENTIONS: 97168 OT Re-evaluation, 97535 self care/ADL training, 16109 therapeutic exercise, 97530 therapeutic activity, 97112 neuromuscular re-education, 97140 manual therapy, 97035 ultrasound, [id number]  paraffin, 40981 moist heat, 97010 cryotherapy, 97032 electrical stimulation (manual), passive range of motion, balance training, functional mobility training, compression bandaging, visual/perceptual remediation/compensation, psychosocial skills training, energy conservation, coping strategies training, patient/family education, and DME and/or AE instructions  RECOMMENDED OTHER SERVICES: possible SLP  CONSULTED AND AGREED WITH PLAN OF CARE: Patient  PLAN FOR NEXT SESSION:  Review and add to coordination HEP,  further assess cognition and safety awareness - dual tasking activities,  quadruped and or WB through LUE in  standing due to back and knee issues   Rosalio Loud, OTR/L 07/18/2023, 10:33 AM   [facility] at [facility] [address] [place], [place] [address] Phone # [phone] Fax # [phone]

## 2023-07-17 NOTE — Telephone Encounter (Signed)
[employer] medicare Berkley Harvey: [id number] exp. 07/17/23-08/15/23 sent to GI [phone]

## 2023-07-19 ENCOUNTER — Ambulatory Visit: Payer: Medicare Other | Admitting: Occupational Therapy

## 2023-07-19 DIAGNOSIS — R278 Other lack of coordination: Secondary | ICD-10-CM

## 2023-07-19 DIAGNOSIS — R42 Dizziness and giddiness: Secondary | ICD-10-CM | POA: Diagnosis not present

## 2023-07-19 DIAGNOSIS — R208 Other disturbances of skin sensation: Secondary | ICD-10-CM

## 2023-07-19 DIAGNOSIS — R2681 Unsteadiness on feet: Secondary | ICD-10-CM | POA: Diagnosis not present

## 2023-07-19 DIAGNOSIS — M6281 Muscle weakness (generalized): Secondary | ICD-10-CM

## 2023-07-19 DIAGNOSIS — I69354 Hemiplegia and hemiparesis following cerebral infarction affecting left non-dominant side: Secondary | ICD-10-CM | POA: Diagnosis not present

## 2023-07-19 DIAGNOSIS — R2689 Other abnormalities of gait and mobility: Secondary | ICD-10-CM | POA: Diagnosis not present

## 2023-07-19 NOTE — Therapy (Addendum)
OUTPATIENT OCCUPATIONAL THERAPY NEURO  Treatment Note  Patient Name: Joshua [name] MRN: [id number] DOB:09-20-1955, 68 y.o., male Today's Date: 07/19/2023  PCP: Kristian Covey, MD REFERRING PROVIDER: Levert Feinstein, MD     END OF SESSION:    Visit Number 11    Number of Visits 13     Date for OT Re-Evaluation 07/27/23     Authorization Type BC[employer] Medicare     OT Start Time 1402     OT Stop Time 1445    OT Time Calculation (min) 43 min               Past Medical History:  Diagnosis Date   Anal fistula    Hidradenitis    History of bladder cancer 2009   urologist-- [name]---  s/p TURBT 2010,  recurrence in office 2014 ,  none since per pt   History of colonic polyps    History of COVID-19 04/2019   per pt had covid pneumonia recovered at home, no oxygen, symptoms resolved   History of diverticulitis of colon    History of hypertension 04/05/2009   per the pt he lost 55 lbs and htn has been reversed   History of kidney stones    Hyperlipidemia 04/05/2009   pt states he does not take med for cholesterol   OA (osteoarthritis) 04/05/2009   Psoriatic arthritis (HCC)    rheumonotologist--- [name]   Type 2 diabetes mellitus (HCC) 04/05/2009   followed by pcp---  (01-31-2021  per pt only checks blood sugar once weekly, last fasting sugar-- 122)   Past Surgical History:  Procedure Laterality Date   COLONOSCOPY     last one 11-04-2020  by gessner   EXTRACORPOREAL SHOCK WAVE LITHOTRIPSY  2015   INCISION AND DRAINAGE ABSCESS N/A 02/03/2021   Procedure: EXCISION OF PERIANAL AND PERINEAL HYDRADENITIS, INTERROGATION OF PERIANAL WOUNDS;  Surgeon: [name], MD;  Location: [facility];  Service: General;  Laterality: N/A;   PARTIAL COLECTOMY N/A 02/26/2023   Procedure: EXPLORATORY LAPAROTOMY; PARTIAL COLECTOMY; COLOSTOMY;  Surgeon: [name], MD;  Location: [facility];  Service: General;  Laterality: N/A;   RECTAL EXAM UNDER ANESTHESIA   02/03/2021   Procedure: ANORECTAL EXAM UNDER ANESTHESIA;  Surgeon: [name], MD;  Location: [facility];  Service: General;;   TONSILLECTOMY     child   TRANSURETHRAL RESECTION OF BLADDER TUMOR  02/2008   Patient Active Problem List   Diagnosis Date Noted   Cerebrovascular accident (CVA) (HCC) 06/07/2023   Protein-calorie malnutrition, severe 02/26/2023   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   Sigmoid diverticulitis 02/08/2023   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  Rationale for Evaluation and Treatment: Rehabilitation  SUBJECTIVE:   SUBJECTIVE STATEMENT: Pt reports "getting better every day".  I get super frustrated because I still lose my attention when I'm working on something and make little mistakes.    Pt accompanied by: self (significant other dropped pt off -  wife [name])  PERTINENT HISTORY: Bladder CA s/p surgery 2009, HTN, HLD, psoriatic arthritis, DMII, partial colectomy 2024   PRECAUTIONS: Other: colostomy bag  WEIGHT BEARING RESTRICTIONS: No  PAIN:  Are you having pain? Yes: NPRS scale: 3/10 Pain location: L shoulder Pain description: soreness, when reaching overhead Aggravating factors: might have overworked it with exercises this morning Relieving factors: not raising arm overhead  FALLS: Has patient fallen in last 6 months? No  LIVING ENVIRONMENT: Lives with: lives with their spouse Lives in:  House/apartment Stairs: Yes: Internal: full flight of steps to 2nd floor; bedroom and bathroom on 2nd floor steps; on left going up and External: 1 step in through the garage steps;   Has following equipment at home: None; seat in the shower but more to hold items   PLOF: Independent, Independent with basic ADLs, and Leisure: loves golfing  PATIENT GOALS: I want to feel back to normal  OBJECTIVE:  Note: Objective measures were completed at Evaluation unless otherwise noted.  HAND DOMINANCE: Right  ADLs: Transfers/ambulation related to ADLs: Little bit of challenge with balance, wife reports he walks different Eating: increased focus/effort when cutting foods Grooming: increased difficulty with coordination/strength when using L hand when shaving UB Dressing: increased difficulty with buttoning shirt LB Dressing: Mod I Toileting: Mod I Bathing: Mod I Tub Shower transfers: Supervision when bathing, walk-in shower Equipment: Walk in shower  IADLs:  Light housekeeping: wife completing majority, he will wash dishes occasionally and start the washing machine Meal Prep: helps spouse with aspects of meal prep, he will also make breakfast  Community mobility: wife does not want pt driving at this time, but no physician has told him not to drive Medication management: Mod I, utilizing pill box which he fills independently Financial management: shared with wife  MOBILITY STATUS:  reports that spouse states he walks differently now  POSTURE COMMENTS:  rounded shoulders, forward head, and slight forward trunk lean  ACTIVITY TOLERANCE: Activity tolerance: WFL for tasks assessed on eval  FUNCTIONAL OUTCOME MEASURES: FOTO: 67, predicted 75 in 16 visits 07/12/23: FOTO 77 on visit 9  UPPER EXTREMITY ROM:  L shoulder droop compared to R  Active ROM Right eval Left eval Left 07/12/23  Shoulder flexion 128 113 128  Shoulder abduction     Shoulder adduction     Shoulder extension      Shoulder internal rotation     Shoulder external rotation     Elbow flexion     Elbow extension     Wrist flexion     Wrist extension     Wrist ulnar deviation     Wrist radial deviation     Wrist pronation     Wrist supination     (Blank rows = not tested)  UPPER EXTREMITY MMT:     MMT Right eval Left eval Left 07/12/23  Shoulder flexion 4+ 4- 4  Shoulder abduction     Shoulder adduction     Shoulder extension     Shoulder internal rotation     Shoulder external rotation     Middle trapezius     Lower trapezius     Elbow flexion     Elbow extension     Wrist flexion     Wrist extension     Wrist ulnar deviation     Wrist radial deviation     Wrist pronation     Wrist supination     (Blank rows = not tested)  HAND FUNCTION: Grip  strength: Right: 82 lbs; Left: 65 lbs  COORDINATION: 9 Hole Peg test: Right: 28.72 sec; Left: 34.03 sec Box and Blocks:  Right 58 blocks, Left 47blocks (more gross grasp with LUE)  SENSATION: Light touch: Impaired ; reports numbness in thumb, index, and long finger on L hand  MUSCLE TONE: WNL bilaterally  COGNITION: Overall cognitive status: No family/caregiver present to determine baseline cognitive functioning; reports some difficulty with comprehension and attention with reading.  VISION: Subjective report: sometimes I have to "refocus" and notices intermittent dizziness Baseline vision: Wears glasses for reading only  VISION ASSESSMENT: To be further assessed in functional context  Patient has difficulty with following activities due to following visual impairments: driving, reading, reports some difficulty with comprehension and attention with reading.  PRAXIS: Impaired: Motor planning  OBSERVATIONS: OT questions cognition and awareness of deficits.  Pt reports difficulty with driving and that his spouse currently does not want him driving.                                                                                                                              TREATMENT DATE:  07/19/23 Pill box assessment: Pt completed pill box test with no errors, in 5:37 minutes.  Pt demonstrates good attention and organization to increase ease/independence with multi-tasking and probelm solving throughout task.  OT educated pt on setting up workspace for increased success with opening all slots first and organizing meds in order to increase ease with dispensing.  Also discussed decreasing external distractions as able (turning off TV, asking family/friend to hold the conversation to allow pt to focus, etc).  OT educating on functional carryover to meal prep and/or other routine tasks with focus on setup and sequencing to facilitate increased organization and safety. Planning/organizing: engaged in complex planning/problem solving prompt requiring pt to organize items into systematic schedule to complete all in time allotted. Pt demonstrating good sequencing and awareness of external factors, such as ice cream purchase as last stop to not melt in car.  Discussed pacing and prioritizing activities to ensure enough cognitive energy to complete tasks.  Pt initially not reading second paragraph which provided time parameters, therapist pointing out before pt beginning to write down sequence of tasks. Dual tasking: engaged in dual tasking activity at table top with focus on alternating attention from written list to images.  Pt demonstrating difficulty with sequencing, as he skipped down the list, omitting many items, with decreased awareness.  OT providing with cues for line follow with use of bookmark or finger to track sequence.  Pt requesting to attempt again at next session. Keeping thinking skills sharp: OT providing education and printout of recommendations of tasks to aid in keeping thinking skills sharp.  OT educating on Lumosity app with ability to customize programs to his needs.    07/17/23 Coordination: engaged in small peg board  pattern replication with focus on coordination with picking up and manipulating small pegs into peg board with  LUE.  OT increased challenge to attempting translation of pegs from palm to finger tips to remove from pegboard.  Pt with ability to translate from finger tips to palm, but increased difficulty with translating from palm back to finger tips.  Removed pegs one at a time until 10-15 in palm and then placing into container, pt with increased ease translating finger tips to palm. 9 hole peg test: L: 30.66 sec and 32.68 sec Grip strength: L: 80 #, R: 86# Clothespins: placing resistive clothespins 4,6,8# on vertical dowel to facilitate pinch strength and increased shoulder ROM.  Pt demonstrating good tolerance of increased shoulder flexion with LUE as well as sustained pinch when reaching overhead.    07/12/23 Grooved pegs: with LUE for increased coordination. Pt placed pegs with one at a time and removed with in hand manipulation. Pt completed with min difficulty with in hand manipulation, however no drops.   FOTO: 77 (up from 67) - still reporting mild difficulty with picking up coins from table with L hand GMC: stacking cups in 10 frame pyramid form to facilitate increased functional and sustained grasp as well as coordination with cups with LUE.  Pt initially demonstrating difficulty due to increased pace of task, however once pt slowed down and focused on movements pt with improved motor control.  Engaged in rotating of cups, pt with no issues from a shoulder or coordination standpoint.   Shoulder strengthening: engaged in strengthening with green theraband with shoulder extension, scapular retraction, diagonal chop, internal rotation, and external rotation from anchored position x10-15. OT providing tactile cue at L scapula to facilitate increased retraction.  OT then increased height of resistance with pt completing each exercise additional 10-15x with good technique.  Engaged in plank on wall  with resistance band, slowly reaching L arm diagonally upward, then return to the plank position and repeat straight out to the side, then diagonally downward.  Pt with increased difficulty with last exercise with green theraband, therefore recommend continued use of red band with this final exercise.  OT reiterating education on modification of length of theraband to increase and decrease resistance. OT educating on focus on quality of movement.   PATIENT EDUCATION: Education details: ongoing condition specific education, with focus on [facility] and strengthening Person educated: Patient Education method: Explanation, Demonstration, and Handouts Education comprehension: verbalized understanding and needs further education  HOME EXERCISE PROGRAM: Access Code: [email or web address] URL: [email or web address] Date: 07/03/2023 (updated) Prepared by: [facility] - Outpatient  Rehab - [facility]  Program Notes Focus on quality of the movements.  Feel free to do less reps but more sets if that increases the quality.  Exercises - Seated Shoulder Shrugs  - 3 x weekly - 1-2 sets - 10 reps - 3-5 sec hold - Seated Scapular Retraction  - 3 x weekly - 2 sets - 10 reps - 3-5 sec hold - Shoulder Flexion with Resistance  - 3 x weekly - 2 sets - 10 reps - Seated Shoulder Extension with Resistance  - 3 x weekly - 2 sets - 10 reps - Scapular Retraction with Resistance  - 3 x weekly - 2 sets - 10 reps - Shoulder Internal Rotation with Resistance  - 3 x weekly - 2 sets - 10 reps - Shoulder External Rotation with Anchored Resistance  - 3 x weekly - 2 sets - 10 reps - Standing Diagonal Chop  - 3 x weekly - 2 sets - 10 reps - Shoulder extension with resistance - Neutral  - 3  x weekly - 2 sets - 10 reps - Standing Plank on Wall with Reaches and Resistance  - 3 x weekly - 2 sets - 10 reps  Access Code: [email or web address] URL: [email or web address] Date: 06/26/2023 Prepared by: [facility] - Outpatient  Rehab -  [facility]  Exercises - Putty Squeezes  - 2 x daily - 10 reps - Rolling Putty on Table  - 2 x daily - Tip Pinch with Putty  - 2 x daily - 10 reps - Key Pinch with Putty  - 2 x daily - 5 reps - Finger Lumbricals with Putty  - 2 x daily - 10 reps - Finger Pinch and Pull with Putty  - 2 x daily - 10 reps - Removing Marbles from Putty  - 2 x daily - 10 reps  GOALS: Goals reviewed with patient? Yes  SHORT TERM GOALS: Target date: 07/06/23  Pt will be independent with coordination HEP. Baseline: Goal status: MET - 07/03/23  2.  Pt will be independent with L shoulder/UE strengthening HEP. Baseline:  Goal status: MET - 07/03/23  3.  Pt will verbalize understanding of strategies to compensate for impaired sensation.  Baseline:  Goal status: MET - 07/03/23  4.  Pt will demonstrate improved UE functional use for ADLs as evidenced by increasing box/ blocks score by 4 blocks with LUE. Baseline: Right 58 blocks, Left 47blocks  Goal status: NOT MET - L: 49 blocks   LONG TERM GOALS: Target date: 07/27/23  Pt will demonstrate ability to retrieve a moderate weight object at 125* shoulder flexion to demonstrated increased postural control, shoulder flexion, and UE strength as needed to complete ADLs and IADLs. Baseline: 113* Goal status: IN PROGRESS  2.  Pt will demonstrate improved fine motor coordination for ADLs as evidenced by decreasing 9 hole peg test score for LUE by 4 secs. Baseline: Right: 28.72 sec; Left: 34.03 sec Goal status: IN PROGRESS  3.  Pt will improved L hand grip strength by 6# to demonstrate increased sustained grasp as needed for ADLs and IADLs  Baseline: Right: 82 lbs; Left: 65 lbs Goal status: MET - 75.5# on 07/03/23  4.  Pt will demonstrate ability to sequence simple functional task (simple snack prep, laundry task, etc) at Mod I level with good safety awareness Baseline:  Goal status: IN PROGRESS  5.  Pt will navigate a moderately busy environment,  visually attending to surroundings and obstacles, while following multi-step commands with 90% accuracy. Baseline:  Goal status: IN PROGRESS  6.  Pt will verbalize understanding of safe return to driving recommendations Baseline:  Goal status: MET - pt reports still having some eye strain but no veering on 07/12/23  ASSESSMENT:  CLINICAL IMPRESSION: Pt demonstrating increased awareness of processing and recall errors.  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  Pt receptive to education on ways to decrease external distractions as well as activities to address attention and processing.  Pt tolerating all cognitive tasks this session with good understanding of rationale and carryover to function.  Pt will continue to benefit from skilled occupational therapy services to address ROM, coordination, sensation, and attention during multi-tasking.  PERFORMANCE DEFICITS: in functional skills including ADLs, IADLs, coordination, sensation, ROM, strength, pain, Fine motor control, Gross motor control, balance, body mechanics, endurance, decreased knowledge of precautions, decreased knowledge of use of DME, vision, and UE functional use, cognitive skills including safety awareness, sequencing, and understand, and psychosocial skills including coping strategies, environmental adaptation, and routines  and behaviors.     PLAN:  OT FREQUENCY: 2x/week  OT DURATION: 6 weeks  PLANNED INTERVENTIONS: 97168 OT Re-evaluation, 97535 self care/ADL training, 98119 therapeutic exercise, 97530 therapeutic activity, 97112 neuromuscular re-education, 97140 manual therapy, 97035 ultrasound, 97018 paraffin, 14782 moist heat, 97010 cryotherapy, 97032 electrical stimulation (manual), passive range of motion, balance training, functional mobility training, compression bandaging, visual/perceptual remediation/compensation, psychosocial skills training, energy conservation, coping strategies training,  patient/family education, and DME and/or AE instructions  RECOMMENDED OTHER SERVICES: possible SLP  CONSULTED AND AGREED WITH PLAN OF CARE: Patient  PLAN FOR NEXT SESSION:  Review and add to coordination HEP,  further assess cognition and safety awareness - dual tasking activities,  quadruped and or WB through LUE in standing due to back and knee issues  Gross motor: ball toss R<> L with cognitive dual tasking, repeat alternating attention picture (colored copy)   Rosalio Loud, OTR/L 07/19/2023, 12:53 PM   [facility] at [facility] [address], [address] [place], [place] [address] Phone # [phone] Fax # [phone]

## 2023-07-19 NOTE — Patient Instructions (Signed)
Keeping Thinking Skills Lambert Mody: Jigsaw puzzles Card/board games Talking on the phone/social events [email or web address] Online games Word searches/crossword puzzles Logic puzzles Aerobic exercise (stationary bike) Eating balanced diet (fruits and veggies) Drink water Try something new - new recipe, hobby Crafts Do a variety of activities that are challenging!

## 2023-07-24 ENCOUNTER — Ambulatory Visit: Payer: Medicare Other | Admitting: Occupational Therapy

## 2023-07-24 ENCOUNTER — Ambulatory Visit
Admission: RE | Admit: 2023-07-24 | Discharge: 2023-07-24 | Disposition: A | Discharge status: Home / Self Care | Payer: Medicare Other | Source: Ambulatory Visit | Attending: Neurology | Admitting: Neurology

## 2023-07-24 DIAGNOSIS — R2681 Unsteadiness on feet: Secondary | ICD-10-CM | POA: Diagnosis not present

## 2023-07-24 DIAGNOSIS — M6281 Muscle weakness (generalized): Secondary | ICD-10-CM | POA: Diagnosis not present

## 2023-07-24 DIAGNOSIS — I69354 Hemiplegia and hemiparesis following cerebral infarction affecting left non-dominant side: Secondary | ICD-10-CM | POA: Diagnosis not present

## 2023-07-24 DIAGNOSIS — R42 Dizziness and giddiness: Secondary | ICD-10-CM | POA: Diagnosis not present

## 2023-07-24 DIAGNOSIS — R278 Other lack of coordination: Secondary | ICD-10-CM

## 2023-07-24 DIAGNOSIS — R2689 Other abnormalities of gait and mobility: Secondary | ICD-10-CM | POA: Diagnosis not present

## 2023-07-24 DIAGNOSIS — R208 Other disturbances of skin sensation: Secondary | ICD-10-CM | POA: Diagnosis not present

## 2023-07-24 DIAGNOSIS — I639 Cerebral infarction, unspecified: Secondary | ICD-10-CM

## 2023-07-24 MED ORDER — IOPAMIDOL (ISOVUE-370) INJECTION 76%
75.0000 mL | Freq: Once | INTRAVENOUS | Status: AC | PRN
  Administered 2023-07-24: 75 mL via INTRAVENOUS

## 2023-07-24 NOTE — Therapy (Signed)
OUTPATIENT OCCUPATIONAL THERAPY NEURO  Treatment Note  Patient Name: Joshua [name] MRN: [id number] DOB:1956/05/14, 68 y.o., male Today's Date: 07/24/2023  PCP: Kristian Covey, MD REFERRING PROVIDER: Levert Feinstein, MD     END OF SESSION:  OT End of Session - 07/24/23 1321     Visit Number 12    Number of Visits 13    Date for OT Re-Evaluation 07/27/23    Authorization Type [employer] Medicare    OT Start Time 1233    OT Stop Time 1315    OT Time Calculation (min) 42 min              Visit Number 11    Number of Visits 13     Date for OT Re-Evaluation 07/27/23     Authorization Type [employer] Medicare     OT Start Time 1402     OT Stop Time 1445    OT Time Calculation (min) 43 min               Past Medical History:  Diagnosis Date   Anal fistula    Hidradenitis    History of bladder cancer 2009   urologist-- [name]---  s/p TURBT 2010,  recurrence in office 2014 ,  none since per pt   History of colonic polyps    History of COVID-19 04/2019   per pt had covid pneumonia recovered at home, no oxygen, symptoms resolved   History of diverticulitis of colon    History of hypertension 04/05/2009   per the pt he lost 55 lbs and htn has been reversed   History of kidney stones    Hyperlipidemia 04/05/2009   pt states he does not take med for cholesterol   OA (osteoarthritis) 04/05/2009   Psoriatic arthritis (HCC)    rheumonotologist--- [name]   Type 2 diabetes mellitus (HCC) 04/05/2009   followed by pcp---  (01-31-2021  per pt only checks blood sugar once weekly, last fasting sugar-- 122)   Past Surgical History:  Procedure Laterality Date   COLONOSCOPY     last one 11-04-2020  by gessner   EXTRACORPOREAL SHOCK WAVE LITHOTRIPSY  2015   INCISION AND DRAINAGE ABSCESS N/A 02/03/2021   Procedure: EXCISION OF PERIANAL AND PERINEAL HYDRADENITIS, INTERROGATION OF PERIANAL WOUNDS;  Surgeon: [name], MD;  Location: [facility];   Service: General;  Laterality: N/A;   PARTIAL COLECTOMY N/A 02/26/2023   Procedure: EXPLORATORY LAPAROTOMY; PARTIAL COLECTOMY; COLOSTOMY;  Surgeon: [name], MD;  Location: [facility];  Service: General;  Laterality: N/A;   RECTAL EXAM UNDER ANESTHESIA  02/03/2021   Procedure: ANORECTAL EXAM UNDER ANESTHESIA;  Surgeon: [name], MD;  Location: [facility];  Service: General;;   TONSILLECTOMY     child   TRANSURETHRAL RESECTION OF BLADDER TUMOR  02/2008   Patient Active Problem List   Diagnosis Date Noted   Cerebrovascular accident (CVA) (HCC) 06/07/2023   Protein-calorie malnutrition, severe 02/26/2023   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   Sigmoid diverticulitis 02/08/2023   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   Rectal bleeding 03/31/2020   Psoriatic arthritis (HCC) 02/21/2016   History of colonic polyp - sessile serrated polyp 03/12/2014   SHOULDER PAIN 01/05/2010   Malignant neoplasm of bladder (HCC) 04/05/2009   Type 2 diabetes mellitus (HCC) 04/05/2009   Hyperlipidemia 04/05/2009   Essential hypertension 04/05/2009   Osteoarthritis 04/05/2009    ONSET DATE: R MCA stroke in Dec 2024 (referral date 06/07/23)  REFERRING DIAG: I63.9 (ICD-10-CM) - Cerebrovascular accident (CVA), unspecified mechanism  THERAPY DIAG:  Hemiplegia and hemiparesis following cerebral infarction affecting left non-dominant side (HCC)  Other lack of coordination  Muscle weakness (generalized)  Unsteadiness on feet  Rationale for Evaluation and Treatment: Rehabilitation  SUBJECTIVE:   SUBJECTIVE STATEMENT: Pt reports enjoying with Lumosity app, finding that some "games" get him thinking.  Pt accompanied by: self (significant other dropped pt off - [name])  PERTINENT HISTORY: Bladder CA s/p surgery 2009, HTN, HLD, psoriatic arthritis, DMII, partial colectomy 2024   PRECAUTIONS: Other: colostomy bag  WEIGHT BEARING RESTRICTIONS: No  PAIN:  Are you having  pain? Yes: NPRS scale: 3/10 Pain location: L shoulder Pain description: soreness, when reaching overhead Aggravating factors: might have overworked it with exercises this morning Relieving factors: not raising arm overhead  FALLS: Has patient fallen in last 6 months? No  LIVING ENVIRONMENT: Lives with: lives with their spouse Lives in: House/apartment Stairs: Yes: Internal: full flight of steps to 2nd floor; bedroom and bathroom on 2nd floor steps; on left going up and External: 1 step in through the garage steps;   Has following equipment at home: None; seat in the shower but more to hold items   PLOF: Independent, Independent with basic ADLs, and Leisure: loves golfing  PATIENT GOALS: I want to feel back to normal  OBJECTIVE:  Note: Objective measures were completed at Evaluation unless otherwise noted.  HAND DOMINANCE: Right  ADLs: Transfers/ambulation related to ADLs: Little bit of challenge with balance, wife reports he walks different Eating: increased focus/effort when cutting foods Grooming: increased difficulty with coordination/strength when using L hand when shaving UB Dressing: increased difficulty with buttoning shirt LB Dressing: Mod I Toileting: Mod I Bathing: Mod I Tub Shower transfers: Supervision when bathing, walk-in shower Equipment: Walk in shower  IADLs:  Light housekeeping: wife completing majority, he will wash dishes occasionally and start the washing machine Meal Prep: helps spouse with aspects of meal prep, he will also make breakfast  Community mobility: wife does not want pt driving at this time, but no physician has told him not to drive Medication management: Mod I, utilizing pill box which he fills independently Financial management: shared with wife  MOBILITY STATUS:  reports that spouse states he walks differently now  POSTURE COMMENTS:  rounded shoulders, forward head, and slight forward trunk lean  ACTIVITY TOLERANCE: Activity  tolerance: WFL for tasks assessed on eval  FUNCTIONAL OUTCOME MEASURES: FOTO: 67, predicted 75 in 16 visits 07/12/23: FOTO 77 on visit 9  UPPER EXTREMITY ROM:  L shoulder droop compared to R  Active ROM Right eval Left eval Left 07/12/23  Shoulder flexion 128 113 128  Shoulder abduction     Shoulder adduction     Shoulder extension     Shoulder internal rotation     Shoulder external rotation     Elbow flexion     Elbow extension     Wrist flexion     Wrist extension     Wrist ulnar deviation     Wrist radial deviation     Wrist pronation     Wrist supination     (Blank rows = not tested)  UPPER EXTREMITY MMT:     MMT Right eval Left eval Left 07/12/23  Shoulder flexion 4+ 4- 4  Shoulder abduction     Shoulder adduction     Shoulder extension     Shoulder internal rotation     Shoulder external rotation  Middle trapezius     Lower trapezius     Elbow flexion     Elbow extension     Wrist flexion     Wrist extension     Wrist ulnar deviation     Wrist radial deviation     Wrist pronation     Wrist supination     (Blank rows = not tested)  HAND FUNCTION: Grip strength: Right: 82 lbs; Left: 65 lbs  COORDINATION: 9 Hole Peg test: Right: 28.72 sec; Left: 34.03 sec Box and Blocks:  Right 58 blocks, Left 47blocks (more gross grasp with LUE)  SENSATION: Light touch: Impaired ; reports numbness in thumb, index, and long finger on L hand  MUSCLE TONE: WNL bilaterally  COGNITION: Overall cognitive status: No family/caregiver present to determine baseline cognitive functioning; reports some difficulty with comprehension and attention with reading.  VISION: Subjective report: sometimes I have to "refocus" and notices intermittent dizziness Baseline vision: Wears glasses for reading only  VISION ASSESSMENT: To be further assessed in functional context  Patient has difficulty with following activities due to following visual impairments: driving, reading,  reports some difficulty with comprehension and attention with reading.  PRAXIS: Impaired: Motor planning  OBSERVATIONS: OT questions cognition and awareness of deficits.  Pt reports difficulty with driving and that his spouse currently does not want him driving.                                                                                                                             TREATMENT DATE:  07/24/23 Self-care: pt reports confusion about instruction for procedure this afternoon.  OT and pt discussed what led to confusion and how pt decided to proceed.  OT encouraged pt to f/u with MD in future if/when instructions may be ambiguous and/or confusing to pt. Dual tasking: engaged in dual tasking activity at table top with focus on alternating attention from written list to images.  Pt demonstrating improved sequencing by crossing off words in list as he located each, however still making 2 omission errors.  OT educating pt in this particular activity this is no correct sequence but that there may be a better sequence as compared to functional tasks.  Completed [address] in 1:34 with initial challenge with connecting by numbers but once OT reiterated instructions pt with ability to complete without any additional cues.  OT educating on reading entire instructions and/or asking for repeat instructions/clarification as needed to increase carryover and completion of tasks. Gross motor/cognitive dual tasking: engaged in ambulation while tossing ball and naming items in category of choice initially.  Pt with difficulty with naming books of the Bible while walking and tossing ball, requiring termination of walking and just naming items.  OT the modified task to having pt name states of the [place] that end in "letter A" while ambulating and tossing ball.  Pt with increased ability to complete this task, even stating that he is "flying over them"  as he was naming.  Pt still requiring increased time between naming  and correctly naming 9 of 21.      07/19/23 Pill box assessment: Pt completed pill box test with no errors, in 5:37 minutes.  Pt demonstrates good attention and organization to increase ease/independence with multi-tasking and probelm solving throughout task.  OT educated pt on setting up workspace for increased success with opening all slots first and organizing meds in order to increase ease with dispensing.  Also discussed decreasing external distractions as able (turning off TV, asking family/friend to hold the conversation to allow pt to focus, etc).  OT educating on functional carryover to meal prep and/or other routine tasks with focus on setup and sequencing to facilitate increased organization and safety. Planning/organizing: engaged in complex planning/problem solving prompt requiring pt to organize items into systematic schedule to complete all in time allotted. Pt demonstrating good sequencing and awareness of external factors, such as ice cream purchase as last stop to not melt in car.  Discussed pacing and prioritizing activities to ensure enough cognitive energy to complete tasks.  Pt initially not reading second paragraph which provided time parameters, therapist pointing out before pt beginning to write down sequence of tasks. Dual tasking: engaged in dual tasking activity at table top with focus on alternating attention from written list to images.  Pt demonstrating difficulty with sequencing, as he skipped down the list, omitting many items, with decreased awareness.  OT providing with cues for line follow with use of bookmark or finger to track sequence.  Pt requesting to attempt again at next session. Keeping thinking skills sharp: OT providing education and printout of recommendations of tasks to aid in keeping thinking skills sharp.  OT educating on Lumosity app with ability to customize programs to his needs.    07/17/23 Coordination: engaged in small peg board pattern replication  with focus on coordination with picking up and manipulating small pegs into peg board with LUE.  OT increased challenge to attempting translation of pegs from palm to finger tips to remove from pegboard.  Pt with ability to translate from finger tips to palm, but increased difficulty with translating from palm back to finger tips.  Removed pegs one at a time until 10-15 in palm and then placing into container, pt with increased ease translating finger tips to palm. 9 hole peg test: L: 30.66 sec and 32.68 sec Grip strength: L: 80 #, R: 86# Clothespins: placing resistive clothespins 4,6,8# on vertical dowel to facilitate pinch strength and increased shoulder ROM.  Pt demonstrating good tolerance of increased shoulder flexion with LUE as well as sustained pinch when reaching overhead.    PATIENT EDUCATION: Education details: ongoing condition specific education, with focus on [facility] and strengthening Person educated: Patient Education method: Explanation, Demonstration, and Handouts Education comprehension: verbalized understanding and needs further education  HOME EXERCISE PROGRAM: Access Code: [email or web address] URL: [email or web address] Date: 07/03/2023 (updated) Prepared by: [facility] - Outpatient  Rehab - [facility]  Program Notes Focus on quality of the movements.  Feel free to do less reps but more sets if that increases the quality.  Exercises - Seated Shoulder Shrugs  - 3 x weekly - 1-2 sets - 10 reps - 3-5 sec hold - Seated Scapular Retraction  - 3 x weekly - 2 sets - 10 reps - 3-5 sec hold - Shoulder Flexion with Resistance  - 3 x weekly - 2 sets - 10 reps - Seated Shoulder Extension with Resistance  - 3  x weekly - 2 sets - 10 reps - Scapular Retraction with Resistance  - 3 x weekly - 2 sets - 10 reps - Shoulder Internal Rotation with Resistance  - 3 x weekly - 2 sets - 10 reps - Shoulder External Rotation with Anchored Resistance  - 3 x weekly - 2 sets - 10 reps -  Standing Diagonal Chop  - 3 x weekly - 2 sets - 10 reps - Shoulder extension with resistance - Neutral  - 3 x weekly - 2 sets - 10 reps - Standing Plank on Wall with Reaches and Resistance  - 3 x weekly - 2 sets - 10 reps  Access Code: [email or web address] URL: [email or web address] Date: 06/26/2023 Prepared by: [facility] - Outpatient  Rehab - [facility]  Exercises - Putty Squeezes  - 2 x daily - 10 reps - Rolling Putty on Table  - 2 x daily - Tip Pinch with Putty  - 2 x daily - 10 reps - Key Pinch with Putty  - 2 x daily - 5 reps - Finger Lumbricals with Putty  - 2 x daily - 10 reps - Finger Pinch and Pull with Putty  - 2 x daily - 10 reps - Removing Marbles from Putty  - 2 x daily - 10 reps  GOALS: Goals reviewed with patient? Yes  SHORT TERM GOALS: Target date: 07/06/23  Pt will be independent with coordination HEP. Baseline: Goal status: MET - 07/03/23  2.  Pt will be independent with L shoulder/UE strengthening HEP. Baseline:  Goal status: MET - 07/03/23  3.  Pt will verbalize understanding of strategies to compensate for impaired sensation.  Baseline:  Goal status: MET - 07/03/23  4.  Pt will demonstrate improved UE functional use for ADLs as evidenced by increasing box/ blocks score by 4 blocks with LUE. Baseline: Right 58 blocks, Left 47blocks  Goal status: NOT MET - L: 49 blocks   LONG TERM GOALS: Target date: 07/27/23  Pt will demonstrate ability to retrieve a moderate weight object at 125* shoulder flexion to demonstrated increased postural control, shoulder flexion, and UE strength as needed to complete ADLs and IADLs. Baseline: 113* Goal status: IN PROGRESS  2.  Pt will demonstrate improved fine motor coordination for ADLs as evidenced by decreasing 9 hole peg test score for LUE by 4 secs. Baseline: Right: 28.72 sec; Left: 34.03 sec Goal status: IN PROGRESS  3.  Pt will improved L hand grip strength by 6# to demonstrate increased sustained grasp as  needed for ADLs and IADLs  Baseline: Right: 82 lbs; Left: 65 lbs Goal status: MET - 75.5# on 07/03/23  4.  Pt will demonstrate ability to sequence simple functional task (simple snack prep, laundry task, etc) at Mod I level with good safety awareness Baseline:  Goal status: IN PROGRESS  5.  Pt will navigate a moderately busy environment, visually attending to surroundings and obstacles, while following multi-step commands with 90% accuracy. Baseline:  Goal status: IN PROGRESS  6.  Pt will verbalize understanding of safe return to driving recommendations Baseline:  Goal status: MET - pt reports still having some eye strain but no veering on 07/12/23  ASSESSMENT:  CLINICAL IMPRESSION: Pt still recognizing mild processing and recall errors.  Pt requiring increased time with all dual tasking activities this session and receptive to education to increase sequencing and reduce errors.  Pt will continue to benefit from skilled occupational therapy services to address ROM, coordination, sensation, and attention during multi-tasking.  PERFORMANCE DEFICITS: in functional skills including ADLs, IADLs, coordination, sensation, ROM, strength, pain, Fine motor control, Gross motor control, balance, body mechanics, endurance, decreased knowledge of precautions, decreased knowledge of use of DME, vision, and UE functional use, cognitive skills including safety awareness, sequencing, and understand, and psychosocial skills including coping strategies, environmental adaptation, and routines and behaviors.     PLAN:  OT FREQUENCY: 2x/week  OT DURATION: 6 weeks  PLANNED INTERVENTIONS: 97168 OT Re-evaluation, 97535 self care/ADL training, 16109 therapeutic exercise, 97530 therapeutic activity, 97112 neuromuscular re-education, 97140 manual therapy, 97035 ultrasound, 97018 paraffin, 60454 moist heat, 97010 cryotherapy, 97032 electrical stimulation (manual), passive range of motion, balance training, functional  mobility training, compression bandaging, visual/perceptual remediation/compensation, psychosocial skills training, energy conservation, coping strategies training, patient/family education, and DME and/or AE instructions  RECOMMENDED OTHER SERVICES: possible SLP  CONSULTED AND AGREED WITH PLAN OF CARE: Patient  PLAN FOR NEXT SESSION:  Review cognitive dual tasking, Coordination task - D/C at next session.   Rosalio Loud, OTR/L 07/24/2023, 1:21 PM   [facility] [address] [place], [address] [place], [place] [address] Phone # [phone] Fax # [phone]

## 2023-07-26 ENCOUNTER — Ambulatory Visit: Payer: Medicare Other | Admitting: Occupational Therapy

## 2023-07-26 DIAGNOSIS — R2689 Other abnormalities of gait and mobility: Secondary | ICD-10-CM | POA: Diagnosis not present

## 2023-07-26 DIAGNOSIS — I69354 Hemiplegia and hemiparesis following cerebral infarction affecting left non-dominant side: Secondary | ICD-10-CM

## 2023-07-26 DIAGNOSIS — R278 Other lack of coordination: Secondary | ICD-10-CM | POA: Diagnosis not present

## 2023-07-26 DIAGNOSIS — R42 Dizziness and giddiness: Secondary | ICD-10-CM | POA: Diagnosis not present

## 2023-07-26 DIAGNOSIS — M6281 Muscle weakness (generalized): Secondary | ICD-10-CM | POA: Diagnosis not present

## 2023-07-26 DIAGNOSIS — R208 Other disturbances of skin sensation: Secondary | ICD-10-CM | POA: Diagnosis not present

## 2023-07-26 DIAGNOSIS — R2681 Unsteadiness on feet: Secondary | ICD-10-CM | POA: Diagnosis not present

## 2023-07-26 NOTE — Therapy (Signed)
OUTPATIENT OCCUPATIONAL THERAPY NEURO  Treatment Note & Discharge  Patient Name: Joshua [name] MRN: [id number] DOB:04/21/1956, 68 y.o., male Today's Date: 07/26/2023  PCP: Kristian Covey, MD REFERRING PROVIDER: Levert Feinstein, MD   OCCUPATIONAL THERAPY DISCHARGE SUMMARY  Visits from Start of Care: 13  Current functional level related to goals / functional outcomes: Pt made great progress towards LTGs, meeting 6 of 6 goals with significantly improved LUE ROM and strength, L hand grip strength and coordination, and vision.     Remaining deficits: Pt still with some difficulties with higher level cognition, particularly with multi-tasking - noting increased errors and forgetfulness.   Education / Equipment: Higher education careers adviser, memory strategies and activities to keep thinking skills sharp   Patient agrees to discharge. Patient goals were met. Patient is being discharged due to meeting the stated rehab goals..     END OF SESSION:  OT End of Session - 07/26/23 1331     Visit Number 13    Number of Visits 13    Date for OT Re-Evaluation 07/27/23    Authorization Type BCBS Medicare    OT Start Time 1315    OT Stop Time 1400    OT Time Calculation (min) 45 min                    Past Medical History:  Diagnosis Date   Anal fistula    Hidradenitis    History of bladder cancer 2009   urologist-- [name]---  s/p TURBT 2010,  recurrence in office 2014 ,  none since per pt   History of colonic polyps    History of COVID-19 04/2019   per pt had covid pneumonia recovered at home, no oxygen, symptoms resolved   History of diverticulitis of colon    History of hypertension 04/05/2009   per the pt he lost 55 lbs and htn has been reversed   History of kidney stones    Hyperlipidemia 04/05/2009   pt states he does not take med for cholesterol   OA (osteoarthritis) 04/05/2009   Psoriatic arthritis (HCC)    rheumonotologist--- [name]    Type 2 diabetes mellitus (HCC) 04/05/2009   followed by pcp---  (01-31-2021  per pt only checks blood sugar once weekly, last fasting sugar-- 122)   Past Surgical History:  Procedure Laterality Date   COLONOSCOPY     last one 11-04-2020  by gessner   EXTRACORPOREAL SHOCK WAVE LITHOTRIPSY  2015   INCISION AND DRAINAGE ABSCESS N/A 02/03/2021   Procedure: EXCISION OF PERIANAL AND PERINEAL HYDRADENITIS, INTERROGATION OF PERIANAL WOUNDS;  Surgeon: [name], MD;  Location: [facility];  Service: General;  Laterality: N/A;   PARTIAL COLECTOMY N/A 02/26/2023   Procedure: EXPLORATORY LAPAROTOMY; PARTIAL COLECTOMY; COLOSTOMY;  Surgeon: [name], MD;  Location: [facility];  Service: General;  Laterality: N/A;   RECTAL EXAM UNDER ANESTHESIA  02/03/2021   Procedure: ANORECTAL EXAM UNDER ANESTHESIA;  Surgeon: [name], MD;  Location: [facility];  Service: General;;   TONSILLECTOMY     child   TRANSURETHRAL RESECTION OF BLADDER TUMOR  02/2008   Patient Active Problem List   Diagnosis Date Noted   Cerebrovascular accident (CVA) (HCC) 06/07/2023   Protein-calorie malnutrition, severe 02/26/2023   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   Sigmoid diverticulitis 02/08/2023   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   Rectal bleeding 03/31/2020   Psoriatic arthritis (HCC) 02/21/2016   History of  colonic polyp - sessile serrated polyp 03/12/2014   SHOULDER PAIN 01/05/2010   Malignant neoplasm of bladder (HCC) 04/05/2009   Type 2 diabetes mellitus (HCC) 04/05/2009   Hyperlipidemia 04/05/2009   Essential hypertension 04/05/2009   Osteoarthritis 04/05/2009    ONSET DATE: R MCA stroke in Dec 2024 (referral date 06/07/23)  REFERRING DIAG: I63.9 (ICD-10-CM) - Cerebrovascular accident (CVA), unspecified mechanism  THERAPY DIAG:  Hemiplegia and hemiparesis following cerebral infarction affecting left non-dominant side (HCC)  Other lack of  coordination  Muscle weakness (generalized)  Rationale for Evaluation and Treatment: Rehabilitation  SUBJECTIVE:   SUBJECTIVE STATEMENT: Pt reports feeling like he still has some work to do, but feels that he has the resources and needs to keep working on them.  Pt accompanied by: self (significant other dropped pt off - wife [name])  PERTINENT HISTORY: Bladder CA s/p surgery 2009, HTN, HLD, psoriatic arthritis, DMII, partial colectomy 2024   PRECAUTIONS: Other: colostomy bag  WEIGHT BEARING RESTRICTIONS: No  PAIN:  Are you having pain? Yes: NPRS scale: 4/10 Pain location: R hip Pain description: soreness Aggravating factors: walking Relieving factors: rest  FALLS: Has patient fallen in last 6 months? No  LIVING ENVIRONMENT: Lives with: lives with their spouse Lives in: House/apartment Stairs: Yes: Internal: full flight of steps to 2nd floor; bedroom and bathroom on 2nd floor steps; on left going up and External: 1 step in through the garage steps;   Has following equipment at home: None; seat in the shower but more to hold items   PLOF: Independent, Independent with basic ADLs, and Leisure: loves golfing  PATIENT GOALS: I want to feel back to normal  OBJECTIVE:  Note: Objective measures were completed at Evaluation unless otherwise noted.  HAND DOMINANCE: Right  ADLs: Transfers/ambulation related to ADLs: Little bit of challenge with balance, wife reports he walks different Eating: increased focus/effort when cutting foods Grooming: increased difficulty with coordination/strength when using L hand when shaving UB Dressing: increased difficulty with buttoning shirt LB Dressing: Mod I Toileting: Mod I Bathing: Mod I Tub Shower transfers: Supervision when bathing, walk-in shower Equipment: Walk in shower  IADLs:  Light housekeeping: wife completing majority, he will wash dishes occasionally and start the washing machine Meal Prep: helps spouse with aspects of  meal prep, he will also make breakfast  Community mobility: wife does not want pt driving at this time, but no physician has told him not to drive Medication management: Mod I, utilizing pill box which he fills independently Financial management: shared with wife  MOBILITY STATUS:  reports that spouse states he walks differently now  POSTURE COMMENTS:  rounded shoulders, forward head, and slight forward trunk lean  ACTIVITY TOLERANCE: Activity tolerance: WFL for tasks assessed on eval  FUNCTIONAL OUTCOME MEASURES: FOTO: 67, predicted 75 in 16 visits 07/12/23: FOTO 77 on visit 9  UPPER EXTREMITY ROM:  L shoulder droop compared to R  Active ROM Right eval Left eval Left 07/12/23 Left 07/26/23  Shoulder flexion 128 113 128 138  Shoulder abduction      Shoulder adduction      Shoulder extension      Shoulder internal rotation      Shoulder external rotation      Elbow flexion      Elbow extension      Wrist flexion      Wrist extension      Wrist ulnar deviation      Wrist radial deviation      Wrist pronation  Wrist supination      (Blank rows = not tested)  UPPER EXTREMITY MMT:     MMT Right eval Left eval Left 07/12/23 Left 07/26/23  Shoulder flexion 4+ 4- 4 4+  Shoulder abduction      Shoulder adduction      Shoulder extension      Shoulder internal rotation      Shoulder external rotation      Middle trapezius      Lower trapezius      Elbow flexion      Elbow extension      Wrist flexion      Wrist extension      Wrist ulnar deviation      Wrist radial deviation      Wrist pronation      Wrist supination      (Blank rows = not tested)  HAND FUNCTION: Grip strength: Right: 82 lbs; Left: 65 lbs LEFT: 75.5# on 07/03/23  COORDINATION: 9 Hole Peg test: Right: 28.72 sec; Left: 34.03 sec Box and Blocks:  Right 58 blocks, Left 47blocks (more gross grasp with LUE)  07/26/23: Left: 30.19 sec and 26.75 sec (28.47 sec average)  SENSATION: Light touch:  Impaired ; reports numbness in thumb, index, and long finger on L hand  MUSCLE TONE: WNL bilaterally  COGNITION: Overall cognitive status: No family/caregiver present to determine baseline cognitive functioning; reports some difficulty with comprehension and attention with reading.  VISION: Subjective report: sometimes I have to "refocus" and notices intermittent dizziness Baseline vision: Wears glasses for reading only  VISION ASSESSMENT: To be further assessed in functional context  Patient has difficulty with following activities due to following visual impairments: driving, reading, reports some difficulty with comprehension and attention with reading.  PRAXIS: Impaired: Motor planning  OBSERVATIONS: OT questions cognition and awareness of deficits.  Pt reports difficulty with driving and that his spouse currently does not want him driving.                                                                                                                             TREATMENT DATE:  07/26/23 Measurements taken: see above. Self-care: pt reports doing laundry and washing dishes with "no problem".  Pt reports making his own breakfast and making no errors.  Pt does recognize that he has made increased errors when scheduling things, mixing up February and March dates. OT encouraged pt to establish "checks and balances" to have someone check behind him right now to decrease onset of errors. Gross motor/cognitive dual tasking: engaged in ambulation around cones and then alternating toe tapping on cones while finishing common sayings with pt having no error with this task.  OT then increased challenge to tossing ball while naming items of specific color.  Pt with increased challenge both with symmetry with tossing ball and naming items.  Pt with improved ease with naming animals in alphabetical order, still requiring increased time but able to complete  with min cues.    07/24/23 Self-care: pt  reports confusion about instruction for procedure this afternoon.  OT and pt discussed what led to confusion and how pt decided to proceed.  OT encouraged pt to f/u with MD in future if/when instructions may be ambiguous and/or confusing to pt. Dual tasking: engaged in dual tasking activity at table top with focus on alternating attention from written list to images.  Pt demonstrating improved sequencing by crossing off words in list as he located each, however still making 2 omission errors.  OT educating pt in this particular activity this is no correct sequence but that there may be a better sequence as compared to functional tasks.  Completed Trail [address] in 1:34 with initial challenge with connecting by numbers but once OT reiterated instructions pt with ability to complete without any additional cues.  OT educating on reading entire instructions and/or asking for repeat instructions/clarification as needed to increase carryover and completion of tasks. Gross motor/cognitive dual tasking: engaged in ambulation while tossing ball and naming items in category of choice initially.  Pt with difficulty with naming books of the Bible while walking and tossing ball, requiring termination of walking and just naming items.  OT the modified task to having pt name states of the [place] that end in "letter A" while ambulating and tossing ball.  Pt with increased ability to complete this task, even stating that he is "flying over them" as he was naming.  Pt still requiring increased time between naming and correctly naming 9 of 21.      07/19/23 Pill box assessment: Pt completed pill box test with no errors, in 5:37 minutes.  Pt demonstrates good attention and organization to increase ease/independence with multi-tasking and probelm solving throughout task.  OT educated pt on setting up workspace for increased success with opening all slots first and organizing meds in order to increase ease with dispensing.  Also discussed  decreasing external distractions as able (turning off TV, asking family/friend to hold the conversation to allow pt to focus, etc).  OT educating on functional carryover to meal prep and/or other routine tasks with focus on setup and sequencing to facilitate increased organization and safety. Planning/organizing: engaged in complex planning/problem solving prompt requiring pt to organize items into systematic schedule to complete all in time allotted. Pt demonstrating good sequencing and awareness of external factors, such as ice cream purchase as last stop to not melt in car.  Discussed pacing and prioritizing activities to ensure enough cognitive energy to complete tasks.  Pt initially not reading second paragraph which provided time parameters, therapist pointing out before pt beginning to write down sequence of tasks. Dual tasking: engaged in dual tasking activity at table top with focus on alternating attention from written list to images.  Pt demonstrating difficulty with sequencing, as he skipped down the list, omitting many items, with decreased awareness.  OT providing with cues for line follow with use of bookmark or finger to track sequence.  Pt requesting to attempt again at next session. Keeping thinking skills sharp: OT providing education and printout of recommendations of tasks to aid in keeping thinking skills sharp.  OT educating on Lumosity app with ability to customize programs to his needs.    PATIENT EDUCATION: Education details: ongoing condition specific education, with focus on cognitive dual tasking and tasks to continue to challenge cognition Person educated: Patient Education method: Explanation, Demonstration, and Handouts Education comprehension: verbalized understanding and needs further education  HOME EXERCISE  PROGRAM: Access Code: [email or web address] URL: [email or web address] Date: 07/03/2023 (updated) Prepared by: [facility] - Outpatient  Rehab - [facility]  Program Notes Focus on quality of the movements.  Feel free to do less reps but more sets if that increases the quality.  Exercises - Seated Shoulder Shrugs  - 3 x weekly - 1-2 sets - 10 reps - 3-5 sec hold - Seated Scapular Retraction  - 3 x weekly - 2 sets - 10 reps - 3-5 sec hold - Shoulder Flexion with Resistance  - 3 x weekly - 2 sets - 10 reps - Seated Shoulder Extension with Resistance  - 3 x weekly - 2 sets - 10 reps - Scapular Retraction with Resistance  - 3 x weekly - 2 sets - 10 reps - Shoulder Internal Rotation with Resistance  - 3 x weekly - 2 sets - 10 reps - Shoulder External Rotation with Anchored Resistance  - 3 x weekly - 2 sets - 10 reps - Standing Diagonal Chop  - 3 x weekly - 2 sets - 10 reps - Shoulder extension with resistance - Neutral  - 3 x weekly - 2 sets - 10 reps - Standing Plank on Wall with Reaches and Resistance  - 3 x weekly - 2 sets - 10 reps  Access Code: [email or web address] URL: [email or web address] Date: 06/26/2023 Prepared by: [facility] - Outpatient  Rehab - [facility]  Exercises - Putty Squeezes  - 2 x daily - 10 reps - Rolling Putty on Table  - 2 x daily - Tip Pinch with Putty  - 2 x daily - 10 reps - Key Pinch with Putty  - 2 x daily - 5 reps - Finger Lumbricals with Putty  - 2 x daily - 10 reps - Finger Pinch and Pull with Putty  - 2 x daily - 10 reps - Removing Marbles from Putty  - 2 x daily - 10 reps  GOALS: Goals reviewed with patient? Yes  SHORT TERM GOALS: Target date: 07/06/23  Pt will be independent with coordination HEP. Baseline: Goal status: MET - 07/03/23  2.  Pt will be independent with L shoulder/UE strengthening HEP. Baseline:  Goal status: MET - 07/03/23  3.  Pt will verbalize understanding of strategies to compensate for impaired sensation.  Baseline:  Goal status: MET - 07/03/23  4.  Pt will demonstrate improved UE functional use for ADLs as evidenced by increasing box/ blocks score by 4 blocks  with LUE. Baseline: Right 58 blocks, Left 47blocks  Goal status: NOT MET - L: 49 blocks   LONG TERM GOALS: Target date: 07/27/23  Pt will demonstrate ability to retrieve a moderate weight object at 125* shoulder flexion to demonstrated increased postural control, shoulder flexion, and UE strength as needed to complete ADLs and IADLs. Baseline: 113* Goal status: MET - 138* on 07/26/23  2.  Pt will demonstrate improved fine motor coordination for ADLs as evidenced by decreasing 9 hole peg test score for LUE by 4 secs. Baseline: Right: 28.72 sec; Left: 34.03 sec Goal status: MET - Left: 30.19 sec and 26.75 sec (28.47 sec average)  3.  Pt will improved L hand grip strength by 6# to demonstrate increased sustained grasp as needed for ADLs and IADLs  Baseline: Right: 82 lbs; Left: 65 lbs Goal status: MET - 75.5# on 07/03/23  4.  Pt will demonstrate ability to sequence simple functional task (simple snack prep, laundry task, etc) at Mod I level with good safety awareness Baseline:  Goal status: MET - 07/26/23  5.  Pt will navigate a moderately busy environment, visually attending to surroundings and obstacles, while following multi-step commands with 90% accuracy. Baseline:  Goal status: MET - 07/26/23  6.  Pt will verbalize understanding of safe return to driving recommendations Baseline:  Goal status: MET - pt reports still having some eye strain but no veering on 07/12/23  ASSESSMENT:  CLINICAL IMPRESSION: Pt still recognizing mild processing and recall errors but reports that he is utilizing Lumosity app and having someone check behind him to ensure decreased onset of errors.  Pt still requiring increased time with all dual tasking activities but able to complete with cued to slow down and focus on task.  Pt pleased with current status and recognizes progress in strength, ROM, and coordination.  Pt in agreement with D/C and plans to continue working on aspects of cognition with Lumosity  app.  PERFORMANCE DEFICITS: in functional skills including ADLs, IADLs, coordination, sensation, ROM, strength, pain, Fine motor control, Gross motor control, balance, body mechanics, endurance, decreased knowledge of precautions, decreased knowledge of use of DME, vision, and UE functional use, cognitive skills including safety awareness, sequencing, and understand, and psychosocial skills including coping strategies, environmental adaptation, and routines and behaviors.     PLAN:  OT FREQUENCY: 2x/week  OT DURATION: 6 weeks  PLANNED INTERVENTIONS: 97168 OT Re-evaluation, 97535 self care/ADL training, 16109 therapeutic exercise, 97530 therapeutic activity, 97112 neuromuscular re-education, 97140 manual therapy, 97035 ultrasound, 97018 paraffin, 60454 moist heat, 97010 cryotherapy, 97032 electrical stimulation (manual), passive range of motion, balance training, functional mobility training, compression bandaging, visual/perceptual remediation/compensation, psychosocial skills training, energy conservation, coping strategies training, patient/family education, and DME and/or AE instructions  RECOMMENDED OTHER SERVICES: possible SLP  CONSULTED AND AGREED WITH PLAN OF CARE: Patient   Rosalio Loud, OTR/L 07/26/2023, 1:31 PM   [facility] at [facility] [address], [address] [place], [place] [address] Phone # [phone] Fax # [phone]

## 2023-08-03 ENCOUNTER — Encounter: Payer: Self-pay | Admitting: Neurology

## 2023-08-14 DIAGNOSIS — K080 Exfoliation of teeth due to systemic causes: Secondary | ICD-10-CM | POA: Diagnosis not present

## 2023-08-29 DIAGNOSIS — K080 Exfoliation of teeth due to systemic causes: Secondary | ICD-10-CM | POA: Diagnosis not present

## 2023-08-30 DIAGNOSIS — L4050 Arthropathic psoriasis, unspecified: Secondary | ICD-10-CM | POA: Diagnosis not present

## 2023-08-31 DIAGNOSIS — E119 Type 2 diabetes mellitus without complications: Secondary | ICD-10-CM | POA: Diagnosis not present

## 2023-08-31 DIAGNOSIS — Z7984 Long term (current) use of oral hypoglycemic drugs: Secondary | ICD-10-CM | POA: Diagnosis not present

## 2023-08-31 DIAGNOSIS — H2513 Age-related nuclear cataract, bilateral: Secondary | ICD-10-CM | POA: Diagnosis not present

## 2023-08-31 LAB — HM DIABETES EYE EXAM

## 2023-09-12 NOTE — Progress Notes (Unsigned)
 Electrophysiology Office Note:    Date:  09/12/2023   ID:  NEELY CECENA, DOB 19-Aug-1955, MRN [id number]  [facility] Cardiologist:  None  [facility] Electrophysiologist:  Lanier Prude, MD   Referring MD: Levert Feinstein, MD   Chief Complaint: Cryptogenic stroke  History of Present Illness:    Mr. Crisp is a 68 year old man who I am seeing today for an evaluation of cryptogenic stroke at the request of Dr. Terrace Arabia.  The patient has a history of stroke, psoriatic arthritis, bladder cancer, diabetes, hyperlipidemia, hypertension and osteoarthritis.  The patient saw Dr. Terrace Arabia on June 07, 2023.  He had a right MCA stroke in December 2024.  Workup was not definitive as to the exact cause of the stroke.  He has no history of atrial fibrillation.  The neurologist is concerned about the possibility of a cardioembolic stroke and he is referred to discuss an implantable loop recorder for atrial fibrillation surveillance.      Their past medical, social and family history was reviewed.   ROS:   Please see the history of present illness.    All other systems reviewed and are negative.  EKGs/Labs/Other Studies Reviewed:    The following studies were reviewed today:  June 29, 2023 echo EF 60% RV normal No significant valvular disease  EKGs from November 2020 and September 2022 were reviewed and show sinus rhythm.  July 11, 2023 EKG shows sinus rhythm       Physical Exam:    VS:  There were no vitals taken for this visit.    Wt Readings from Last 3 Encounters:  07/11/23 186 lb 8 oz (84.6 kg)  06/18/23 177 lb (80.3 kg)  06/07/23 180 lb 8 oz (81.9 kg)     GEN: no distress CARD: RRR, No MRG RESP: No IWOB. CTAB.        ASSESSMENT AND PLAN:    No diagnosis found.   #Cryptogenic Stroke Pathophysiology of cryptogenic stroke was discussed in detail during today's clinic appointment. I discussed the role of loop recorder monitoring in patients who have suffered  a CVA/TIA. There has been no evidence of AF thus far in the patient's evaluation. Loop recorder monitors were discussed in detail including the implant procedure and its risks. I discussed the monthly monitoring costs associated with loop recorder monitoring. The patient would like to proceed with ILR implant.      Signed, Rossie Muskrat. Lalla Brothers, MD, [facility], [facility] 09/12/2023 7:47 PM    Electrophysiology [facility]   ----------------------  SURGEON:  Lanier Prude, MD     PREPROCEDURE DIAGNOSIS:  Cryptogenic stroke    POSTPROCEDURE DIAGNOSIS: Cryptogenic stroke     PROCEDURES:   1. Implantable loop recorder implantation    INTRODUCTION:  KEION NEELS presents with a history of cryptogenic stroke The costs of loop recorder monitoring have been discussed with the patient.    DESCRIPTION OF PROCEDURE:  Informed written consent was obtained.  A preprocedural timeout was performed with the RN Wenda Low). The patient required no sedation for the procedure today.  Mapping over the patient's chest was performed to identify the area where electrograms were most prominent for ILR recording.  This area was found to be the left parasternal region over the 4th intercostal space. The patients left chest was therefore prepped and draped in the usual sterile fashion. The skin overlying the left parasternal region was infiltrated with lidocaine for local analgesia.  A 0.5-cm incision was made over  the left parasternal region over the 3rd intercostal space.  A subcutaneous ILR pocket was fashioned using a combination of sharp and blunt dissection.  A {LOOPLIST:[id number]} implantable loop recorder was then placed into the pocket  R waves were very prominent and measured >0.50mV.  Steri- Strips and a sterile dressing were then applied.  There were no early apparent complications.     CONCLUSIONS:   1. Successful implantation of a implantable loop recorder for Cryptogenic stroke  2. No early  apparent complications.   Sheria Lang T. Lalla Brothers, MD, [facility], [facility] Cardiac Electrophysiology

## 2023-09-13 ENCOUNTER — Ambulatory Visit: Payer: Medicare Other | Attending: Cardiology | Admitting: Cardiology

## 2023-09-13 ENCOUNTER — Encounter: Payer: Self-pay | Admitting: Cardiology

## 2023-09-13 VITALS — BP 128/68 | HR 68 | Ht 68.0 in | Wt 186.6 lb

## 2023-09-13 DIAGNOSIS — I639 Cerebral infarction, unspecified: Secondary | ICD-10-CM

## 2023-09-13 NOTE — Patient Instructions (Addendum)
Medication Instructions:  Your physician recommends that you continue on your current medications as directed. Please refer to the Current Medication list given to you today.  Labwork: None ordered.  Testing/Procedures: None ordered.  Follow-Up:  As needed with Dr. Lalla Brothers  Implantable Loop Recorder Placement, Care After This sheet gives you information about how to care for yourself after your procedure. Your health care provider may also give you more specific instructions. If you have problems or questions, contact your health care provider. What can I expect after the procedure? After the procedure, it is common to have: Soreness or discomfort near the incision. Some swelling or bruising near the incision.  Follow these instructions at home: Incision care  Monitor your cardiac device site for redness, swelling, and drainage. Call the device clinic at [phone] if you experience these symptoms or fever/chills.  Keep the large square bandage on your site for 24 hours and then you may remove it yourself. Keep the steri-strips underneath in place.   You may shower after 72 hours / 3 days from your procedure with the steri-strips in place. They will usually fall off on their own, or may be removed after 10 days. Pat dry.   Avoid lotions, ointments, or perfumes over your incision until it is well-healed.  Please do not submerge in water until your site is completely healed.   Your device is MRI compatible.   Remote monitoring is used to monitor your cardiac device from home. This monitoring is scheduled every month by our office. It allows [place] to keep an eye on the function of your device to ensure it is working properly.  If your wound site starts to bleed apply pressure.    For help with the monitor please call [employer] Monitor Support Specialist directly at [phone].    If you have any questions/concerns please call the device clinic at  [phone].  Activity  Return to your normal activities.  General instructions Follow instructions from your health care provider about how to manage your implantable loop recorder and transmit the information. Learn how to activate a recording if this is necessary for your type of device. You may go through a metal detection gate, and you may let someone hold a metal detector over your chest. Show your ID card if needed. Do not have an MRI unless you check with your health care provider first. Take over-the-counter and prescription medicines only as told by your health care provider. Keep all follow-up visits as told by your health care provider. This is important. Contact a health care provider if: You have redness, swelling, or pain around your incision. You have a fever. You have pain that is not relieved by your pain medicine. You have triggered your device because of fainting (syncope) or because of a heartbeat that feels like it is racing, slow, fluttering, or skipping (palpitations). Get help right away if you have: Chest pain. Difficulty breathing. Summary After the procedure, it is common to have soreness or discomfort near the incision. Change your dressing as told by your health care provider. Follow instructions from your health care provider about how to manage your implantable loop recorder and transmit the information. Keep all follow-up visits as told by your health care provider. This is important. This information is not intended to replace advice given to you by your health care provider. Make sure you discuss any questions you have with your health care provider. Document Released: 05/03/2015 Document Revised: 07/07/2017 Document Reviewed: 07/07/2017 Elsevier Patient  Education  2020 Elsevier Inc.  

## 2023-09-19 ENCOUNTER — Encounter: Payer: Self-pay | Admitting: Neurology

## 2023-09-21 DIAGNOSIS — K5732 Diverticulitis of large intestine without perforation or abscess without bleeding: Secondary | ICD-10-CM | POA: Diagnosis not present

## 2023-09-25 ENCOUNTER — Encounter: Payer: Self-pay | Admitting: Family Medicine

## 2023-10-08 ENCOUNTER — Ambulatory Visit: Payer: Medicare Other | Admitting: Family Medicine

## 2023-10-09 ENCOUNTER — Other Ambulatory Visit: Payer: Medicare Other

## 2023-10-09 DIAGNOSIS — R972 Elevated prostate specific antigen [PSA]: Secondary | ICD-10-CM

## 2023-10-09 LAB — PSA: PSA: 1.72 ng/mL (ref 0.10–4.00)

## 2023-10-12 ENCOUNTER — Ambulatory Visit (INDEPENDENT_AMBULATORY_CARE_PROVIDER_SITE_OTHER): Admitting: Family Medicine

## 2023-10-12 VITALS — BP 132/74 | HR 71 | Temp 97.7°F | Wt 189.9 lb

## 2023-10-12 DIAGNOSIS — E1165 Type 2 diabetes mellitus with hyperglycemia: Secondary | ICD-10-CM

## 2023-10-12 DIAGNOSIS — R058 Other specified cough: Secondary | ICD-10-CM | POA: Diagnosis not present

## 2023-10-12 DIAGNOSIS — E785 Hyperlipidemia, unspecified: Secondary | ICD-10-CM | POA: Diagnosis not present

## 2023-10-12 DIAGNOSIS — Z7984 Long term (current) use of oral hypoglycemic drugs: Secondary | ICD-10-CM

## 2023-10-12 DIAGNOSIS — I10 Essential (primary) hypertension: Secondary | ICD-10-CM | POA: Diagnosis not present

## 2023-10-12 DIAGNOSIS — Z7985 Long-term (current) use of injectable non-insulin antidiabetic drugs: Secondary | ICD-10-CM

## 2023-10-12 LAB — HEPATIC FUNCTION PANEL
ALT: 32 U/L (ref 0–53)
AST: 22 U/L (ref 0–37)
Albumin: 4.5 g/dL (ref 3.5–5.2)
Alkaline Phosphatase: 107 U/L (ref 39–117)
Bilirubin, Direct: 0.1 mg/dL (ref 0.0–0.3)
Total Bilirubin: 0.6 mg/dL (ref 0.2–1.2)
Total Protein: 8.7 g/dL — ABNORMAL HIGH (ref 6.0–8.3)

## 2023-10-12 LAB — POCT GLYCOSYLATED HEMOGLOBIN (HGB A1C): Hemoglobin A1C: 8.4 % — AB (ref 4.0–5.6)

## 2023-10-12 LAB — LIPID PANEL
Cholesterol: 151 mg/dL (ref 0–200)
HDL: 46.3 mg/dL (ref >39.00)
LDL Cholesterol: 78 mg/dL (ref 0–99)
NonHDL: 104.54
Total CHOL/HDL Ratio: 3
Triglycerides: 131 mg/dL (ref 0.0–149.0)
VLDL: 26.2 mg/dL (ref 0.0–40.0)

## 2023-10-12 MED ORDER — ROSUVASTATIN CALCIUM 10 MG PO TABS: 10.0000 mg | ORAL_TABLET | Freq: Every day | ORAL | 0 refills | Status: DC

## 2023-10-12 MED ORDER — DOXYCYCLINE HYCLATE 100 MG PO CAPS: 100.0000 mg | ORAL_CAPSULE | Freq: Two times a day (BID) | ORAL | 0 refills | Status: DC

## 2023-10-12 MED ORDER — OZEMPIC (0.25 OR 0.5 MG/DOSE) 2 MG/3ML ~~LOC~~ SOPN: 0.2500 mg | PEN_INJECTOR | SUBCUTANEOUS | 2 refills | Status: DC

## 2023-10-12 NOTE — Patient Instructions (Addendum)
 Start the Ozempic 0.25 subcutaneous once weekly for one month and then increase to 0.5 mg once weekly until follow up.    Set up 3 month follow up.

## 2023-10-12 NOTE — Addendum Note (Signed)
 Addended by: Aurelio Leer on: 10/12/2023 02:13 PM   Modules accepted: Orders

## 2023-10-12 NOTE — Progress Notes (Signed)
 Established Patient Office Visit  Subjective   Patient ID: Joshua [name], male    DOB: [birth year]  Age: 68 y.o. MRN: [id number]  Chief Complaint  Patient presents with   Medical Management of Chronic Issues    HPI   Joshua [name] is seen for medical follow-up.  He has history of hypertension, CVA, hyperlipidemia, type 2 diabetes, psoriatic arthritis, bladder cancer.  Had recent loop recorder placement.  His last A1c was 8.3%.  He is currently on Jardiance .  He has been very diligent with regard to diet and not monitoring blood sugars regularly currently.  Previous intolerance with metformin .  He had significant GI side effects with immediate and extended release.  Has never been on GLP-1 medication.  He does take low-dose statin with Crestor  5 mg daily.  Also taking aspirin.  Has not had lipid panel since starting the Crestor .  Last LDL cholesterol last November 105  He also relates acute issue of productive cough for past week and a half.  His main concern is that he takes Remicade  and knows he has high risk of pneumonia.  Cough is productive.  No fever.  No dyspnea.  Past Medical History:  Diagnosis Date   Anal fistula    Hidradenitis    History of bladder cancer 2009   urologist-- dr [name]---  s/p TURBT 2010,  recurrence in office 2014 ,  none since per pt   History of colonic polyps    History of COVID-19 04/2019   per pt had covid pneumonia recovered at home, no oxygen, symptoms resolved   History of diverticulitis of colon    History of hypertension 04/05/2009   per the pt he lost 55 lbs and htn has been reversed   History of kidney stones    Hyperlipidemia 04/05/2009   pt states he does not take med for cholesterol   OA (osteoarthritis) 04/05/2009   Psoriatic arthritis (HCC)    rheumonotologist--- dr a. Meredith Stalls   Type 2 diabetes mellitus (HCC) 04/05/2009   followed by pcp---  (01-31-2021  per pt only checks blood sugar once weekly, last fasting sugar-- 122)   Past  Surgical History:  Procedure Laterality Date   COLONOSCOPY     last one 11-04-2020  by gessner   EXTRACORPOREAL SHOCK WAVE LITHOTRIPSY  2015   INCISION AND DRAINAGE ABSCESS N/A 02/03/2021   Procedure: EXCISION OF PERIANAL AND PERINEAL HYDRADENITIS, INTERROGATION OF PERIANAL WOUNDS;  Surgeon: [name], MD;  Location: [facility];  Service: General;  Laterality: N/A;   PARTIAL COLECTOMY N/A 02/26/2023   Procedure: EXPLORATORY LAPAROTOMY; PARTIAL COLECTOMY; COLOSTOMY;  Surgeon: [name], MD;  Location: [facility];  Service: General;  Laterality: N/A;   RECTAL EXAM UNDER ANESTHESIA  02/03/2021   Procedure: ANORECTAL EXAM UNDER ANESTHESIA;  Surgeon: [name], MD;  Location: [facility];  Service: General;;   TONSILLECTOMY     child   TRANSURETHRAL RESECTION OF BLADDER TUMOR  02/2008    reports that he has been smoking cigarettes and cigars. He has never used smokeless tobacco. He reports that he does not currently use alcohol. He reports that he does not use drugs. family history includes Arthritis in his father; Crohn's disease in his daughter; Diabetes in his mother; Hypertension in his mother. Allergies  Allergen Reactions   Adalimumab Other (See Comments)   Metformin  And Related Other (See Comments)   Penicillin G Other (See Comments)   Penicillins Hives    And fever  blisters    Review of Systems  Constitutional:  Negative for chills and fever.  Respiratory:  Positive for cough and sputum production. Negative for hemoptysis and shortness of breath.   Cardiovascular:  Negative for chest pain.      Objective:      BP 132/74 (BP Location: Left Arm, Patient Position: Sitting, Cuff Size: Normal)   Pulse 71   Temp 97.7 F (36.5 C) (Oral)   Wt 189 lb 14.4 oz (86.1 kg)   SpO2 96%   BMI 28.87 kg/m  BP Readings from Last 3 Encounters:  10/12/23 132/74  09/13/23 128/68  07/11/23 126/66   Wt Readings from Last 3 Encounters:   10/12/23 189 lb 14.4 oz (86.1 kg)  09/13/23 186 lb 9.6 oz (84.6 kg)  07/11/23 186 lb 8 oz (84.6 kg)      Physical Exam Vitals reviewed.  Constitutional:      General: He is not in acute distress.    Appearance: He is not ill-appearing.  Cardiovascular:     Rate and Rhythm: Normal rate and regular rhythm.  Pulmonary:     Comments: No rales.  A few faint wheezes.  Symmetric breath sounds.  O2 sat 96% room air. Musculoskeletal:     Right lower leg: No edema.     Left lower leg: No edema.  Neurological:     Mental Status: He is alert.      Results for orders placed or performed in visit on 10/12/23  POC HgB A1c  Result Value Ref Range   Hemoglobin A1C 8.4 (A) 4.0 - 5.6 %   HbA1c POC (<> result, manual entry)     HbA1c, POC (prediabetic range)     HbA1c, POC (controlled diabetic range)        The ASCVD Risk score (Arnett DK, et al., 2019) failed to calculate for the following reasons:   Risk score cannot be calculated because patient has a medical history suggesting prior/existing ASCVD    Assessment & Plan:   #1 type 2 diabetes suboptimally controlled with A1c 8.4%.  Patient already on Jardiance  10 mg daily.  Has been intolerant of metformin  previously.  We did discuss possible addition of GLP-1 medication and decided to try Ozempic 0.25 mg subcutaneous once weekly for 1 month and then if tolerating well increase to 0.5 mg subcutaneous once weekly until follow-up in 3 months.   Patient already following relatively low glycemic diet.  He has good knowledge regarding diet and diabetes  #2 hyperlipidemia.  Patient on low-dose Crestor .  Goal LDL less than 70.  Recheck lipid and hepatic  #3 productive cough.  Duration 1 and half weeks.  No respiratory distress.  Afebrile.  Even though this may be viral does have high risk with chronic immunosuppressant use.  We elected to go and cover with doxycycline  100 mg twice daily for 7 days.  Follow-up immediately for any fever or  increased shortness of breath  Return in about 3 months (around 01/12/2024).    [name], MD

## 2023-10-18 ENCOUNTER — Ambulatory Visit (INDEPENDENT_AMBULATORY_CARE_PROVIDER_SITE_OTHER)

## 2023-10-18 DIAGNOSIS — I639 Cerebral infarction, unspecified: Secondary | ICD-10-CM

## 2023-10-19 LAB — CUP PACEART REMOTE DEVICE CHECK
Date Time Interrogation Session: 20250515190959
Implantable Pulse Generator Implant Date: 20250410

## 2023-10-21 ENCOUNTER — Ambulatory Visit: Payer: Self-pay | Admitting: Cardiology

## 2023-10-25 DIAGNOSIS — L4050 Arthropathic psoriasis, unspecified: Secondary | ICD-10-CM | POA: Diagnosis not present

## 2023-11-06 ENCOUNTER — Ambulatory Visit: Admitting: Adult Health

## 2023-11-06 ENCOUNTER — Encounter: Payer: Self-pay | Admitting: Adult Health

## 2023-11-06 ENCOUNTER — Telehealth: Payer: Self-pay

## 2023-11-06 VITALS — BP 129/72 | HR 74 | Ht 68.0 in | Wt 194.0 lb

## 2023-11-06 DIAGNOSIS — K5732 Diverticulitis of large intestine without perforation or abscess without bleeding: Secondary | ICD-10-CM | POA: Diagnosis not present

## 2023-11-06 DIAGNOSIS — I63411 Cerebral infarction due to embolism of right middle cerebral artery: Secondary | ICD-10-CM

## 2023-11-06 DIAGNOSIS — Z933 Colostomy status: Secondary | ICD-10-CM | POA: Diagnosis not present

## 2023-11-06 MED ORDER — EMPAGLIFLOZIN 10 MG PO TABS
10.0000 mg | ORAL_TABLET | Freq: Every day | ORAL | 1 refills | Status: DC
Start: 2023-11-06 — End: 2024-02-06

## 2023-11-06 NOTE — Progress Notes (Signed)
 Chief Complaint  Patient presents with   Cerebrovascular Accident    Rm 3 with spouse  Pt is well and stable. Reports no new CVA concerns       ASSESSMENT AND PLAN  Joshua [name] is a 68 y.o. 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,  Making great recovery with only mild left hand decreased dexterity and mild hand numbness, discussed typical recovery time  S/p ILR 09/2023 - monitored by cardiology   Continue aspirin 81 mg daily and Crestor  10 mg daily for secondary stroke prevention managed/prescribed by PCP  Continue close PCP f/u for aggressive stroke risk factor management including HLD with LDL goal<70 and DM with A1c.<7   Continue to follow with PCP for stroke risk factor management   Surgical clearance   As he is 6 months post stroke, okay to proceed with surgical procedure with small but acceptable risk of recurrent stroke while off aspirin therapy, can hold aspirin 3-5 days or as requested by surgeon and restart immediately after or once cleared by surgeon.     No further recommendations from stroke standpoint. Advised to follow up on an as needed basis          DIAGNOSTIC DATA (LABS, IMAGING, TESTING) - I reviewed patient records, labs, notes, testing and imaging myself where available.   MEDICAL HISTORY:  Update 11/06/2023 JM: Patient returns for follow-up visit accompanied by his wife.  Overall stable without new stroke/TIA symptoms.  Reports great improvement of left sided symptoms since prior visit. Has mild inner left hand numbness but gradually improving. Denies any residual weakness. Completed therapies back in Feb meeting all goals. He remains active, goes to the gym routinely, maintains ADLs and IADLs independently. Does report bilateral hand stiffness but believes more due to psoriatic arthritis.   Reports compliance on aspirin and Crestor  without side effects.  Routinely follows with PCP for stroke risk factor management.  Recent lab work showed A1c 8.4  and LDL 78, PCP recommended increasing Crestor  from 5 mg to 10 mg daily and was started on Ozempic  for suboptimally controlled DM.  S/p ILR 09/2023. Carotid ultrasound only minimal wall thickening or plaque. 2D echo 60-65%   Plans on having reverse colostomy hopefully by the end of the month or July, has f/u with gastro Friday, needs to have colonoscopy prior to surgery. He questions clearance of pursing from stroke standpoint.      Consult visit 06/07/2023 Dr. Gracie Lav: Joshua [name] is a 68 year old right-handed male accompanied by his wife, seen in request by his primary care doctor [name] for evaluation of stroke by MRI, initial evaluation June 07, 2023    History is obtained from the patient and review of electronic medical records. I personally reviewed pertinent available imaging films in PACS.   PMHx of DM since 2016 Psoriatic arthritis  Bladder cancer,  Hx of kidney stone HLD Diverticulitis,   He presenting with sudden onset left arm weakness numbness wake up from sleep on May 09, 2023, thought it was due to his left shoulder injection 2 weeks ago, wife also noted mild left facial droop, the same day while driving, he described difficulty keep in the laying, tends to veer towards the left side, mild confusion, have hard time operating his computer and cell phone,  He was seen by primary care eventually, had MRI of the brain on May 26, 2023, showed subacute right MCA infarction with petechiae hemorrhage in the right frontal lobe, no mass effect  His left side difficulty has regained some recovery still have left arm numbness and weakness, mild gait abnormality he contributed to his low back pain  He has vascular risk factor of diabetes, mild hyperlipidemia LDL was 105,  In September 2024, he suffered severe case of diverticulitis, required partial colectomy, colostomy, prolonged antibiotic treatment  Laboratory evaluation in November 2024 A1c 6.8, LDL 105,  normal CBC, hemoglobin of 14.5, CMP creatinine 0.84,    PHYSICAL EXAM:   Vitals:   11/06/23 1358  BP: 129/72  Pulse: 74  Weight: 194 lb (88 kg)  Height: 5\' 8"  (1.727 m)   Body mass index is 29.5 kg/m.   PHYSICAL EXAMNIATION: Gen: NAD, very pleasant middle aged male, conversant, well nourised, well groomed                     Cardiovascular: Regular rate rhythm, no peripheral edema, warm, nontender. Eyes: Conjunctivae clear without exudates or hemorrhage Neck: Supple, no carotid bruits. Pulmonary: Clear to auscultation bilaterally   NEUROLOGICAL EXAM:  MENTAL STATUS: Speech/cognition: Awake, alert, oriented to history taking and casual conversation CRANIAL NERVES: CN II: Visual fields are full to confrontation. Pupils are round equal and briskly reactive to light. CN III, IV, VI: extraocular movement are normal. No ptosis. CN V: Facial sensation is intact to light touch CN VII: Mild shallow left nasolabial fold, more evident while smiling  CN VIII: Hearing is normal to causal conversation. CN IX, X: Phonation is normal. CN XI: Head turning and shoulder shrug are intact  MOTOR: full strength in all tested extremities except slightly decreased left hand dexterity   REFLEXES: 1+ and symmetric in all extremities   SENSORY: Intact to light touch, pinprick and vibratory sensation are intact in fingers and toes.  COORDINATION: There is no trunk or limb dysmetria noted.  GAIT/STANCE: stands from seated position with arms crossed, leaning forward, mildly antalgic, able to heel toe and tandem walk with mild difficulty      REVIEW OF SYSTEMS:  Full 14 system review of systems performed and notable only for as above All other review of systems were negative.   ALLERGIES: Allergies  Allergen Reactions   Adalimumab Other (See Comments)   Metformin  And Related Other (See Comments)   Penicillin G Other (See Comments)   Penicillins Hives    And fever blisters    HOME  MEDICATIONS: Current Outpatient Medications  Medication Sig Dispense Refill   acetaminophen  (TYLENOL ) 500 MG tablet Take 2 tablets (1,000 mg total) by mouth every 6 (six) hours as needed. 30 tablet 0   aspirin 81 MG chewable tablet Chew 81 mg by mouth daily.     BAYER MICROLET LANCETS lancets Check Blood Sugar Once Daily. 30 each 5   betamethasone  dipropionate 0.05 % lotion Apply topically daily.     clindamycin (CLEOCIN T) 1 % lotion Apply 1 application  topically daily.     doxycycline  (VIBRAMYCIN ) 100 MG capsule Take 1 capsule (100 mg total) by mouth 2 (two) times daily. 14 capsule 0   empagliflozin  (JARDIANCE ) 10 MG TABS tablet Take 1 tablet (10 mg total) by mouth daily. 90 tablet 1   inFLIXimab -axxq (AVSOLA ) 100 MG injection 100 mg by Intravenous (Continuous Infusion) route See admin instructions. GENERIC Infusion every 8 weeks     ONETOUCH ULTRA test strip USE AS DIRECTED TO CHECK BLOOD GLUCOSE ONE TIME DAILY 100 strip 10   polyethylene glycol (MIRALAX  / GLYCOLAX ) 17 g packet Take 17 g by mouth daily  as needed for mild constipation. 14 each 0   rosuvastatin  (CRESTOR ) 10 MG tablet Take 1 tablet (10 mg total) by mouth daily. 90 tablet 0   Semaglutide ,0.25 or 0.5MG /DOS, (OZEMPIC , 0.25 OR 0.5 MG/DOSE,) 2 MG/3ML SOPN Inject 0.25 mg into the skin once a week. 3 mL 2   triamcinolone  cream (KENALOG) 0.1 % Apply 1 Application topically 2 (two) times daily as needed.     No current facility-administered medications for this visit.    PAST MEDICAL HISTORY: Past Medical History:  Diagnosis Date   Anal fistula    Hidradenitis    History of bladder cancer 2009   urologist-- dr [name]---  s/p TURBT 2010,  recurrence in office 2014 ,  none since per pt   History of colonic polyps    History of COVID-19 04/2019   per pt had covid pneumonia recovered at home, no oxygen, symptoms resolved   History of diverticulitis of colon    History of hypertension 04/05/2009   per the pt he lost 55 lbs and  htn has been reversed   History of kidney stones    Hyperlipidemia 04/05/2009   pt states he does not take med for cholesterol   OA (osteoarthritis) 04/05/2009   Psoriatic arthritis (HCC)    rheumonotologist--- dr aMeredith Stalls   Type 2 diabetes mellitus (HCC) 04/05/2009   followed by pcp---  (01-31-2021  per pt only checks blood sugar once weekly, last fasting sugar-- 122)    PAST SURGICAL HISTORY: Past Surgical History:  Procedure Laterality Date   COLONOSCOPY     last one 11-04-2020  by gessner   EXTRACORPOREAL SHOCK WAVE LITHOTRIPSY  2015   INCISION AND DRAINAGE ABSCESS N/A 02/03/2021   Procedure: EXCISION OF PERIANAL AND PERINEAL HYDRADENITIS, INTERROGATION OF PERIANAL WOUNDS;  Surgeon: [name], MD;  Location: [facility];  Service: General;  Laterality: N/A;   PARTIAL COLECTOMY N/A 02/26/2023   Procedure: EXPLORATORY LAPAROTOMY; PARTIAL COLECTOMY; COLOSTOMY;  Surgeon: [name], MD;  Location: [facility];  Service: General;  Laterality: N/A;   RECTAL EXAM UNDER ANESTHESIA  02/03/2021   Procedure: ANORECTAL EXAM UNDER ANESTHESIA;  Surgeon: [name], MD;  Location: [facility];  Service: General;;   TONSILLECTOMY     child   TRANSURETHRAL RESECTION OF BLADDER TUMOR  02/2008    FAMILY HISTORY: Family History  Problem Relation Age of Onset   Diabetes Mother    Hypertension Mother    Arthritis Father    Crohn's disease Daughter    Esophageal cancer Neg Hx    Colon cancer Neg Hx    Pancreatic cancer Neg Hx    Stomach cancer Neg Hx     SOCIAL HISTORY: Social History   Socioeconomic History   Marital status: Married    Spouse name: Not on file   Number of children: Not on file   Years of education: Not on file   Highest education level: Bachelor's degree (e.g., BA, AB, BS)  Occupational History   Not on file  Tobacco Use   Smoking status: Some Days    Types: Cigarettes, Cigars   Smokeless tobacco: Never   Tobacco  comments:    01-31-2021  per pt quit cigarettes 38 (age 89) for 8 yrs;  since then has smoked cigar's socially  Vaping Use   Vaping status: Never Used  Substance and Sexual Activity   Alcohol use: Not Currently    Comment: occasional   Drug use: No  Sexual activity: Not on file  Other Topics Concern   Not on file  Social History Narrative   Patient is married, a daughter has Crohn's disease   No alcohol or drug use he is an intermittent smoker of cigarettes and cigars   Social Drivers of Corporate investment banker Strain: Low Risk  (07/10/2023)   Overall Financial Resource Strain (CARDIA)    Difficulty of Paying Living Expenses: Not hard at all  Food Insecurity: No Food Insecurity (07/10/2023)   Hunger Vital Sign    Worried About Running Out of Food in the Last Year: Never true    Ran Out of Food in the Last Year: Never true  Transportation Needs: No Transportation Needs (07/10/2023)   PRAPARE - Administrator, Civil Service (Medical): No    Lack of Transportation (Non-Medical): No  Physical Activity: Insufficiently Active (07/10/2023)   Exercise Vital Sign    Days of Exercise per Week: 4 days    Minutes of Exercise per Session: 30 min  Stress: No Stress Concern Present (07/10/2023)   [employer] of Occupational Health - Occupational Stress Questionnaire    Feeling of Stress : Not at all  Social Connections: Socially Integrated (07/10/2023)   Social Connection and Isolation Panel [NHANES]    Frequency of Communication with Friends and Family: Twice a week    Frequency of Social Gatherings with Friends and Family: Once a week    Attends Religious Services: More than 4 times per year    Active Member of [employer] or Organizations: Yes    Attends Engineer, structural: More than 4 times per year    Marital Status: Married  Catering manager Violence: Not At Risk (02/20/2023)   Humiliation, Afraid, Rape, and Kick questionnaire    Fear of Current or Ex-Partner: No     Emotionally Abused: No    Physically Abused: No    Sexually Abused: No      I personally spent a total of 30 minutes in the care of the patient today including preparing to see the patient, performing a medically appropriate exam/evaluation, counseling and educating, and documenting clinical information in the EHR.  Johny Nap, AGNP-BC  [facility] [address] [place], [place] [address]  Phone [phone] Fax [phone] Note: This document was prepared with digital dictation and possible smart phrase technology. Any transcriptional errors that result from this process are unintentional.

## 2023-11-06 NOTE — Patient Instructions (Addendum)
 Okay to proceed with surgical procedure as you are 6 months post stroke. Can hold aspirin for duration determined by your surgeon and recommend restarting immediately after (or as advised by surgeon) with small but acceptable risk of recurrent stroke of therapy   Loop recorder will continue to be monitored by cardiology - you will be called with any abnormal findings  Continue aspirin 81 mg daily  and Crestor  10mg  daily  for secondary stroke prevention  Continue to follow up with PCP regarding cholesterol and diabetes management  Maintain strict control of diabetes with hemoglobin A1c goal below 7.0 % and cholesterol with LDL cholesterol (bad cholesterol) goal below 70 mg/dL.   Signs of a Stroke? Follow the BEFAST method:  Balance Watch for a sudden loss of balance, trouble with coordination or vertigo Eyes Is there a sudden loss of vision in one or both eyes? Or double vision?  Face: Ask the person to smile. Does one side of the face droop or is it numb?  Arms: Ask the person to raise both arms. Does one arm drift downward? Is there weakness or numbness of a leg? Speech: Ask the person to repeat a simple phrase. Does the speech sound slurred/strange? Is the person confused ? Time: If you observe any of these signs, call 911.       Thank you for coming to see us  at [facility]. I hope we have been able to provide you high quality care today.  You may receive a patient satisfaction survey over the next few weeks. We would appreciate your feedback and comments so that we may continue to improve ourselves and the health of our patients.

## 2023-11-06 NOTE — Telephone Encounter (Signed)
   Pre-operative Risk Assessment    Patient Name: Joshua [name]  DOB: [birth year] MRN: [id number]   Date of last office visit: 09/13/23 Harvie Liner, MD Date of next office visit: NONE   Request for Surgical Clearance    Procedure:  ROBOTIC COLOSTOMY TAKEDOWN, LYSISOF ADHESIONS, FELXIBLE SIGMOIDOSCOPY SURGERY  Date of Surgery:  Clearance TBD                                Surgeon:  Beatris Lincoln, MD Surgeon's Group or Practice Name:  [facility] Phone number:  [phone] Fax number:  [phone]  ATTN: Roberts Ching, CMA   Type of Clearance Requested:   - Medical  - Pharmacy:  Hold Aspirin     Type of Anesthesia:  General    Additional requests/questions:    Signed, Collin Deal   11/06/2023, 2:36 PM

## 2023-11-07 ENCOUNTER — Telehealth: Payer: Self-pay

## 2023-11-07 NOTE — Telephone Encounter (Signed)
   Name: Joshua [name]  DOB: [birth year]  MRN: [id number]  Primary Cardiologist: None   Preoperative team, please contact this patient and set up a phone call appointment for further preoperative risk assessment. Please obtain consent and complete medication review. Thank you for your help.  I confirm that guidance regarding antiplatelet and oral anticoagulation therapy has been completed and, if necessary, noted below.  Patient's ASA 81 mg is managed by neurology for history of cryptogenic CVA.  I also confirmed the patient resides in the state of [place] . As per [facility] Medical Board telemedicine laws, the patient must reside in the state in which the provider is licensed.   Francene Ing, Retha Cast, NP 11/07/2023, 8:03 AM [facility]

## 2023-11-07 NOTE — Telephone Encounter (Signed)
  Patient Consent for Virtual Visit         Joshua [name] has provided verbal consent on 11/07/2023 for a virtual visit (video or telephone).   CONSENT FOR VIRTUAL VISIT FOR:  Joshua [name]  By participating in this virtual visit I agree to the following:  I hereby voluntarily request, consent and authorize [facility] and its employed or contracted physicians, physician assistants, nurse practitioners or other licensed health care professionals (the Practitioner), to provide me with telemedicine health care services (the "Services") as deemed necessary by the treating Practitioner. I acknowledge and consent to receive the Services by the Practitioner via telemedicine. I understand that the telemedicine visit will involve communicating with the Practitioner through live audiovisual communication technology and the disclosure of certain medical information by electronic transmission. I acknowledge that I have been given the opportunity to request an in-person assessment or other available alternative prior to the telemedicine visit and am voluntarily participating in the telemedicine visit.  I understand that I have the right to withhold or withdraw my consent to the use of telemedicine in the course of my care at any time, without affecting my right to future care or treatment, and that the Practitioner or I may terminate the telemedicine visit at any time. I understand that I have the right to inspect all information obtained and/or recorded in the course of the telemedicine visit and may receive copies of available information for a reasonable fee.  I understand that some of the potential risks of receiving the Services via telemedicine include:  Delay or interruption in medical evaluation due to technological equipment failure or disruption; Information transmitted may not be sufficient (e.g. poor resolution of images) to allow for appropriate medical decision making by the  Practitioner; and/or  In rare instances, security protocols could fail, causing a breach of personal health information.  Furthermore, I acknowledge that it is my responsibility to provide information about my medical history, conditions and care that is complete and accurate to the best of my ability. I acknowledge that Practitioner's advice, recommendations, and/or decision may be based on factors not within their control, such as incomplete or inaccurate data provided by me or distortions of diagnostic images or specimens that may result from electronic transmissions. I understand that the practice of medicine is not an exact science and that Practitioner makes no warranties or guarantees regarding treatment outcomes. I acknowledge that a copy of this consent can be made available to me via my patient portal [facility] MyChart), or I can request a printed copy by calling the office of [facility].    I understand that my insurance will be billed for this visit.   I have read or had this consent read to me. I understand the contents of this consent, which adequately explains the benefits and risks of the Services being provided via telemedicine.  I have been provided ample opportunity to ask questions regarding this consent and the Services and have had my questions answered to my satisfaction. I give my informed consent for the services to be provided through the use of telemedicine in my medical care

## 2023-11-07 NOTE — Telephone Encounter (Signed)
 Patient agreeable with virtual telehealth appointment. Medication list and consent have both been reviewed. Patient voiced understanding.

## 2023-11-09 ENCOUNTER — Encounter: Payer: Self-pay | Admitting: Physician Assistant

## 2023-11-09 ENCOUNTER — Ambulatory Visit: Admitting: Physician Assistant

## 2023-11-09 VITALS — BP 116/64 | HR 65 | Ht 68.0 in | Wt 192.0 lb

## 2023-11-09 DIAGNOSIS — Z8673 Personal history of transient ischemic attack (TIA), and cerebral infarction without residual deficits: Secondary | ICD-10-CM

## 2023-11-09 DIAGNOSIS — Z933 Colostomy status: Secondary | ICD-10-CM | POA: Diagnosis not present

## 2023-11-09 DIAGNOSIS — E669 Obesity, unspecified: Secondary | ICD-10-CM

## 2023-11-09 DIAGNOSIS — Z860100 Personal history of colon polyps, unspecified: Secondary | ICD-10-CM | POA: Diagnosis not present

## 2023-11-09 DIAGNOSIS — Z860101 Personal history of adenomatous and serrated colon polyps: Secondary | ICD-10-CM

## 2023-11-09 NOTE — Patient Instructions (Addendum)
 _______________________________________________________  If your blood pressure at your visit was 140/90 or greater, please contact your primary care physician to follow up on this.  _______________________________________________________  If you are age 68 or older, your body mass index should be between 23-30. Your Body mass index is 29.19 kg/m. If this is out of the aforementioned range listed, please consider follow up with your Primary Care Provider.  If you are age 62 or younger, your body mass index should be between 19-25. Your Body mass index is 29.19 kg/m. If this is out of the aformentioned range listed, please consider follow up with your Primary Care Provider.   ________________________________________________________  The [facility] providers would like to encourage you to use MYCHART to communicate with providers for non-urgent requests or questions.  Due to long hold times on the telephone, sending your provider a message by [facility] may be a faster and more efficient way to get a response.  Please allow 48 business hours for a response.  Please remember that this is for non-urgent requests.  _______________________________________________________  Please call us  in 2-3 weeks to talk to the nurse to see about your colonoscopy if you haven't heard from us  regarding anything.  It was a pleasure to see you today!  Thank you for trusting me with your gastrointestinal care!

## 2023-11-09 NOTE — Progress Notes (Signed)
 Chief Complaint: Discuss colonoscopy  HPI:    Joshua [name] is a  68 y/o male with a past medical history as listed below including stroke in December 2024, osteoarthritis, type 2 diabetes and partial colectomy on aspirin and Ozempic , known to Joshua [name], who presents to clinic today to discuss a colonoscopy.    11/04/2020 colonoscopy done for surveillance of a large sessile adenoma removed piecemeal less than 3 years prior.  Findings of one 3 mm polyp in the transverse colon, one 1 mm polyp in the cecum and post polypectomy scar in the cecum as well as a stricture in the distal sigmoid colon (pediatric colonoscope required to traverse), diverticulosis in the sigmoid colon and hemorrhoids.  At that time recommended MRI of the pelvis with and without contrast due to perianal fistula by history and sigmoid colon stricture.  Pathology showed diminutive sessile serrated polyp and repeat recommended in 3 years.    11/16/2020 MRI of the pelvis with and without contrast showed left-sided perianal fistula which appeared to originate at the 1 o'clock position and coursed down the left perineum.  No abscess.  At that time referred to colorectal surgery.    11/06/2023 patient seen by general surgery, Joshua [name] for fistula.  At that time exam with left anterior perianal wound with 2 separate openings that look more like Swiss cheese in appearance and more likely hidradenitis.  Discussed he was on Enbrel for psoriatic arthritis.  And had known history of hidradenitis.  Patient has a parastomal hernia and positive colostomy status.  Plans to update colonoscopy prior to surgery.    Today, patient presents to clinic accompanied by his wife.  He tells me he has had this colostomy bag for 7 months and is ready to get it off.  Unfortunately he had a cryptogenic stroke in December, has done well since then.  They think it was a sequela of the surgery itself.  He is on Aspirin as well as Ozempic .  Tells me he went to see the  surgical team he wants him to have a colonoscopy prior to removal.  Otherwise no GI complaints or concerns.  He remains on MiraLAX  daily to keep things moving and make the bag easier to clean.    Patient is going to the beach at the end of July and would like for all of this to be coordinated before then if possible.    Denies fever, chills or weight loss.  Past Medical History:  Diagnosis Date   Anal fistula    Hidradenitis    History of bladder cancer 2009   urologist-- Joshua [name]---  s/p TURBT 2010,  recurrence in office 2014 ,  none since per pt   History of colonic polyps    History of COVID-19 04/2019   per pt had covid pneumonia recovered at home, no oxygen, symptoms resolved   History of diverticulitis of colon    History of hypertension 04/05/2009   per the pt he lost 55 lbs and htn has been reversed   History of kidney stones    Hyperlipidemia 04/05/2009   pt states he does not take med for cholesterol   OA (osteoarthritis) 04/05/2009   Psoriatic arthritis (HCC)    rheumonotologist--- Joshua [name]   Type 2 diabetes mellitus (HCC) 04/05/2009   followed by pcp---  (01-31-2021  per pt only checks blood sugar once weekly, last fasting sugar-- 122)    Past Surgical History:  Procedure Laterality Date   COLONOSCOPY  last one 11-04-2020  by Joshua [name]   EXTRACORPOREAL SHOCK WAVE LITHOTRIPSY  2015   INCISION AND DRAINAGE ABSCESS N/A 02/03/2021   Procedure: EXCISION OF PERIANAL AND PERINEAL HYDRADENITIS, INTERROGATION OF PERIANAL WOUNDS;  Surgeon: Joshua Stabs, MD;  Location: [facility];  Service: General;  Laterality: N/A;   PARTIAL COLECTOMY N/A 02/26/2023   Procedure: EXPLORATORY LAPAROTOMY; PARTIAL COLECTOMY; COLOSTOMY;  Surgeon: Joshua Blumenthal, MD;  Location: [facility];  Service: General;  Laterality: N/A;   RECTAL EXAM UNDER ANESTHESIA  02/03/2021   Procedure: ANORECTAL EXAM UNDER ANESTHESIA;  Surgeon: Joshua Stabs, MD;  Location: [facility];  Service: General;;   TONSILLECTOMY     child   TRANSURETHRAL RESECTION OF BLADDER TUMOR  02/2008    Current Outpatient Medications  Medication Sig Dispense Refill   acetaminophen  (TYLENOL ) 500 MG tablet Take 2 tablets (1,000 mg total) by mouth every 6 (six) hours as needed. 30 tablet 0   aspirin 81 MG chewable tablet Chew 81 mg by mouth daily.     BAYER MICROLET LANCETS lancets Check Blood Sugar Once Daily. 30 each 5   betamethasone  dipropionate 0.05 % lotion Apply topically daily.     clindamycin (CLEOCIN T) 1 % lotion Apply 1 application  topically daily.     empagliflozin  (JARDIANCE ) 10 MG TABS tablet Take 1 tablet (10 mg total) by mouth daily. 90 tablet 1   inFLIXimab -axxq (AVSOLA ) 100 MG injection 100 mg by Intravenous (Continuous Infusion) route See admin instructions. GENERIC Infusion every 8 weeks     ONETOUCH ULTRA test strip USE AS DIRECTED TO CHECK BLOOD GLUCOSE ONE TIME DAILY 100 strip 10   polyethylene glycol (MIRALAX  / GLYCOLAX ) 17 g packet Take 17 g by mouth daily as needed for mild constipation. 14 each 0   rosuvastatin  (CRESTOR ) 10 MG tablet Take 1 tablet (10 mg total) by mouth daily. 90 tablet 0   Semaglutide ,0.25 or 0.5MG /DOS, (OZEMPIC , 0.25 OR 0.5 MG/DOSE,) 2 MG/3ML SOPN Inject 0.25 mg into the skin once a week. 3 mL 2   triamcinolone  cream (KENALOG) 0.1 % Apply 1 Application topically 2 (two) times daily as needed.     No current facility-administered medications for this visit.    Allergies as of 11/09/2023 - Review Complete 11/06/2023  Allergen Reaction Noted   Adalimumab Other (See Comments) 09/08/2020   Metformin  and related Other (See Comments) 10/12/2023   Penicillin g Other (See Comments) 09/08/2020   Penicillins Hives 04/05/2009    Family History  Problem Relation Age of Onset   Diabetes Mother    Hypertension Mother    Arthritis Father    Crohn's disease Daughter    Esophageal cancer Neg Hx    Colon cancer Neg Hx    Pancreatic  cancer Neg Hx    Stomach cancer Neg Hx     Social History   Socioeconomic History   Marital status: Married    Spouse name: Not on file   Number of children: Not on file   Years of education: Not on file   Highest education level: Bachelor's degree (e.g., BA, AB, BS)  Occupational History   Not on file  Tobacco Use   Smoking status: Some Days    Types: Cigarettes, Cigars   Smokeless tobacco: Never   Tobacco comments:    01-31-2021  per pt quit cigarettes 1981 (age 21) for 8 yrs;  since then has smoked cigar's socially  Vaping Use   Vaping status: Never Used  Substance  and Sexual Activity   Alcohol use: Not Currently    Comment: occasional   Drug use: No   Sexual activity: Not on file  Other Topics Concern   Not on file  Social History Narrative   Patient is married, a daughter has Crohn's disease   No alcohol or drug use he is an intermittent smoker of cigarettes and cigars   Social Drivers of Corporate investment banker Strain: Low Risk  (07/10/2023)   Overall Financial Resource Strain (CARDIA)    Difficulty of Paying Living Expenses: Not hard at all  Food Insecurity: No Food Insecurity (07/10/2023)   Hunger Vital Sign    Worried About Running Out of Food in the Last Year: Never true    Ran Out of Food in the Last Year: Never true  Transportation Needs: No Transportation Needs (07/10/2023)   PRAPARE - Administrator, Civil Service (Medical): No    Lack of Transportation (Non-Medical): No  Physical Activity: Insufficiently Active (07/10/2023)   Exercise Vital Sign    Days of Exercise per Week: 4 days    Minutes of Exercise per Session: 30 min  Stress: No Stress Concern Present (07/10/2023)   [employer] of Occupational Health - Occupational Stress Questionnaire    Feeling of Stress : Not at all  Social Connections: Socially Integrated (07/10/2023)   Social Connection and Isolation Panel [NHANES]    Frequency of Communication with Friends and Family: Twice a  week    Frequency of Social Gatherings with Friends and Family: Once a week    Attends Religious Services: More than 4 times per year    Active Member of [employer] or Organizations: Yes    Attends Engineer, structural: More than 4 times per year    Marital Status: Married  Catering manager Violence: Not At Risk (02/20/2023)   Humiliation, Afraid, Rape, and Kick questionnaire    Fear of Current or Ex-Partner: No    Emotionally Abused: No    Physically Abused: No    Sexually Abused: No    Review of Systems:    Constitutional: No weight loss, fever or chills Skin: No rash  Cardiovascular: No chest pain Respiratory: No SOB  Gastrointestinal: See HPI and otherwise negative Genitourinary: No dysuria Neurological: No headache, dizziness or syncope Musculoskeletal: No new muscle or joint pain Hematologic: No bleeding Psychiatric: No history of depression or anxiety   Physical Exam:  Vital signs: BP 116/64   Pulse 65   Ht 5\' 8"  (1.727 m)   Wt 192 lb (87.1 kg)   BMI 29.19 kg/m    Constitutional:   Pleasant male appears to be in NAD, Well developed, Well nourished, alert and cooperative Head:  Normocephalic and atraumatic. Eyes:   PEERL, EOMI. No icterus. Conjunctiva pink. Ears:  Normal auditory acuity. Neck:  Supple Throat: Oral cavity and pharynx without inflammation, swelling or lesion.  Respiratory: Respirations even and unlabored. Lungs clear to auscultation bilaterally.   No wheezes, crackles, or rhonchi.  Cardiovascular: Normal S1, S2. No MRG. Regular rate and rhythm. No peripheral edema, cyanosis or pallor.  Gastrointestinal:  Soft, nondistended, nontender. No rebound or guarding. Normal bowel sounds. No appreciable masses or hepatomegaly.+colostomy Rectal:  Not performed.  Msk:  Symmetrical without gross deformities. Without edema, no deformity or joint abnormality.  Neurologic:  Alert and  oriented x4;  grossly normal neurologically.  Skin:   Dry and intact without  significant lesions or rashes. Psychiatric:  Demonstrates good judgement and reason  without abnormal affect or behaviors.  RELEVANT LABS AND IMAGING: CBC    Component Value Date/Time   WBC 9.3 04/10/2023 1012   RBC 4.58 04/10/2023 1012   HGB 14.5 04/10/2023 1012   HCT 44.3 04/10/2023 1012   PLT 307.0 04/10/2023 1012   MCV 96.8 04/10/2023 1012   MCH 31.5 03/01/2023 0423   MCHC 32.6 04/10/2023 1012   RDW 14.7 04/10/2023 1012   LYMPHSABS 1.6 04/10/2023 1012   MONOABS 0.6 04/10/2023 1012   EOSABS 0.3 04/10/2023 1012   BASOSABS 0.1 04/10/2023 1012    CMP     Component Value Date/Time   NA 138 04/10/2023 1012   K 4.3 04/10/2023 1012   CL 102 04/10/2023 1012   CO2 27 04/10/2023 1012   GLUCOSE 134 (H) 04/10/2023 1012   BUN 18 04/10/2023 1012   CREATININE 0.84 04/10/2023 1012   CALCIUM  10.0 04/10/2023 1012   PROT 8.7 (H) 10/12/2023 0918   ALBUMIN 4.5 10/12/2023 0918   AST 22 10/12/2023 0918   ALT 32 10/12/2023 0918   ALKPHOS 107 10/12/2023 0918   BILITOT 0.6 10/12/2023 0918   GFRNONAA >60 03/01/2023 0423   GFRAA >60 05/01/2019 2310    Assessment: 1.  Status post ex lap/Hartman's 02/26/2023 for chronic diverticulitis: With colostomy, plans per surgery for reversal as soon as he has colonoscopy 2.  History of neurogenic stroke: In December, patient has cardiac clearance pending, telephone visit with them 11/14/2023, currently wearing a loop recorder, but they think stroke was caused as a sequelae from surgery and nothing else 3.  History of colon polyps: Last colonoscopy in 2022 with repeat recommended in 3 years 4.  Obesity: On Ozempic   Plan: 1.  Discussed with patient that we will try to coordinate Joshua [name] and Joshua [name] schedule so that he can bowel prep for the colonoscopy and then go to have his colostomy reversal the next day so he does not have to do another bowel prep.  Patient would like all this done for the last week in July if possible.  Explained that this may or  may not happen. 2.  Patient will need to hold his Ozempic  for 7 days prior to time of procedure. 3.  We do not require the patient to hold his Aspirin, but I am not sure if the surgical team feels the same way. 4.  Patient will be contacted when we can find a date that works for all of us .  Reginal Capra, PA-C [facility] 11/09/2023, 9:17 AM  Cc: Marquetta Sit, MD

## 2023-11-12 ENCOUNTER — Telehealth: Payer: Self-pay

## 2023-11-12 NOTE — Telephone Encounter (Signed)
-----   Message from Graciella Lavender sent at 11/12/2023  8:34 AM EDT ----- Regarding: FW: Coordinate Colonoscopy and Colostomy reversal Please let patient know we are unable to coordinate his colonoscopy and reversal.  He has upcoming appointment with cardiology for cardiac clearance.  Once this is done he should call back and let us  know so we can hopefully get him set up for his colonoscopy.  Thanks-JLL ----- Message ----- From: Kenney Peacemaker, MD Sent: 11/10/2023  10:49 AM EDT To: [name], MD; # Subject: RE: Coordinate Colonoscopy and Colostomy rev#  [facility] - please f/u on cardiac clearance and then let's set him up for a colonoscopy - and explain that we cannot set it up to be done day before surgery due to the possibility of abnormal findings that would need to be sorted out prior to operating  CEG ----- Message ----- From: [name], MD Sent: 11/09/2023  11:01 AM EDT To: Kenney Peacemaker, MD; Graciella Lavender, [place] Subject: RE: Coordinate Colonoscopy and Colostomy rev#  Yes, we will need cardiac clearance to schedule.   Coordinating colonoscopy and surgery can be very tricky particularly if he has a surgery scheduled earlier in the week. I'm personally booking out about a month - month and a half at the moment. Logistically, it may be best to have a scope while preparations are underway. Additionally, if things are found that we needed path on, it's not delaying his surgery to get completed.  CW ----- Message ----- From: Graciella Lavender, PA Sent: 11/09/2023   9:42 AM EDT To: [name], MD; Kenney Peacemaker, MD Subject: Coordinate Colonoscopy and Colostomy reversal  Hello team,     I saw this patient in clinic today, he needs a colonoscopy prior to colostomy reversal.  Hoping that you guys can coordinate so this gentleman does not have to do extensive bowel preps.  He is itching to get this done and would like it done before the last week in  July when he had to the beach, not sure how feasible that is, but will leave to you to to figure out.  He does have a telemedicine visit scheduled with cardiology for cardiac clearance given that he is currently wearing a loop recorder.  This is scheduled for 11/14/2023.  Thanks, Joshua Capra, PA-C

## 2023-11-12 NOTE — Telephone Encounter (Signed)
 The pt has been advised He will follow up with cardiology and reach out to us  after workup.

## 2023-11-14 ENCOUNTER — Encounter: Payer: Self-pay | Admitting: Nurse Practitioner

## 2023-11-14 ENCOUNTER — Ambulatory Visit: Attending: Cardiovascular Disease | Admitting: Nurse Practitioner

## 2023-11-14 DIAGNOSIS — Z01810 Encounter for preprocedural cardiovascular examination: Secondary | ICD-10-CM

## 2023-11-14 NOTE — Progress Notes (Signed)
 Virtual Visit via Telephone Note   Because of Joshua [name] co-morbid illnesses, he is at least at moderate risk for complications without adequate follow up.  This format is felt to be most appropriate for this patient at this time.  Due to technical limitations with video connection (technology), today's appointment will be conducted as an audio only telehealth visit, and Joshua [name] verbally agreed to proceed in this manner.   All issues noted in this document were discussed and addressed.  No physical exam could be performed with this format.  Evaluation Performed:  Preoperative cardiovascular risk assessment _____________   Date:  11/14/2023   Patient ID:  Joshua [name], DOB [birth year], MRN [id number] Patient Location:  Home Provider location:   Office  Primary Care Provider:  Marquetta Sit, MD Primary Cardiologist:  None  Chief Complaint / Patient Profile   68 y.o. 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 [name] on date TBD and presents today for telephonic preoperative cardiovascular risk assessment.  History of Present Illness    Joshua [name] is a 68 y.o. male who presents via audio/video conferencing for a telehealth visit today.  Pt was last seen in cardiology clinic on 09/13/23 by Dr. Marven Slimmer.  At that time Joshua [name] was doing well.  The patient is now pending procedure as outlined above. Since his last visit, he denies chest pain, shortness of breath, lower extremity edema, fatigue, palpitations, melena, hematuria, hemoptysis, diaphoresis, weakness, presyncope, syncope, orthopnea, and PND.  He remains active with regular elliptical and weightlifting exercise and does not have any concerning cardiac symptoms.   Past Medical History    Past Medical History:  Diagnosis Date    Anal fistula    Hidradenitis    History of bladder cancer 2009   urologist-- dr [name]---  s/p TURBT 2010,  recurrence in office 2014 ,  none since per pt   History of colonic polyps    History of COVID-19 04/2019   per pt had covid pneumonia recovered at home, no oxygen, symptoms resolved   History of diverticulitis of colon    History of hypertension 04/05/2009   per the pt he lost 55 lbs and htn has been reversed   History of kidney stones    Hyperlipidemia 04/05/2009   pt states he does not take med for cholesterol   OA (osteoarthritis) 04/05/2009   Psoriatic arthritis (HCC)    rheumonotologist--- dr a. Meredith Stalls   Type 2 diabetes mellitus (HCC) 04/05/2009   followed by pcp---  (01-31-2021  per pt only checks blood sugar once weekly, last fasting sugar-- 122)   Past Surgical History:  Procedure Laterality Date   COLONOSCOPY     last one 11-04-2020  by gessner   EXTRACORPOREAL SHOCK WAVE LITHOTRIPSY  2015   INCISION AND DRAINAGE ABSCESS N/A 02/03/2021   Procedure: EXCISION OF PERIANAL AND PERINEAL HYDRADENITIS, INTERROGATION OF PERIANAL WOUNDS;  Surgeon: [name], MD;  Location: [facility];  Service: General;  Laterality: N/A;   PARTIAL COLECTOMY N/A 02/26/2023   Procedure: EXPLORATORY LAPAROTOMY; PARTIAL COLECTOMY; COLOSTOMY;  Surgeon: [name], MD;  Location: [facility];  Service: General;  Laterality: N/A;   RECTAL EXAM UNDER ANESTHESIA  02/03/2021   Procedure: ANORECTAL EXAM UNDER ANESTHESIA;  Surgeon: [name], MD;  Location: [facility];  Service: General;;  TONSILLECTOMY     child   TRANSURETHRAL RESECTION OF BLADDER TUMOR  02/2008    Allergies  Allergies  Allergen Reactions   Adalimumab Other (See Comments)   Metformin  And Related Other (See Comments)   Penicillin G Other (See Comments)   Penicillins Hives    And fever blisters    Home Medications    Prior to Admission medications   Medication Sig Start  Date End Date Taking? Authorizing Provider  acetaminophen  (TYLENOL ) 500 MG tablet Take 2 tablets (1,000 mg total) by mouth every 6 (six) hours as needed. 03/02/23   [name], PA-C  aspirin 81 MG chewable tablet Chew 81 mg by mouth daily.    [provider]  BAYER MICROLET LANCETS lancets Check Blood Sugar Once Daily. 02/21/16   Burchette, Marijean Shouts, MD  betamethasone  dipropionate 0.05 % lotion Apply topically daily. 03/26/23   [provider]  clindamycin (CLEOCIN T) 1 % lotion Apply 1 application  topically daily. 03/07/21   [provider]  empagliflozin  (JARDIANCE ) 10 MG TABS tablet Take 1 tablet (10 mg total) by mouth daily. 11/06/23   Burchette, Marijean Shouts, MD  inFLIXimab -axxq (AVSOLA ) 100 MG injection 100 mg by Intravenous (Continuous Infusion) route See admin instructions. GENERIC Infusion every 8 weeks 06/28/21   [provider]  [facility] ULTRA test strip USE AS DIRECTED TO CHECK BLOOD GLUCOSE ONE TIME DAILY 08/29/19   Burchette, Marijean Shouts, MD  polyethylene glycol (MIRALAX  / GLYCOLAX ) 17 g packet Take 17 g by mouth daily as needed for mild constipation. 03/02/23   [name], PA-C  rosuvastatin  (CRESTOR ) 10 MG tablet Take 1 tablet (10 mg total) by mouth daily. 10/12/23   Burchette, Marijean Shouts, MD  Semaglutide ,0.25 or 0.5MG /DOS, (OZEMPIC , 0.25 OR 0.5 MG/DOSE,) 2 MG/3ML SOPN Inject 0.25 mg into the skin once a week. 10/12/23   Burchette, Marijean Shouts, MD  triamcinolone  cream (KENALOG) 0.1 % Apply 1 Application topically 2 (two) times daily as needed. 12/29/22   [provider]    Physical Exam    Vital Signs:  [name] does not have vital signs available for review today.  Given telephonic nature of communication, physical exam is limited. AAOx3. NAD. Normal affect.  Speech and respirations are unlabored.  Accessory Clinical Findings    None  Assessment & Plan    1.  Preoperative Cardiovascular Risk Assessment: According to the Revised  Cardiac Risk Index (RCRI), his Perioperative Risk of Major Cardiac Event is (%): 6.6. His Functional Capacity in METs is: 8.33 according to the Duke Activity Status Index (DASI). The patient is doing well from a cardiac perspective. Therefore, based on ACC/AHA guidelines, the patient would be at acceptable risk for the planned procedure without further cardiovascular testing.   The patient was advised that if he develops new symptoms prior to surgery to contact our office to arrange for a follow-up visit, and he verbalized understanding.  Patient's ASA 81 mg is managed by neurology for history of cryptogenic CVA.   A copy of this note will be routed to requesting surgeon.  Time:   Today, I have spent 10 minutes with the patient with telehealth technology discussing medical history, symptoms, and management plan.     Gerldine Koch, NP-C  11/14/2023, 10:24 AM [address], [address] [place], [place] [address] Office [phone] Fax [phone]

## 2023-11-19 ENCOUNTER — Ambulatory Visit

## 2023-11-19 ENCOUNTER — Telehealth: Payer: Self-pay | Admitting: Neurology

## 2023-11-19 DIAGNOSIS — L4059 Other psoriatic arthropathy: Secondary | ICD-10-CM | POA: Diagnosis not present

## 2023-11-19 DIAGNOSIS — M1991 Primary osteoarthritis, unspecified site: Secondary | ICD-10-CM | POA: Diagnosis not present

## 2023-11-19 DIAGNOSIS — L732 Hidradenitis suppurativa: Secondary | ICD-10-CM | POA: Diagnosis not present

## 2023-11-19 DIAGNOSIS — L409 Psoriasis, unspecified: Secondary | ICD-10-CM | POA: Diagnosis not present

## 2023-11-19 DIAGNOSIS — I639 Cerebral infarction, unspecified: Secondary | ICD-10-CM

## 2023-11-19 LAB — CUP PACEART REMOTE DEVICE CHECK
Date Time Interrogation Session: 20250615191053
Implantable Pulse Generator Implant Date: 20250410

## 2023-11-19 NOTE — Telephone Encounter (Signed)
 Received presurgical neurological clearance request from [facility] surgery by Dr. Beatris Lincoln  Patient is scheduled for robotic colostomy takedown, lysis of adhesion, flexible sigmoidoscopy surgery under general anesthesia  See note from June 3rd 2025 for clearance.  Fax note to [phone] [facility]

## 2023-11-20 ENCOUNTER — Ambulatory Visit: Payer: Self-pay | Admitting: Cardiology

## 2023-11-20 NOTE — Telephone Encounter (Signed)
 FAXED TO: [phone] Roberts Ching   FOR SURGICAL CLEARANCE

## 2023-11-22 ENCOUNTER — Encounter: Payer: Self-pay | Admitting: Internal Medicine

## 2023-11-22 ENCOUNTER — Encounter

## 2023-11-22 NOTE — Telephone Encounter (Signed)
 Dr Willy Harvest can the pt be scheduled for colon? If so, do you need to coordinate with Dr [name]?

## 2023-11-23 ENCOUNTER — Telehealth: Payer: Self-pay

## 2023-11-23 NOTE — Telephone Encounter (Signed)
 Me    11/23/23 12:15 PM Note Ruthann Cover see below and message from Dr Willy Harvest. Are you working on this? I dont want to jump in if you are already doing it.             1.  Discussed with patient that we will try to coordinate Dr. Willy Harvest and Dr. [name] schedule so that he can bowel prep for the colonoscopy and then go to have his colostomy reversal the next day so he does not have to do another bowel prep.  Patient would like all this done for the last week in July if possible.  Explained that this may or may not happen. 2.  Patient will need to hold his Ozempic  for 7 days prior to time of procedure. 3.  We do not require the patient to hold his Aspirin, but I am not sure if the surgical team feels the same way. 4.  Patient will be contacted when we can find a date that works for all of us .   Reginal Capra, PA-C [facility] 11/09/2023, 9:17 AM    Kenney Peacemaker, MD to Me     11/22/23  3:27 PM He cannot have it right before the colon surgery - There was a staff message back and forth about it and I thought that the APP was going to arrange.   Dr. [name] wants some time between the colonoscopy and surgery in case we find something that would change plans - unlikely but makes sense.   So ok to set him up for a colonoscopy  - you can also refer to the note from [facility]   [facility]   11/22/23  1:57 PM You routed this conversation to Kenney Peacemaker, MD  Me    11/22/23  1:57 PM Note Dr Willy Harvest can the pt be scheduled for colon? If so, do you need to coordinate with Dr [name]?     Jossue J Marcy to [facility] (supporting Kenney Peacemaker, MD)     11/22/23  1:57 PM Thanks  Messages that only contain a simple Thank you are hidden in In Basket to save you clicks. Me to Rozetta Corns and proxy YANI LAL)     11/22/23  1:56 PM I will send to Dr Willy Harvest to review and get back to you as soon as I can.   Last read by Ronit J Coger at 1:56PM on  11/22/2023. Rozetta Corns to [facility] (supporting Kenney Peacemaker, MD)     11/22/23  1:49 PM Hi Dr. Willy Harvest, I wanted to follow up with you and your office to hopefully get scheduled to have my colonoscopy so I can proceed with my ostomy removal. I believe you have all my information on approvals from all my doctors. The last I heard was your team was going to work with Dr Maurie Southern team to schedule together. If this is the hold up, I am willing to prep twice. I am anxious to get back to living normal.  Thanks.

## 2023-11-23 NOTE — Telephone Encounter (Signed)
 Joshua [name] see below and message from Dr Willy Harvest. Are you working on this? I dont want to jump in if you are already doing it.        1.  Discussed with patient that we will try to coordinate Dr. Willy Harvest and Dr. [name] schedule so that he can bowel prep for the colonoscopy and then go to have his colostomy reversal the next day so he does not have to do another bowel prep.  Patient would like all this done for the last week in July if possible.  Explained that this may or may not happen. 2.  Patient will need to hold his Ozempic  for 7 days prior to time of procedure. 3.  We do not require the patient to hold his Aspirin, but I am not sure if the surgical team feels the same way. 4.  Patient will be contacted when we can find a date that works for all of us .   Joshua Capra, PA-C  Gastroenterology 11/09/2023, 9:17 AM

## 2023-11-26 NOTE — Addendum Note (Signed)
 Addended by: VICCI SELLER A on: 11/26/2023 01:21 PM   Modules accepted: Orders

## 2023-11-26 NOTE — Progress Notes (Signed)
 [facility] Summary Report / Loop Recorder

## 2023-11-27 NOTE — Telephone Encounter (Signed)
 The pt has been advised via My Chart- pt preference that he can call and make colon appt with Dr Avram in the [facility].  I see no blood thinner on the med list but pt is on GLP1. Previsit appt to be made for pt to instruct.

## 2023-11-27 NOTE — Telephone Encounter (Signed)
 Delon can this colon be in the [facility]? Or hospital with Dr Avram ?

## 2023-11-27 NOTE — Telephone Encounter (Signed)
 I see ozempic  in the med list but not a blood thinner but I will check on it.

## 2023-11-28 ENCOUNTER — Encounter: Payer: Self-pay | Admitting: Internal Medicine

## 2023-11-29 ENCOUNTER — Encounter: Admitting: Internal Medicine

## 2023-11-29 ENCOUNTER — Other Ambulatory Visit: Payer: Self-pay | Admitting: Urology

## 2023-12-03 DIAGNOSIS — K5732 Diverticulitis of large intestine without perforation or abscess without bleeding: Secondary | ICD-10-CM | POA: Diagnosis not present

## 2023-12-03 DIAGNOSIS — Z933 Colostomy status: Secondary | ICD-10-CM | POA: Diagnosis not present

## 2023-12-04 DIAGNOSIS — E119 Type 2 diabetes mellitus without complications: Secondary | ICD-10-CM | POA: Diagnosis not present

## 2023-12-05 ENCOUNTER — Ambulatory Visit: Payer: Medicare Other | Admitting: Adult Health

## 2023-12-20 ENCOUNTER — Ambulatory Visit (INDEPENDENT_AMBULATORY_CARE_PROVIDER_SITE_OTHER)

## 2023-12-20 DIAGNOSIS — I639 Cerebral infarction, unspecified: Secondary | ICD-10-CM

## 2023-12-20 LAB — CUP PACEART REMOTE DEVICE CHECK
Date Time Interrogation Session: 20250717003527
Implantable Pulse Generator Implant Date: 20250410

## 2023-12-22 ENCOUNTER — Ambulatory Visit: Payer: Self-pay | Admitting: Cardiology

## 2023-12-27 ENCOUNTER — Encounter

## 2023-12-27 NOTE — Progress Notes (Signed)
 [facility] Summary Report / Loop Recorder

## 2023-12-28 ENCOUNTER — Ambulatory Visit: Admitting: *Deleted

## 2023-12-28 ENCOUNTER — Encounter: Payer: Self-pay | Admitting: Internal Medicine

## 2023-12-28 VITALS — Ht 68.0 in | Wt 190.0 lb

## 2023-12-28 DIAGNOSIS — Z933 Colostomy status: Secondary | ICD-10-CM

## 2023-12-28 DIAGNOSIS — K5732 Diverticulitis of large intestine without perforation or abscess without bleeding: Secondary | ICD-10-CM

## 2023-12-28 NOTE — Progress Notes (Signed)
 Pt's name and DOB verified at the beginning of the pre-visit wit 2 identifiers  Permission given to speak with wife by pt  Pt denies any difficulty with ambulating,sitting, laying down or rolling side to side  Pt has no issues moving head neck or swallowing  No egg or soy allergy known to patient   No issues known to pt with past sedation with any surgeries or procedures  No FH of Malignant Hyperthermia  Pt is not on home 02   Pt is not on blood thinners   Pt denies issues with constipation   Pt is not on dialysis  Pt denise any abnormal heart rhythms   Pt wearing loop recorder  Patient's chart reviewed by Norleen Schillings CNRA prior to pre-visit and patient appropriate for the [facility].  Pre-visit completed and red dot placed by patient's name on their procedure day (on provider's schedule).    Visit by phon  Pt states weight is 190 lb  IInstructions reviewed. Pt given  both [facility] main # and MD on call # prior to instructions.  Pt states understanding of instructions. Instructed pt to review instructions again prior to procedure and call main # given if has questions.. Pt states they will.   Instructed pt on where to find instructions on My Chart.

## 2024-01-03 NOTE — Progress Notes (Signed)
 Surgery orders requested via Epic inbox.

## 2024-01-04 ENCOUNTER — Ambulatory Visit: Payer: Self-pay | Admitting: Surgery

## 2024-01-04 DIAGNOSIS — E119 Type 2 diabetes mellitus without complications: Secondary | ICD-10-CM | POA: Diagnosis not present

## 2024-01-04 DIAGNOSIS — Z01818 Encounter for other preprocedural examination: Secondary | ICD-10-CM

## 2024-01-08 NOTE — Progress Notes (Signed)
 COVID Vaccine Completed:  Date of COVID positive in last 90 days:  PCP - Wolm Scarlet, MD Cardiologist - Ole Holts, MD  Chest x-ray -  EKG - 07/11/23 Epic Stress Test -  ECHO - 06/29/23 Epic Cardiac Cath -  Pacemaker/ICD device last checked: loop recorder 12/20/23 Epic Spinal Cord Stimulator:  Bowel Prep -   Sleep Study -  CPAP -   Fasting Blood Sugar -  Checks Blood Sugar _____ times a day  Last dose of GLP1 agonist-  N/A GLP1 instructions:  Do not take after     Last dose of SGLT-2 inhibitors-  N/A SGLT-2 instructions:  Do not take after     Blood Thinner Instructions:  Last dose:   Time: Aspirin Instructions: ASA 81 Last Dose:  Activity level:  Can go up a flight of stairs and perform activities of daily living without stopping and without symptoms of chest pain or shortness of breath.  Able to exercise without symptoms  Unable to go up a flight of stairs without symptoms of     Anesthesia review: HTN, CVA, DM2  Patient denies shortness of breath, fever, cough and chest pain at PAT appointment  Patient verbalized understanding of instructions that were given to them at the PAT appointment. Patient was also instructed that they will need to review over the PAT instructions again at home before surgery.

## 2024-01-09 NOTE — Patient Instructions (Signed)
 SURGICAL WAITING ROOM VISITATION  Patients having surgery or a procedure may have no more than 2 support people in the waiting area - these visitors may rotate.    Children under the age of 4 must have an adult with them who is not the patient.  Visitors with respiratory illnesses are discouraged from visiting and should remain at home.  If the patient needs to stay at the hospital during part of their recovery, the visitor guidelines for inpatient rooms apply. Pre-op nurse will coordinate an appropriate time for 1 support person to accompany patient in pre-op.  This support person may not rotate.    Please refer to the [facility] website for the visitor guidelines for Inpatients (after your surgery is over and you are in a regular room).    Your procedure is scheduled on: 01/17/24   Report to [facility] Main Entrance    Report to admitting at 10:30 AM   Call this number if you have problems the morning of surgery [phone]   Follow a clear liquid diet the day before surgery.   You may have the following liquids until 9:45 AM DAY OF SURGERY  Water Non-Citrus Juices (without pulp, NO RED-Apple, White grape, White cranberry) Black Coffee (NO MILK/CREAM OR CREAMERS, sugar ok)  Clear Tea (NO MILK/CREAM OR CREAMERS, sugar ok) regular and decaf                             Plain Jell-O (NO RED)                                           Fruit ices (not with fruit pulp, NO RED)                                     Popsicles (NO RED)                                                               Sports drinks like Gatorade (NO RED)              Drink 2 G2 drinks AT 10:00 PM the night before surgery.        The day of surgery:  Drink ONE (1) Pre-Surgery G2 at 9:45 AM the morning of surgery. Drink in one sitting. Do not sip.  This drink was given to you during your hospital  pre-op appointment visit. Nothing else to drink after completing the  Pre-Surgery G2.          If  you have questions, please contact your surgeon's office.   FOLLOW BOWEL PREP AND ANY ADDITIONAL PRE OP INSTRUCTIONS YOU RECEIVED FROM YOUR SURGEON'S OFFICE!!!     Oral Hygiene is also important to reduce your risk of infection.                                    Remember - BRUSH YOUR TEETH THE MORNING OF SURGERY WITH YOUR REGULAR TOOTHPASTE  DENTURES WILL BE REMOVED PRIOR TO SURGERY PLEASE DO NOT APPLY Poly grip OR ADHESIVES!!!   Stop all vitamins and herbal supplements 7 days before surgery.   Take these medicines the morning of surgery with A SIP OF WATER: Tylenol , Rosuvastatin    DO NOT TAKE ANY ORAL DIABETIC MEDICATIONS DAY OF YOUR SURGERY  How to Manage Your Diabetes Before and After Surgery  Why is it important to control my blood sugar before and after surgery? Improving blood sugar levels before and after surgery helps healing and can limit problems. A way of improving blood sugar control is eating a healthy diet by:  Eating less sugar and carbohydrates  Increasing activity/exercise  Talking with your doctor about reaching your blood sugar goals High blood sugars (greater than 180 mg/dL) can raise your risk of infections and slow your recovery, so you will need to focus on controlling your diabetes during the weeks before surgery. Make sure that the doctor who takes care of your diabetes knows about your planned surgery including the date and location.  How do I manage my blood sugar before surgery? Check your blood sugar at least 4 times a day, starting 2 days before surgery, to make sure that the level is not too high or low. Check your blood sugar the morning of your surgery when you wake up and every 2 hours until you get to the Short Stay unit. If your blood sugar is less than 70 mg/dL, you will need to treat for low blood sugar: Do not take insulin . Treat a low blood sugar (less than 70 mg/dL) with  cup of clear juice (cranberry or apple), 4 glucose tablets, OR  glucose gel. Recheck blood sugar in 15 minutes after treatment (to make sure it is greater than 70 mg/dL). If your blood sugar is not greater than 70 mg/dL on recheck, call [phone] for further instructions. Report your blood sugar to the short stay nurse when you get to Short Stay.  If you are admitted to the hospital after surgery: Your blood sugar will be checked by the staff and you will probably be given insulin  after surgery (instead of oral diabetes medicines) to make sure you have good blood sugar levels. The goal for blood sugar control after surgery is 80-180 mg/dL.   WHAT DO I DO ABOUT MY DIABETES MEDICATION?  Do not take oral diabetes medicines (pills) the morning of surgery.  Hold Jardiance  for 3 days prior to surgery. Do not take after 01/13/24.  Hold Ozempic  7 days prior to surgery. Do not take after 01/09/24.  DO NOT TAKE THE FOLLOWING 7 DAYS PRIOR TO SURGERY: Ozempic , Wegovy, Rybelsus (Semaglutide ), Byetta (exenatide), Bydureon (exenatide ER), Victoza, Saxenda (liraglutide), or Trulicity (dulaglutide) Mounjaro (Tirzepatide) Adlyxin (Lixisenatide), Polyethylene Glycol Loxenatide.  Reviewed and Endorsed by [facility] Patient Education Committee, August 2015                              You may not have any metal on your body including jewelry, and body piercing             Do not wear lotions, powders, cologne, or deodorant              Men may shave face and neck.   Do not bring valuables to the hospital. [facility] IS NOT             RESPONSIBLE   FOR VALUABLES.   Contacts,  glasses, dentures or bridgework may not be worn into surgery.   Bring small overnight bag day of surgery.   DO NOT BRING YOUR HOME MEDICATIONS TO THE HOSPITAL. PHARMACY WILL DISPENSE MEDICATIONS LISTED ON YOUR MEDICATION LIST TO YOU DURING YOUR ADMISSION IN THE HOSPITAL!   Special Instructions: Bring a copy of your healthcare power of attorney and living will documents the day of surgery if  you haven't scanned them before.              Please read over the following fact sheets you were given: IF YOU HAVE QUESTIONS ABOUT YOUR PRE-OP INSTRUCTIONS PLEASE CALL [phone] [address] [place].   If you received a COVID test during your pre-op visit  it is requested that you wear a mask when out in public, stay away from anyone that may not be feeling well and notify your surgeon if you develop symptoms. If you test positive for Covid or have been in contact with anyone that has tested positive in the last 10 days please notify you surgeon.    [facility] - Preparing for Surgery Before surgery, you can play an important role.  Because skin is not sterile, your skin needs to be as free of germs as possible.  You can reduce the number of germs on your skin by washing with CHG (chlorahexidine gluconate) soap before surgery.  CHG is an antiseptic cleaner which kills germs and bonds with the skin to continue killing germs even after washing. Please DO NOT use if you have an allergy to CHG or antibacterial soaps.  If your skin becomes reddened/irritated stop using the CHG and inform your nurse when you arrive at Short Stay. Do not shave (including legs and underarms) for at least 48 hours prior to the first CHG shower.  You may shave your face/neck.  Please follow these instructions carefully:  1.  Shower with CHG Soap the night before surgery and the  morning of surgery.  2.  If you choose to wash your hair, wash your hair first as usual with your normal  shampoo.  3.  After you shampoo, rinse your hair and body thoroughly to remove the shampoo.                             4.  Use CHG as you would any other liquid soap.  You can apply chg directly to the skin and wash.  Gently with a scrungie or clean washcloth.  5.  Apply the CHG Soap to your body ONLY FROM THE NECK DOWN.   Do   not use on face/ open                           Wound or open sores. Avoid contact with eyes, ears mouth and   genitals  (private parts).                       Wash face,  Genitals (private parts) with your normal soap.             6.  Wash thoroughly, paying special attention to the area where your    surgery  will be performed.  7.  Thoroughly rinse your body with warm water from the neck down.  8.  DO NOT shower/wash with your normal soap after using and rinsing off the CHG Soap.  9.  Pat yourself dry with a clean towel.            10.  Wear clean pajamas.            11.  Place clean sheets on your bed the night of your first shower and do not  sleep with pets. Day of Surgery : Do not apply any lotions/deodorants the morning of surgery.  Please wear clean clothes to the hospital/surgery center.  FAILURE TO FOLLOW THESE INSTRUCTIONS MAY RESULT IN THE CANCELLATION OF YOUR SURGERY  PATIENT SIGNATURE_________________________________  NURSE SIGNATURE__________________________________  ________________________________________________________________________  Joshua [name]  An incentive spirometer is a tool that can help keep your lungs clear and active. This tool measures how well you are filling your lungs with each breath. Taking long deep breaths may help reverse or decrease the chance of developing breathing (pulmonary) problems (especially infection) following: A long period of time when you are unable to move or be active. BEFORE THE PROCEDURE  If the spirometer includes an indicator to show your best effort, your nurse or respiratory therapist will set it to a desired goal. If possible, sit up straight or lean slightly forward. Try not to slouch. Hold the incentive spirometer in an upright position. INSTRUCTIONS FOR USE  Sit on the edge of your bed if possible, or sit up as far as you can in bed or on a chair. Hold the incentive spirometer in an upright position. Breathe out normally. Place the mouthpiece in your mouth and seal your lips tightly around it. Breathe in slowly and  as deeply as possible, raising the piston or the ball toward the top of the column. Hold your breath for 3-5 seconds or for as long as possible. Allow the piston or ball to fall to the bottom of the column. Remove the mouthpiece from your mouth and breathe out normally. Rest for a few seconds and repeat Steps 1 through 7 at least 10 times every 1-2 hours when you are awake. Take your time and take a few normal breaths between deep breaths. The spirometer may include an indicator to show your best effort. Use the indicator as a goal to work toward during each repetition. After each set of 10 deep breaths, practice coughing to be sure your lungs are clear. If you have an incision (the cut made at the time of surgery), support your incision when coughing by placing a pillow or rolled up towels firmly against it. Once you are able to get out of bed, walk around indoors and cough well. You may stop using the incentive spirometer when instructed by your caregiver.  RISKS AND COMPLICATIONS Take your time so you do not get dizzy or light-headed. If you are in pain, you may need to take or ask for pain medication before doing incentive spirometry. It is harder to take a deep breath if you are having pain. AFTER USE Rest and breathe slowly and easily. It can be helpful to keep track of a log of your progress. Your caregiver can provide you with a simple table to help with this. If you are using the spirometer at home, follow these instructions: SEEK MEDICAL CARE IF:  You are having difficultly using the spirometer. You have trouble using the spirometer as often as instructed. Your pain medication is not giving enough relief while using the spirometer. You develop fever of 100.5 F (38.1 C) or higher. SEEK IMMEDIATE MEDICAL CARE IF:  You cough up bloody sputum that had not  been present before. You develop fever of 102 F (38.9 C) or greater. You develop worsening pain at or near the incision site. MAKE  SURE YOU:  Understand these instructions. Will watch your condition. Will get help right away if you are not doing well or get worse. Document Released: 10/02/2006 Document Revised: 08/14/2011 Document Reviewed: 12/03/2006 ExitCare Patient Information 2014 ExitCare, [place].   ________________________________________________________________________  WHAT IS A BLOOD TRANSFUSION? Blood Transfusion Information  A transfusion is the replacement of blood or some of its parts. Blood is made up of multiple cells which provide different functions. Red blood cells carry oxygen and are used for blood loss replacement. White blood cells fight against infection. Platelets control bleeding. Plasma helps clot blood. Other blood products are available for specialized needs, such as hemophilia or other clotting disorders. BEFORE THE TRANSFUSION  Who gives blood for transfusions?  Healthy volunteers who are fully evaluated to make sure their blood is safe. This is blood bank blood. Transfusion therapy is the safest it has ever been in the practice of medicine. Before blood is taken from a donor, a complete history is taken to make sure that person has no history of diseases nor engages in risky social behavior (examples are intravenous drug use or sexual activity with multiple partners). The donor's travel history is screened to minimize risk of transmitting infections, such as malaria. The donated blood is tested for signs of infectious diseases, such as HIV and hepatitis. The blood is then tested to be sure it is compatible with you in order to minimize the chance of a transfusion reaction. If you or a relative donates blood, this is often done in anticipation of surgery and is not appropriate for emergency situations. It takes many days to process the donated blood. RISKS AND COMPLICATIONS Although transfusion therapy is very safe and saves many lives, the main dangers of transfusion include:  Getting an  infectious disease. Developing a transfusion reaction. This is an allergic reaction to something in the blood you were given. Every precaution is taken to prevent this. The decision to have a blood transfusion has been considered carefully by your caregiver before blood is given. Blood is not given unless the benefits outweigh the risks. AFTER THE TRANSFUSION Right after receiving a blood transfusion, you will usually feel much better and more energetic. This is especially true if your red blood cells have gotten low (anemic). The transfusion raises the level of the red blood cells which carry oxygen, and this usually causes an energy increase. The nurse administering the transfusion will monitor you carefully for complications. HOME CARE INSTRUCTIONS  No special instructions are needed after a transfusion. You may find your energy is better. Speak with your caregiver about any limitations on activity for underlying diseases you may have. SEEK MEDICAL CARE IF:  Your condition is not improving after your transfusion. You develop redness or irritation at the intravenous (IV) site. SEEK IMMEDIATE MEDICAL CARE IF:  Any of the following symptoms occur over the next 12 hours: Shaking chills. You have a temperature by mouth above 102 F (38.9 C), not controlled by medicine. Chest, back, or muscle pain. People around you feel you are not acting correctly or are confused. Shortness of breath or difficulty breathing. Dizziness and fainting. You get a rash or develop hives. You have a decrease in urine output. Your urine turns a dark color or changes to pink, red, or brown. Any of the following symptoms occur over the next 10 days: You  have a temperature by mouth above 102 F (38.9 C), not controlled by medicine. Shortness of breath. Weakness after normal activity. The white part of the eye turns yellow (jaundice). You have a decrease in the amount of urine or are urinating less often. Your urine  turns a dark color or changes to pink, red, or brown. Document Released: 05/19/2000 Document Revised: 08/14/2011 Document Reviewed: 01/06/2008 [facility] Patient Information 2014 [place], [place].  _______________________________________________________________________

## 2024-01-10 ENCOUNTER — Encounter (HOSPITAL_COMMUNITY)
Admission: RE | Admit: 2024-01-10 | Discharge: 2024-01-10 | Disposition: A | Discharge status: Home / Self Care | Source: Ambulatory Visit | Attending: Surgery | Admitting: Surgery

## 2024-01-10 ENCOUNTER — Encounter (HOSPITAL_COMMUNITY): Payer: Self-pay

## 2024-01-10 ENCOUNTER — Other Ambulatory Visit: Payer: Self-pay

## 2024-01-10 VITALS — BP 135/67 | HR 64 | Temp 98.2°F | Ht 68.0 in | Wt 194.0 lb

## 2024-01-10 DIAGNOSIS — Z01818 Encounter for other preprocedural examination: Secondary | ICD-10-CM | POA: Diagnosis not present

## 2024-01-10 DIAGNOSIS — E1122 Type 2 diabetes mellitus with diabetic chronic kidney disease: Secondary | ICD-10-CM | POA: Insufficient documentation

## 2024-01-10 DIAGNOSIS — Z8673 Personal history of transient ischemic attack (TIA), and cerebral infarction without residual deficits: Secondary | ICD-10-CM | POA: Diagnosis not present

## 2024-01-10 DIAGNOSIS — E1165 Type 2 diabetes mellitus with hyperglycemia: Secondary | ICD-10-CM

## 2024-01-10 DIAGNOSIS — N189 Chronic kidney disease, unspecified: Secondary | ICD-10-CM | POA: Diagnosis not present

## 2024-01-10 DIAGNOSIS — K5732 Diverticulitis of large intestine without perforation or abscess without bleeding: Secondary | ICD-10-CM | POA: Insufficient documentation

## 2024-01-10 DIAGNOSIS — Z933 Colostomy status: Secondary | ICD-10-CM | POA: Diagnosis not present

## 2024-01-10 DIAGNOSIS — I129 Hypertensive chronic kidney disease with stage 1 through stage 4 chronic kidney disease, or unspecified chronic kidney disease: Secondary | ICD-10-CM | POA: Insufficient documentation

## 2024-01-10 HISTORY — DX: Hidradenitis suppurativa: L73.2

## 2024-01-10 HISTORY — DX: Pneumonia, unspecified organism: J18.9

## 2024-01-10 LAB — CBC WITH DIFFERENTIAL/PLATELET
Abs Immature Granulocytes: 0.01 K/uL (ref 0.00–0.07)
Basophils Absolute: 0 K/uL (ref 0.0–0.1)
Basophils Relative: 1 %
Eosinophils Absolute: 0.1 K/uL (ref 0.0–0.5)
Eosinophils Relative: 2 %
HCT: 51.5 % (ref 39.0–52.0)
Hemoglobin: 17.4 g/dL — ABNORMAL HIGH (ref 13.0–17.0)
Immature Granulocytes: 0 %
Lymphocytes Relative: 30 %
Lymphs Abs: 2.1 K/uL (ref 0.7–4.0)
MCH: 32.3 pg (ref 26.0–34.0)
MCHC: 33.8 g/dL (ref 30.0–36.0)
MCV: 95.5 fL (ref 80.0–100.0)
Monocytes Absolute: 0.5 K/uL (ref 0.1–1.0)
Monocytes Relative: 7 %
Neutro Abs: 4.4 K/uL (ref 1.7–7.7)
Neutrophils Relative %: 60 %
Platelets: 219 K/uL (ref 150–400)
RBC: 5.39 MIL/uL (ref 4.22–5.81)
RDW: 13.5 % (ref 11.5–15.5)
WBC: 7.2 K/uL (ref 4.0–10.5)
nRBC: 0 % (ref 0.0–0.2)

## 2024-01-10 LAB — COMPREHENSIVE METABOLIC PANEL WITH GFR
ALT: 43 U/L (ref 0–44)
AST: 29 U/L (ref 15–41)
Albumin: 4.2 g/dL (ref 3.5–5.0)
Alkaline Phosphatase: 88 U/L (ref 38–126)
Anion gap: 11 (ref 5–15)
BUN: 16 mg/dL (ref 8–23)
CO2: 22 mmol/L (ref 22–32)
Calcium: 9.6 mg/dL (ref 8.9–10.3)
Chloride: 105 mmol/L (ref 98–111)
Creatinine, Ser: 0.91 mg/dL (ref 0.61–1.24)
GFR, Estimated: >60 mL/min (ref >60)
Glucose, Bld: 156 mg/dL — ABNORMAL HIGH (ref 70–99)
Potassium: 4.7 mmol/L (ref 3.5–5.1)
Sodium: 138 mmol/L (ref 135–145)
Total Bilirubin: 0.7 mg/dL (ref 0.0–1.2)
Total Protein: 8.2 g/dL — ABNORMAL HIGH (ref 6.5–8.1)

## 2024-01-10 LAB — GLUCOSE, CAPILLARY: Glucose-Capillary: 180 mg/dL — ABNORMAL HIGH (ref 70–99)

## 2024-01-10 LAB — HEMOGLOBIN A1C
Hgb A1c MFr Bld: 7.1 % — ABNORMAL HIGH (ref 4.8–5.6)
Mean Plasma Glucose: 157 mg/dL

## 2024-01-10 NOTE — Progress Notes (Unsigned)
 [facility] History and Physical   Primary Care Physician:  Micheal Wolm ORN, MD   Reason for Procedure:  History of colon polyps, preoperative exam  Plan:    Colonoscopy     HPI: Joshua [name] is a 68 y.o. male with a history of colon polyps and is status post colectomy and Hartman's procedure with colostomy because of diverticulitis in 2024.  He is awaiting colostomy takedown and needs a preoperative colonoscopy in the setting that includes a history of colon polyps.  He has a parastomal hernia as well.  Additional problems include a history of cryptogenic stroke earlier this year, psoriatic arthritis and hidradenitis suppurativa.   Past Medical History:  Diagnosis Date   Anal fistula    Chronic kidney disease    Hidradenitis    History of bladder cancer 2009   urologist-- dr rosalind---  s/p TURBT 2010,  recurrence in office 2014 ,  none since per pt   History of colonic polyps    History of COVID-19 04/2019   per pt had covid pneumonia recovered at home, no oxygen, symptoms resolved   History of diverticulitis of colon    History of hypertension 04/05/2009   per the pt he lost 55 lbs and htn has been reversed   History of kidney stones    Hydradenitis    Hyperlipidemia 04/05/2009   pt states he does not take med for cholesterol   Hypertension    OA (osteoarthritis) 04/05/2009   Pneumonia    Psoriatic arthritis (HCC)    rheumonotologist--- dr ronal jacob   Stroke [facility])    2024   Type 2 diabetes mellitus (HCC) 04/05/2009   followed by pcp---  (01-31-2021  per pt only checks blood sugar once weekly, last fasting sugar-- 122)    Past Surgical History:  Procedure Laterality Date   COLONOSCOPY     last one 11-04-2020  by Deola Rewis   EXTRACORPOREAL SHOCK WAVE LITHOTRIPSY  2015   INCISION AND DRAINAGE ABSCESS N/A 02/03/2021   Procedure: EXCISION OF PERIANAL AND PERINEAL HYDRADENITIS, INTERROGATION OF PERIANAL WOUNDS;  Surgeon: Teresa Lonni HERO, MD;   Location: [facility];  Service: General;  Laterality: N/A;   PARTIAL COLECTOMY N/A 02/26/2023   Procedure: EXPLORATORY LAPAROTOMY; PARTIAL COLECTOMY; COLOSTOMY;  Surgeon: Vernetta Berg, MD;  Location: [facility];  Service: General;  Laterality: N/A;   RECTAL EXAM UNDER ANESTHESIA  02/03/2021   Procedure: ANORECTAL EXAM UNDER ANESTHESIA;  Surgeon: Teresa Lonni HERO, MD;  Location: [facility];  Service: General;;   TONSILLECTOMY     child   TRANSURETHRAL RESECTION OF BLADDER TUMOR  02/2008     Current Outpatient Medications  Medication Sig Dispense Refill   aspirin 81 MG chewable tablet Chew 81 mg by mouth daily.     BAYER MICROLET LANCETS lancets Check Blood Sugar Once Daily. 30 each 5   empagliflozin  (JARDIANCE ) 10 MG TABS tablet Take 1 tablet (10 mg total) by mouth daily. 90 tablet 1   ONETOUCH ULTRA test strip USE AS DIRECTED TO CHECK BLOOD GLUCOSE ONE TIME DAILY 100 strip 10   polyethylene glycol (MIRALAX  / GLYCOLAX ) 17 g packet Take 17 g by mouth daily as needed for mild constipation. 14 each 0   rosuvastatin  (CRESTOR ) 10 MG tablet Take 1 tablet (10 mg total) by mouth daily. 90 tablet 0   acetaminophen  (TYLENOL ) 500 MG tablet Take 2 tablets (1,000 mg total) by mouth every 6 (six) hours as needed. 30 tablet 0  betamethasone  dipropionate 0.05 % lotion Apply 1 Application topically daily as needed (irritation).     CINNAMON PO Take 1 capsule by mouth daily. (Patient not taking: Reported on 01/11/2024)     clindamycin (CLEOCIN T) 1 % lotion Apply 1 application  topically daily as needed (irritation).     COSENTYX SENSOREADY, 300 MG, 150 MG/ML SOAJ Inject 300 mg into the skin every 28 (twenty-eight) days.     Pediatric Multivitamins-Fl (MULTIVITAMIN DROPS/FLUORIDE PO) Take 1 tablet by mouth daily.     Semaglutide ,0.25 or 0.5MG /DOS, (OZEMPIC , 0.25 OR 0.5 MG/DOSE,) 2 MG/3ML SOPN Inject 0.25 mg into the skin once a week. 3 mL 2   triamcinolone  cream (KENALOG) 0.1  % Apply 1 Application topically 2 (two) times daily as needed (irritation).     Current Facility-Administered Medications  Medication Dose Route Frequency Provider Last Rate Last Admin   0.9 %  sodium chloride  infusion  500 mL Intravenous Once Avram Lupita BRAVO, MD        Allergies as of 01/11/2024 - Review Complete 01/11/2024  Allergen Reaction Noted   Penicillin g Hives 09/08/2020   Penicillins Hives 04/05/2009   Adalimumab Other (See Comments) 09/08/2020   Metformin  and related Other (See Comments) 10/12/2023    Family History  Problem Relation Age of Onset   Diabetes Mother    Hypertension Mother    Arthritis Father    Crohn's disease Daughter    Esophageal cancer Neg Hx    Colon cancer Neg Hx    Pancreatic cancer Neg Hx    Stomach cancer Neg Hx    Colon polyps Neg Hx    Rectal cancer Neg Hx     Social History   Socioeconomic History   Marital status: Married    Spouse name: Not on file   Number of children: Not on file   Years of education: Not on file   Highest education level: Bachelor's degree (e.g., BA, AB, BS)  Occupational History   Not on file  Tobacco Use   Smoking status: Some Days    Types: Cigarettes, Cigars   Smokeless tobacco: Never   Tobacco comments:    01-31-2021  per pt quit cigarettes 1981 (age 54) for 8 yrs;  since then has smoked cigar's socially  Vaping Use   Vaping status: Never Used  Substance and Sexual Activity   Alcohol use: Yes    Alcohol/week: 14.0 standard drinks of alcohol    Types: 14 Cans of beer per week   Drug use: No   Sexual activity: Not on file  Other Topics Concern   Not on file  Social History Narrative   Patient is married, a daughter has Crohn's disease   No alcohol or drug use he is an intermittent smoker of cigarettes and cigars   Social Drivers of Corporate investment banker Strain: Low Risk  (07/10/2023)   Overall Financial Resource Strain (CARDIA)    Difficulty of Paying Living Expenses: Not hard at all   Food Insecurity: No Food Insecurity (07/10/2023)   Hunger Vital Sign    Worried About Running Out of Food in the Last Year: Never true    Ran Out of Food in the Last Year: Never true  Transportation Needs: No Transportation Needs (07/10/2023)   PRAPARE - Administrator, Civil Service (Medical): No    Lack of Transportation (Non-Medical): No  Physical Activity: Insufficiently Active (07/10/2023)   Exercise Vital Sign    Days of Exercise per Week:  4 days    Minutes of Exercise per Session: 30 min  Stress: No Stress Concern Present (07/10/2023)   [employer] of Occupational Health - Occupational Stress Questionnaire    Feeling of Stress : Not at all  Social Connections: Socially Integrated (07/10/2023)   Social Connection and Isolation Panel    Frequency of Communication with Friends and Family: Twice a week    Frequency of Social Gatherings with Friends and Family: Once a week    Attends Religious Services: More than 4 times per year    Active Member of [employer] or Organizations: Yes    Attends Engineer, structural: More than 4 times per year    Marital Status: Married  Catering manager Violence: Not At Risk (02/20/2023)   Humiliation, Afraid, Rape, and Kick questionnaire    Fear of Current or Ex-Partner: No    Emotionally Abused: No    Physically Abused: No    Sexually Abused: No    Review of Systems:  All other review of systems negative except as mentioned in the HPI.  Physical Exam: Vital signs BP 138/84   Pulse 89   Temp 98 F (36.7 C)   Resp 17   Ht 5' 8 (1.727 m)   Wt 190 lb (86.2 kg)   SpO2 99%   BMI 28.89 kg/m   General:   Alert,  Well-developed, well-nourished, pleasant and cooperative in NAD Lungs:  Clear throughout to auscultation.   Heart:  Regular rate and rhythm; no murmurs, clicks, rubs,  or gallops. Abdomen:  Soft, nontender and nondistended. Normal bowel sounds.  LLQ colostomy Neuro/Psych:  Alert and cooperative. Normal mood and  affect. A and O x 3   @Dailynn Nancarrow  CHARLENA Commander, MD, [facility] [phone] (pager) 01/11/2024 3:16 PM@

## 2024-01-11 ENCOUNTER — Encounter: Payer: Self-pay | Admitting: Internal Medicine

## 2024-01-11 ENCOUNTER — Ambulatory Visit: Admitting: Internal Medicine

## 2024-01-11 VITALS — BP 126/77 | HR 79 | Temp 98.0°F | Resp 12 | Ht 68.0 in | Wt 190.0 lb

## 2024-01-11 DIAGNOSIS — K5730 Diverticulosis of large intestine without perforation or abscess without bleeding: Secondary | ICD-10-CM | POA: Diagnosis not present

## 2024-01-11 DIAGNOSIS — D120 Benign neoplasm of cecum: Secondary | ICD-10-CM | POA: Diagnosis not present

## 2024-01-11 DIAGNOSIS — Z1211 Encounter for screening for malignant neoplasm of colon: Secondary | ICD-10-CM | POA: Diagnosis not present

## 2024-01-11 DIAGNOSIS — D123 Benign neoplasm of transverse colon: Secondary | ICD-10-CM

## 2024-01-11 DIAGNOSIS — Z860100 Personal history of colon polyps, unspecified: Secondary | ICD-10-CM | POA: Diagnosis not present

## 2024-01-11 DIAGNOSIS — Z860101 Personal history of adenomatous and serrated colon polyps: Secondary | ICD-10-CM

## 2024-01-11 DIAGNOSIS — K635 Polyp of colon: Secondary | ICD-10-CM

## 2024-01-11 DIAGNOSIS — Z9049 Acquired absence of other specified parts of digestive tract: Secondary | ICD-10-CM | POA: Diagnosis not present

## 2024-01-11 DIAGNOSIS — K5732 Diverticulitis of large intestine without perforation or abscess without bleeding: Secondary | ICD-10-CM

## 2024-01-11 DIAGNOSIS — Z933 Colostomy status: Secondary | ICD-10-CM

## 2024-01-11 MED ORDER — SODIUM CHLORIDE 0.9 % IV SOLN: 500.0000 mL | Freq: Once | INTRAVENOUS | Status: DC

## 2024-01-11 NOTE — Op Note (Signed)
 [facility] Patient Name: Joshua [name] Procedure Date: 01/11/2024 2:56 PM MRN: [id number] Endoscopist: Lupita FORBES Commander , MD, [id number] Age: 68 Referring MD:  Date of Birth: 11/15/1955 Gender: Male Account #: [id number] Procedure:                Colonoscopy Indications:              Surveillance: Personal history of adenomatous                            polyps on last colonoscopy 3 years ago, Last                            colonoscopy: 2022 Medicines:                Monitored Anesthesia Care Procedure:                Pre-Anesthesia Assessment:                           - Prior to the procedure, a History and Physical                            was performed, and patient medications and                            allergies were reviewed. The patient's tolerance of                            previous anesthesia was also reviewed. The risks                            and benefits of the procedure and the sedation                            options and risks were discussed with the patient.                            All questions were answered, and informed consent                            was obtained. Prior Anticoagulants: The patient has                            taken no anticoagulant or antiplatelet agents. ASA                            Grade Assessment: III - A patient with severe                            systemic disease. After reviewing the risks and                            benefits, the patient was deemed in satisfactory  condition to undergo the procedure.                           After obtaining informed consent, the colonoscope                            was passed under direct vision. Throughout the                            procedure, the patient's blood pressure, pulse, and                            oxygen saturations were monitored continuously. The                            Olympus Scope [id number] was introduced through  the                            sigmoid colostomy and advanced to the the cecum,                            identified by appendiceal orifice and ileocecal                            valve. The colonoscopy was performed without                            difficulty. The patient tolerated the procedure                            well. The quality of the bowel preparation was                            good. The ileocecal valve, appendiceal orifice, and                            rectum were photographed. The bowel preparation                            used was Miralax  via split dose instruction. Scope In: 3:27:25 PM Scope Out: 3:43:37 PM Scope Withdrawal Time: 0 hours 11 minutes 46 seconds  Total Procedure Duration: 0 hours 16 minutes 12 seconds  Findings:                 The perianal and digital rectal examinations were                            normal.                           There was evidence of a prior end sigmoid colostomy                            in the sigmoid colon. This was patent.  Two sessile polyps were found in the transverse                            colon and cecum. The polyps were diminutive in                            size. These polyps were removed with a cold snare.                            Resection and retrieval were complete. Verification                            of patient identification for the specimen was                            done. Estimated blood loss was minimal.                           Multiple diverticula were found in the sigmoid                            colon, descending colon and transverse colon.                           The exam was otherwise without abnormality.Rectal                            pouch examined - limitied views Complications:            No immediate complications. Estimated Blood Loss:     Estimated blood loss was minimal. Estimated blood                            loss was  minimal. Impression:               - Patent end sigmoid colostomy.                           - Two diminutive polyps in the transverse colon and                            in the cecum, removed with a cold snare. Resected                            and retrieved.                           - Diverticulosis in the sigmoid colon, in the                            descending colon and in the transverse colon.                           - The examination was otherwise normal. Examined  rectal pouch but limited views due to retained                            mucous                           - Personal history of colonic polyps. 03/2014 - 7                            mm ssp -                           05/04/20 30 mm cecal adenoma                           June 2022-diminutive SSP no residual adenoma Recommendation:           - Patient has a contact number available for                            emergencies. The signs and symptoms of potential                            delayed complications were discussed with the                            patient. Return to normal activities tomorrow.                            Written discharge instructions were provided to the                            patient.                           - Resume previous diet.                           - Continue present medications.                           - Repeat colonoscopy is recommended for                            surveillance. The colonoscopy date will be                            determined after pathology results from today's                            exam become available for review. Lupita FORBES Commander, MD 01/11/2024 3:57:24 PM This report has been signed electronically.

## 2024-01-11 NOTE — Progress Notes (Signed)
 Called to room to assist during endoscopic procedure.  Patient ID and intended procedure confirmed with present staff. Received instructions for my participation in the procedure from the performing physician.

## 2024-01-11 NOTE — Progress Notes (Signed)
 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 Lonni Teresa and Dr. Steffan Shane.   S/p exlap 02/26/23. Per notes went straight to glidescope, no anesthesia complications noted.   Right MCA stroke 05/2023. Last seen by neurology 11/06/2023. Per notes making great recovery, has mild left hand decreased dexterity and mild hand numbness. Per notes, As he is 6 months post stroke, okay to proceed with surgical procedure with small but acceptable risk of recurrent stroke while off aspirin therapy, can hold aspirin 3-5 days or as requested by surgeon and restart immediately after or once cleared by surgeon.  Per cardiology preoperative evaluation 11/14/23,  Preoperative Cardiovascular Risk Assessment: According to the Revised Cardiac Risk Index (RCRI), his Perioperative Risk of Major Cardiac Event is (%): 6.6. His Functional Capacity in METs is: 8.33 according to the Duke Activity Status Index (DASI). The patient is doing well from a cardiac perspective. Therefore, based on ACC/AHA guidelines, the patient would be at acceptable risk for the planned procedure without further cardiovascular testing.   VS: BP 135/67   Pulse 64   Temp 36.8 C (Oral)   Ht 5' 8 (1.727 m)   Wt 88 kg   SpO2 100%   BMI  29.50 kg/m   PROVIDERS: Micheal Wolm ORN, MD is PCP  Cardiologist - Ole Holts, MD  Neurologist- Modena Callander, MD    LABS: Labs reviewed: Acceptable for surgery. (all labs ordered are listed, but only abnormal results are displayed)  Labs Reviewed  CBC WITH DIFFERENTIAL/PLATELET - Abnormal; Notable for the following components:      Result Value   Hemoglobin 17.4 (*)    All other components within normal limits  COMPREHENSIVE METABOLIC PANEL WITH GFR - Abnormal; Notable for the following components:   Glucose, Bld 156 (*)    Total Protein 8.2 (*)    All other components within normal limits  HEMOGLOBIN A1C - Abnormal; Notable for the following components:   Hgb A1c MFr Bld 7.1 (*)    All other components within normal limits  GLUCOSE, CAPILLARY - Abnormal; Notable for the following components:   Glucose-Capillary 180 (*)    All other components within normal limits  TYPE AND SCREEN     IMAGES:   EKG:   CV: Echo 06/29/2023 1. Left ventricular ejection fraction, by estimation, is 60 to 65%. Left  ventricular ejection fraction by 3D volume is 67 %. The left ventricle has  normal function. The left ventricle has no regional wall motion  abnormalities. Left ventricular diastolic   parameters are consistent with Grade I diastolic dysfunction (impaired  relaxation).   2. Right ventricular systolic function is normal. The right ventricular  size is normal.   3. The mitral valve is normal in structure. No evidence  of mitral valve  regurgitation. No evidence of mitral stenosis.   4. The aortic valve is tricuspid. Aortic valve regurgitation is not  visualized. No aortic stenosis is present.   5. Aortic dilatation noted. There is borderline dilatation of the aortic  root, measuring 38 mm.   6. The inferior vena cava is normal in size with greater than 50%  respiratory variability, suggesting right atrial pressure of 3 mmHg.  Past Medical History:  Diagnosis Date   Anal  fistula    Chronic kidney disease    Hidradenitis    History of bladder cancer 2009   urologist-- dr rosalind---  s/p TURBT 2010,  recurrence in office 2014 ,  none since per pt   History of colonic polyps    History of COVID-19 04/2019   per pt had covid pneumonia recovered at home, no oxygen, symptoms resolved   History of diverticulitis of colon    History of hypertension 04/05/2009   per the pt he lost 55 lbs and htn has been reversed   History of kidney stones    Hydradenitis    Hyperlipidemia 04/05/2009   pt states he does not take med for cholesterol   Hypertension    OA (osteoarthritis) 04/05/2009   Pneumonia    Psoriatic arthritis (HCC)    rheumonotologist--- dr ronal jacob   Stroke [facility])    2024   Type 2 diabetes mellitus (HCC) 04/05/2009   followed by pcp---  (01-31-2021  per pt only checks blood sugar once weekly, last fasting sugar-- 122)    Past Surgical History:  Procedure Laterality Date   COLONOSCOPY     last one 11-04-2020  by gessner   EXTRACORPOREAL SHOCK WAVE LITHOTRIPSY  2015   INCISION AND DRAINAGE ABSCESS N/A 02/03/2021   Procedure: EXCISION OF PERIANAL AND PERINEAL HYDRADENITIS, INTERROGATION OF PERIANAL WOUNDS;  Surgeon: Teresa Lonni HERO, MD;  Location: [facility];  Service: General;  Laterality: N/A;   PARTIAL COLECTOMY N/A 02/26/2023   Procedure: EXPLORATORY LAPAROTOMY; PARTIAL COLECTOMY; COLOSTOMY;  Surgeon: Vernetta Berg, MD;  Location: [facility];  Service: General;  Laterality: N/A;   RECTAL EXAM UNDER ANESTHESIA  02/03/2021   Procedure: ANORECTAL EXAM UNDER ANESTHESIA;  Surgeon: Teresa Lonni HERO, MD;  Location: [facility];  Service: General;;   TONSILLECTOMY     child   TRANSURETHRAL RESECTION OF BLADDER TUMOR  02/2008    MEDICATIONS:  acetaminophen  (TYLENOL ) 500 MG tablet   aspirin 81 MG chewable tablet   BAYER MICROLET LANCETS lancets   betamethasone  dipropionate 0.05 % lotion   CINNAMON PO    clindamycin (CLEOCIN T) 1 % lotion   COSENTYX SENSOREADY, 300 MG, 150 MG/ML SOAJ   empagliflozin  (JARDIANCE ) 10 MG TABS tablet   inFLIXimab -axxq (AVSOLA ) 100 MG injection   ONETOUCH ULTRA test strip   Pediatric Multivitamins-Fl (MULTIVITAMIN DROPS/FLUORIDE PO)   polyethylene glycol (MIRALAX  / GLYCOLAX ) 17 g packet   rosuvastatin  (CRESTOR ) 10 MG tablet   Semaglutide ,0.25 or 0.5MG /DOS, (OZEMPIC , 0.25 OR 0.5 MG/DOSE,) 2 MG/3ML SOPN   triamcinolone  cream (KENALOG) 0.1 %   No current facility-administered medications for this encounter.    Harlene Hoots Ward, PA-C [facility] [phone]

## 2024-01-11 NOTE — Progress Notes (Signed)
 VS by DT  Pt's states no medical or surgical changes since previsit or office visit.

## 2024-01-11 NOTE — Patient Instructions (Addendum)
 I found and removed 2 tiny polyps. I will let you know pathology results and when to have another routine colonoscopy by mail and/or My Chart.  Some diverticulosis was seen.  OK to have colostomy takedown - Metta Sours!  I appreciate the opportunity to care for you. Lupita CHARLENA Commander, MD, [facility]  Please see handouts regarding Polyps and Diverticulosis. Continue previous diet and current medications. Await pathology results.  YOU HAD AN ENDOSCOPIC PROCEDURE TODAY AT THE [facility]:   Refer to the procedure report that was given to you for any specific questions about what was found during the examination.  If the procedure report does not answer your questions, please call your gastroenterologist to clarify.  If you requested that your care partner not be given the details of your procedure findings, then the procedure report has been included in a sealed envelope for you to review at your convenience later.  YOU SHOULD EXPECT: Some feelings of bloating in the abdomen. Passage of more gas than usual.  Walking can help get rid of the air that was put into your GI tract during the procedure and reduce the bloating. If you had a lower endoscopy (such as a colonoscopy or flexible sigmoidoscopy) you may notice spotting of blood in your stool or on the toilet paper. If you underwent a bowel prep for your procedure, you may not have a normal bowel movement for a few days.  Please Note:  You might notice some irritation and congestion in your nose or some drainage.  This is from the oxygen used during your procedure.  There is no need for concern and it should clear up in a day or so.  SYMPTOMS TO REPORT IMMEDIATELY:  Following lower endoscopy (colonoscopy or flexible sigmoidoscopy):  Excessive amounts of blood in the stool  Significant tenderness or worsening of abdominal pains  Swelling of the abdomen that is new, acute  Fever of 100F or higher  For urgent or emergent issues, a  gastroenterologist can be reached at any hour by calling [phone]. Do not use MyChart messaging for urgent concerns.    DIET:  We do recommend a small meal at first, but then you may proceed to your regular diet.  Drink plenty of fluids but you should avoid alcoholic beverages for 24 hours.  ACTIVITY:  You should plan to take it easy for the rest of today and you should NOT DRIVE or use heavy machinery until tomorrow (because of the sedation medicines used during the test).    FOLLOW UP: Our staff will call the number listed on your records the next business day following your procedure.  We will call around 7:15- 8:00 am to check on you and address any questions or concerns that you may have regarding the information given to you following your procedure. If we do not reach you, we will leave a message.     If any biopsies were taken you will be contacted by phone or by letter within the next 1-3 weeks.  Please call us  at [phone] if you have not heard about the biopsies in 3 weeks.    SIGNATURES/CONFIDENTIALITY: You and/or your care partner have signed paperwork which will be entered into your electronic medical record.  These signatures attest to the fact that that the information above on your After Visit Summary has been reviewed and is understood.  Full responsibility of the confidentiality of this discharge information lies with you and/or your care-partner.

## 2024-01-11 NOTE — Progress Notes (Signed)
 Pt sedate, gd SR's, VSS, report to RN

## 2024-01-11 NOTE — Anesthesia Preprocedure Evaluation (Addendum)
 Anesthesia Evaluation  Patient identified by MRN, date of birth, ID band Patient awake    Reviewed: Allergy & Precautions, NPO status , Patient's Chart, lab work & pertinent test results  Airway Mallampati: III  TM Distance: >3 FB Neck ROM: Full    Dental  (+) Teeth Intact, Dental Advisory Given   Pulmonary pneumonia, Current Smoker and Patient abstained from smoking. Cigars occasionally   Pulmonary exam normal breath sounds clear to auscultation       Cardiovascular hypertension, Normal cardiovascular exam Rhythm:Regular Rate:Normal     Neuro/Psych CVA negative neurological ROS  negative psych ROS   GI/Hepatic Neg liver ROS,,,Diverticulitis    Endo/Other  diabetes, Well Controlled, Type 2, Oral Hypoglycemic Agents    Renal/GU Renal diseasenegative Renal ROS  negative genitourinary   Musculoskeletal  (+) Arthritis , Osteoarthritis,    Abdominal   Peds  Hematology negative hematology ROS (+) Hb 12.9, plt 294   Anesthesia Other Findings Ventilation: Mask ventilation without difficulty Laryngoscope Size: Glidescope and 4 Tube type: Oral Tube size: 7.5 mm Number of attempts: 1 Airway Equipment and Method: Oral airway and Rigid stylet Placement Confirmation: ETT inserted through vocal cords under direct vision, positive ETCO2 and breath sounds checked- equal and bilateral Secured at: 23 cm Tube secured with: Tape Dental Injury: Teeth and Oropharynx as per pre-operative assessment      Reproductive/Obstetrics negative OB ROS                              Anesthesia Physical Anesthesia Plan  ASA: 3  Anesthesia Plan: General   Post-op Pain Management: Tylenol  PO (pre-op)*   Induction: Intravenous  PONV Risk Score and Plan: Ondansetron , Dexamethasone , Midazolam  and Treatment may vary due to age or medical condition  Airway Management Planned: Oral ETT and Video Laryngoscope  Planned  Additional Equipment: None  Intra-op Plan:   Post-operative Plan: Extubation in OR  Informed Consent: I have reviewed the patients History and Physical, chart, labs and discussed the procedure including the risks, benefits and alternatives for the proposed anesthesia with the patient or authorized representative who has indicated his/her understanding and acceptance.     Dental advisory given  Plan Discussed with: CRNA  Anesthesia Plan Comments: (See PAT note 01/10/2024    DISCUSSION:68 y.o. 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 Lonni Pizza and Dr. Steffan Pea.    S/p exlap 02/26/23. Per notes went straight to glidescope, no anesthesia complications noted.    Right MCA stroke 05/2023. Last seen by neurology 11/06/2023. Per notes making great recovery, has mild left hand decreased dexterity and mild hand numbness. Per notes, As he is 6 months post stroke, okay to proceed with surgical procedure with small but acceptable risk of recurrent stroke while off aspirin therapy, can hold aspirin 3-5 days or as requested by surgeon and restart immediately after or once cleared by surgeon.   Per cardiology preoperative evaluation 11/14/23,  Preoperative Cardiovascular Risk Assessment: According to the Revised Cardiac Risk Index (RCRI), his Perioperative Risk of Major Cardiac Event is (%): 6.6. His Functional Capacity in METs is: 8.33 according to the Duke Activity Status Index (DASI). The patient is doing well from a cardiac perspective. Therefore, based on ACC/AHA guidelines, the patient would be at acceptable risk for the planned procedure without further cardiovascular testing.   CV: Echo 06/29/2023 1. Left  ventricular ejection fraction, by estimation, is 60 to 65%. Left  ventricular ejection fraction by 3D volume is 67 %. The left ventricle has  normal  function. The left ventricle has no regional wall motion  abnormalities. Left ventricular diastolic   parameters are consistent with Grade I diastolic dysfunction (impaired  relaxation).   2. Right ventricular systolic function is normal. The right ventricular  size is normal.   3. The mitral valve is normal in structure. No evidence of mitral valve  regurgitation. No evidence of mitral stenosis.   4. The aortic valve is tricuspid. Aortic valve regurgitation is not  visualized. No aortic stenosis is present.   5. Aortic dilatation noted. There is borderline dilatation of the aortic  root, measuring 38 mm.   6. The inferior vena cava is normal in size with greater than 50%  respiratory variability, suggesting right atrial pressure of 3 mmHg.   )        Anesthesia Quick Evaluation

## 2024-01-14 ENCOUNTER — Telehealth: Payer: Self-pay

## 2024-01-14 NOTE — Telephone Encounter (Signed)
 Left message on answering machine.

## 2024-01-16 NOTE — H&P (Signed)
 H&P   Chief Complaint:Diverticulitis   History of Present Illness: Joshua Joshua [name] w/ hx of diverticulitis here for colostomy closure with Dr. Teresa. His team has requested pre operative firefly.  Past Medical History:  Diagnosis Date   Anal fistula    Chronic kidney disease    Hidradenitis    History of bladder cancer 2009   urologist-- dr rosalind---  s/p TURBT 2010,  recurrence in office 2014 ,  none since per pt   History of colonic polyps    History of COVID-19 04/2019   per pt had covid pneumonia recovered at home, no oxygen, symptoms resolved   History of diverticulitis of colon    History of hypertension 04/05/2009   per the pt he lost 55 lbs and htn has been reversed   History of kidney stones    Hydradenitis    Hyperlipidemia 04/05/2009   pt states he does not take med for cholesterol   Hypertension    OA (osteoarthritis) 04/05/2009   Pneumonia    Psoriatic arthritis (HCC)    rheumonotologist--- dr ronal jacob   [name])    2024   Type 2 diabetes mellitus (HCC) 04/05/2009   followed by pcp---  (01-31-2021  per pt only checks blood sugar once weekly, last fasting sugar-- 122)   Past Surgical History:  Procedure Laterality Date   COLONOSCOPY     last one 11-04-2020  by gessner   EXTRACORPOREAL SHOCK WAVE LITHOTRIPSY  2015   INCISION AND DRAINAGE ABSCESS N/A 02/03/2021   Procedure: EXCISION OF PERIANAL AND PERINEAL HYDRADENITIS, INTERROGATION OF PERIANAL WOUNDS;  Surgeon: Teresa Lonni HERO, MD;  Location: [facility];  Service: General;  Laterality: N/A;   PARTIAL COLECTOMY N/A 02/26/2023   Procedure: EXPLORATORY LAPAROTOMY; PARTIAL COLECTOMY; COLOSTOMY;  Surgeon: Vernetta Berg, MD;  Location: [facility];  Service: General;  Laterality: N/A;   RECTAL EXAM UNDER ANESTHESIA  02/03/2021   Procedure: ANORECTAL EXAM UNDER ANESTHESIA;  Surgeon: Teresa Lonni HERO, MD;  Location:  SURGERY CENTER;  Service: General;;   TONSILLECTOMY     child    TRANSURETHRAL RESECTION OF BLADDER TUMOR  02/2008    Home Medications:  No medications prior to admission.   Allergies:  Allergies  Allergen Reactions   Penicillin G Hives   Penicillins Hives    And fever blisters   Adalimumab Other (See Comments)    Wasn't effective   Metformin  And Related Other (See Comments)    Upset stomach    Family History  Problem Relation Age of Onset   Diabetes Mother    Hypertension Mother    Arthritis Father    Crohn's disease Daughter    Esophageal cancer Neg Hx    Colon cancer Neg Hx    Pancreatic cancer Neg Hx    Stomach cancer Neg Hx    Colon polyps Neg Hx    Rectal cancer Neg Hx    Social History:  reports that he has been smoking cigarettes and cigars. He has never used smokeless tobacco. He reports current alcohol use of about 14.0 standard drinks of alcohol per week. He reports that he does not use drugs.  ROS: A complete review of systems was performed.  All systems are negative except for pertinent findings as noted. ROS   Physical Exam:  Vital signs in last 24 hours:   General:  Alert and oriented, No acute distress  Cardiovascular: Regular rate and rhythm Lungs: Regular rate and effort   Laboratory Data:  No results found for this or any previous visit (from the past 24 hours). No results found for this or any previous visit (from the past 240 hours). Creatinine: Recent Labs    01/10/24 1036  CREATININE 0.91    Impression/Assessment:   65 Joshua [name] w/ hx of diverticulitis here for colostomy closure with Dr. Teresa. His team has requested pre operative firefly.  We discussed risk benefits alternatives to firefly  This included bleeding infection and damage to surrounding structures surrounding structures including ureter as well as urethra.  We discussed the need for stent postoperatively as well as the potential symptoms of stent placement.  We discussed possibly requiring long-term stent versus nephrostomy tube.  We discussed  need for possible second surgery.  Patient voiced their understanding and consent was obtained.   Plan:  Proceed with firefly   [name] 01/16/2024, 5:29 PM

## 2024-01-17 ENCOUNTER — Inpatient Hospital Stay (HOSPITAL_COMMUNITY): Payer: Self-pay | Admitting: Physician Assistant

## 2024-01-17 ENCOUNTER — Encounter (HOSPITAL_COMMUNITY)
Admission: RE | Disposition: A | Discharge status: Home / Self Care | Payer: Self-pay | Source: Home / Self Care | Attending: Surgery

## 2024-01-17 ENCOUNTER — Encounter (HOSPITAL_COMMUNITY): Payer: Self-pay | Admitting: Surgery

## 2024-01-17 ENCOUNTER — Inpatient Hospital Stay (HOSPITAL_COMMUNITY): Payer: Self-pay | Admitting: Anesthesiology

## 2024-01-17 ENCOUNTER — Inpatient Hospital Stay (HOSPITAL_COMMUNITY)

## 2024-01-17 ENCOUNTER — Inpatient Hospital Stay (HOSPITAL_COMMUNITY)
Admission: RE | Admit: 2024-01-17 | Discharge: 2024-01-19 | DRG: 337 | Disposition: A | Discharge status: Home / Self Care | Attending: Surgery | Admitting: Surgery

## 2024-01-17 ENCOUNTER — Other Ambulatory Visit: Payer: Self-pay

## 2024-01-17 DIAGNOSIS — Z880 Allergy status to penicillin: Secondary | ICD-10-CM | POA: Diagnosis not present

## 2024-01-17 DIAGNOSIS — Z8551 Personal history of malignant neoplasm of bladder: Secondary | ICD-10-CM | POA: Diagnosis not present

## 2024-01-17 DIAGNOSIS — Z933 Colostomy status: Secondary | ICD-10-CM | POA: Diagnosis not present

## 2024-01-17 DIAGNOSIS — Z7984 Long term (current) use of oral hypoglycemic drugs: Secondary | ICD-10-CM

## 2024-01-17 DIAGNOSIS — Z7985 Long-term (current) use of injectable non-insulin antidiabetic drugs: Secondary | ICD-10-CM | POA: Diagnosis not present

## 2024-01-17 DIAGNOSIS — I10 Essential (primary) hypertension: Secondary | ICD-10-CM | POA: Diagnosis not present

## 2024-01-17 DIAGNOSIS — I679 Cerebrovascular disease, unspecified: Secondary | ICD-10-CM | POA: Diagnosis not present

## 2024-01-17 DIAGNOSIS — Z9049 Acquired absence of other specified parts of digestive tract: Secondary | ICD-10-CM | POA: Diagnosis not present

## 2024-01-17 DIAGNOSIS — Z860101 Personal history of adenomatous and serrated colon polyps: Secondary | ICD-10-CM | POA: Diagnosis not present

## 2024-01-17 DIAGNOSIS — Z79899 Other long term (current) drug therapy: Secondary | ICD-10-CM

## 2024-01-17 DIAGNOSIS — Z433 Encounter for attention to colostomy: Principal | ICD-10-CM

## 2024-01-17 DIAGNOSIS — Z8673 Personal history of transient ischemic attack (TIA), and cerebral infarction without residual deficits: Secondary | ICD-10-CM | POA: Diagnosis not present

## 2024-01-17 DIAGNOSIS — F17210 Nicotine dependence, cigarettes, uncomplicated: Secondary | ICD-10-CM | POA: Diagnosis present

## 2024-01-17 DIAGNOSIS — Z8379 Family history of other diseases of the digestive system: Secondary | ICD-10-CM

## 2024-01-17 DIAGNOSIS — E785 Hyperlipidemia, unspecified: Secondary | ICD-10-CM | POA: Diagnosis present

## 2024-01-17 DIAGNOSIS — Z7982 Long term (current) use of aspirin: Secondary | ICD-10-CM

## 2024-01-17 DIAGNOSIS — Z833 Family history of diabetes mellitus: Secondary | ICD-10-CM

## 2024-01-17 DIAGNOSIS — L4050 Arthropathic psoriasis, unspecified: Secondary | ICD-10-CM | POA: Diagnosis not present

## 2024-01-17 DIAGNOSIS — Z8261 Family history of arthritis: Secondary | ICD-10-CM

## 2024-01-17 DIAGNOSIS — K565 Intestinal adhesions [bands], unspecified as to partial versus complete obstruction: Secondary | ICD-10-CM | POA: Diagnosis not present

## 2024-01-17 DIAGNOSIS — E1122 Type 2 diabetes mellitus with diabetic chronic kidney disease: Secondary | ICD-10-CM | POA: Diagnosis present

## 2024-01-17 DIAGNOSIS — K5732 Diverticulitis of large intestine without perforation or abscess without bleeding: Secondary | ICD-10-CM

## 2024-01-17 DIAGNOSIS — Z8616 Personal history of COVID-19: Secondary | ICD-10-CM

## 2024-01-17 DIAGNOSIS — Z79620 Long term (current) use of immunosuppressive biologic: Secondary | ICD-10-CM | POA: Diagnosis not present

## 2024-01-17 DIAGNOSIS — E1165 Type 2 diabetes mellitus with hyperglycemia: Secondary | ICD-10-CM

## 2024-01-17 DIAGNOSIS — K660 Peritoneal adhesions (postprocedural) (postinfection): Secondary | ICD-10-CM | POA: Diagnosis not present

## 2024-01-17 DIAGNOSIS — Z8249 Family history of ischemic heart disease and other diseases of the circulatory system: Secondary | ICD-10-CM | POA: Diagnosis not present

## 2024-01-17 DIAGNOSIS — F17290 Nicotine dependence, other tobacco product, uncomplicated: Secondary | ICD-10-CM | POA: Diagnosis present

## 2024-01-17 DIAGNOSIS — Z9889 Other specified postprocedural states: Principal | ICD-10-CM

## 2024-01-17 DIAGNOSIS — Z888 Allergy status to other drugs, medicaments and biological substances status: Secondary | ICD-10-CM | POA: Diagnosis not present

## 2024-01-17 DIAGNOSIS — K5792 Diverticulitis of intestine, part unspecified, without perforation or abscess without bleeding: Secondary | ICD-10-CM | POA: Diagnosis not present

## 2024-01-17 DIAGNOSIS — C679 Malignant neoplasm of bladder, unspecified: Secondary | ICD-10-CM | POA: Diagnosis not present

## 2024-01-17 HISTORY — PX: FLEXIBLE SIGMOIDOSCOPY: SHX5431

## 2024-01-17 HISTORY — PX: XI ROBOTIC ASSISTED COLOSTOMY TAKEDOWN: SHX6828

## 2024-01-17 HISTORY — PX: CYSTOSCOPY WITH INDOCYANINE GREEN IMAGING (ICG): SHX7549

## 2024-01-17 HISTORY — PX: LAPAROSCOPIC LYSIS OF ADHESIONS: SHX5905

## 2024-01-17 LAB — CREATININE, SERUM
Creatinine, Ser: 1.03 mg/dL (ref 0.61–1.24)
GFR, Estimated: >60 mL/min (ref >60)

## 2024-01-17 LAB — SURGICAL PATHOLOGY

## 2024-01-17 LAB — CBC
HCT: 51 % (ref 39.0–52.0)
Hemoglobin: 16.8 g/dL (ref 13.0–17.0)
MCH: 31.3 pg (ref 26.0–34.0)
MCHC: 32.9 g/dL (ref 30.0–36.0)
MCV: 95.1 fL (ref 80.0–100.0)
Platelets: 210 K/uL (ref 150–400)
RBC: 5.36 MIL/uL (ref 4.22–5.81)
RDW: 13.2 % (ref 11.5–15.5)
WBC: 9.9 K/uL (ref 4.0–10.5)
nRBC: 0 % (ref 0.0–0.2)

## 2024-01-17 LAB — TYPE AND SCREEN
ABO/RH(D): A POS
Antibody Screen: NEGATIVE

## 2024-01-17 LAB — GLUCOSE, CAPILLARY
Glucose-Capillary: 177 mg/dL — ABNORMAL HIGH (ref 70–99)
Glucose-Capillary: 162 mg/dL — ABNORMAL HIGH (ref 70–99)

## 2024-01-17 LAB — ABO/RH: ABO/RH(D): A POS

## 2024-01-17 SURGERY — CLOSURE, COLOSTOMY, ROBOT-ASSISTED
Anesthesia: General

## 2024-01-17 MED ORDER — ENSURE PRE-SURGERY PO LIQD: 296.0000 mL | Freq: Once | ORAL | Status: DC

## 2024-01-17 MED ORDER — SUGAMMADEX SODIUM 200 MG/2ML IV SOLN
INTRAVENOUS | Status: DC | PRN
  Administered 2024-01-17: 400 mg via INTRAVENOUS

## 2024-01-17 MED ORDER — HEPARIN SODIUM (PORCINE) 5000 UNIT/ML IJ SOLN
5000.0000 [IU] | Freq: Once | INTRAMUSCULAR | Status: AC
  Administered 2024-01-17: 5000 [IU] via SUBCUTANEOUS
  Filled 2024-01-17: qty 1

## 2024-01-17 MED ORDER — ROCURONIUM BROMIDE 10 MG/ML (PF) SYRINGE
PREFILLED_SYRINGE | INTRAVENOUS | Status: AC
  Filled 2024-01-17: qty 10

## 2024-01-17 MED ORDER — ONDANSETRON HCL 4 MG/2ML IJ SOLN
INTRAMUSCULAR | Status: DC | PRN
  Administered 2024-01-17: 4 mg via INTRAVENOUS

## 2024-01-17 MED ORDER — KETAMINE HCL 10 MG/ML IJ SOLN
INTRAMUSCULAR | Status: DC | PRN
Start: 2024-01-17 — End: 2024-01-17
  Administered 2024-01-17: 10 mg via INTRAVENOUS
  Administered 2024-01-17: 30 mg via INTRAVENOUS
  Administered 2024-01-17: 10 mg via INTRAVENOUS

## 2024-01-17 MED ORDER — SUGAMMADEX SODIUM 200 MG/2ML IV SOLN
INTRAVENOUS | Status: AC
  Filled 2024-01-17: qty 2

## 2024-01-17 MED ORDER — ONDANSETRON HCL 4 MG/2ML IJ SOLN: 4.0000 mg | Freq: Once | INTRAMUSCULAR | Status: DC | PRN

## 2024-01-17 MED ORDER — ROCURONIUM 10MG/ML (10ML) SYRINGE FOR MEDFUSION PUMP - OPTIME
INTRAVENOUS | Status: DC | PRN
  Administered 2024-01-17: 100 mg via INTRAVENOUS
  Administered 2024-01-17: 50 mg via INTRAVENOUS

## 2024-01-17 MED ORDER — ACETAMINOPHEN 500 MG PO TABS
1000.0000 mg | ORAL_TABLET | Freq: Four times a day (QID) | ORAL | Status: DC
  Administered 2024-01-17 – 2024-01-18 (×5): 1000 mg via ORAL
  Filled 2024-01-17 (×6): qty 2

## 2024-01-17 MED ORDER — SODIUM CHLORIDE 0.9 % IR SOLN
Status: DC | PRN
  Administered 2024-01-17: 1000 mL

## 2024-01-17 MED ORDER — OXYCODONE HCL 5 MG/5ML PO SOLN: 5.0000 mg | Freq: Once | ORAL | Status: DC | PRN

## 2024-01-17 MED ORDER — HYDROMORPHONE HCL 1 MG/ML IJ SOLN: 0.5000 mg | INTRAMUSCULAR | Status: DC | PRN

## 2024-01-17 MED ORDER — ONDANSETRON HCL 4 MG PO TABS: 4.0000 mg | ORAL_TABLET | Freq: Four times a day (QID) | ORAL | Status: DC | PRN

## 2024-01-17 MED ORDER — ALVIMOPAN 12 MG PO CAPS
12.0000 mg | ORAL_CAPSULE | ORAL | Status: AC
  Administered 2024-01-17: 12 mg via ORAL
  Filled 2024-01-17: qty 1

## 2024-01-17 MED ORDER — LACTATED RINGERS IR SOLN
Status: DC | PRN
Start: 2024-01-17 — End: 2024-01-17
  Administered 2024-01-17: 1000 mL

## 2024-01-17 MED ORDER — INSULIN ASPART 100 UNIT/ML IJ SOLN
0.0000 [IU] | INTRAMUSCULAR | Status: DC | PRN
  Administered 2024-01-17: 2 [IU] via SUBCUTANEOUS

## 2024-01-17 MED ORDER — ENSURE SURGERY PO LIQD
237.0000 mL | Freq: Two times a day (BID) | ORAL | Status: DC
  Administered 2024-01-18 – 2024-01-19 (×3): 237 mL via ORAL

## 2024-01-17 MED ORDER — MIDAZOLAM HCL 2 MG/2ML IJ SOLN
INTRAMUSCULAR | Status: AC
  Filled 2024-01-17: qty 2

## 2024-01-17 MED ORDER — KETOROLAC TROMETHAMINE 30 MG/ML IJ SOLN
INTRAMUSCULAR | Status: AC
  Filled 2024-01-17: qty 1

## 2024-01-17 MED ORDER — LIDOCAINE 2% (20 MG/ML) 5 ML SYRINGE
INTRAMUSCULAR | Status: DC | PRN
  Administered 2024-01-17: 100 mg via INTRAVENOUS

## 2024-01-17 MED ORDER — ENSURE PRE-SURGERY PO LIQD: 592.0000 mL | Freq: Once | ORAL | Status: DC

## 2024-01-17 MED ORDER — 0.9 % SODIUM CHLORIDE (POUR BTL) OPTIME
TOPICAL | Status: DC | PRN
  Administered 2024-01-17: 1000 mL

## 2024-01-17 MED ORDER — LACTATED RINGERS IV SOLN: INTRAVENOUS | Status: DC

## 2024-01-17 MED ORDER — HYDRALAZINE HCL 20 MG/ML IJ SOLN: 10.0000 mg | INTRAMUSCULAR | Status: DC | PRN

## 2024-01-17 MED ORDER — EMPAGLIFLOZIN 10 MG PO TABS
10.0000 mg | ORAL_TABLET | Freq: Every day | ORAL | Status: DC
  Administered 2024-01-18 – 2024-01-19 (×2): 10 mg via ORAL
  Filled 2024-01-17 (×2): qty 1

## 2024-01-17 MED ORDER — BISACODYL 5 MG PO TBEC: 20.0000 mg | DELAYED_RELEASE_TABLET | Freq: Once | ORAL | Status: DC

## 2024-01-17 MED ORDER — EPHEDRINE SULFATE (PRESSORS) 50 MG/ML IJ SOLN
INTRAMUSCULAR | Status: DC | PRN
  Administered 2024-01-17: 10 mg via INTRAVENOUS
  Administered 2024-01-17: 5 mg via INTRAVENOUS
  Administered 2024-01-17: 10 mg via INTRAVENOUS

## 2024-01-17 MED ORDER — POLYETHYLENE GLYCOL 3350 17 GM/SCOOP PO POWD: 238.0000 g | Freq: Once | ORAL | Status: DC

## 2024-01-17 MED ORDER — ORAL CARE MOUTH RINSE: 15.0000 mL | Freq: Once | OROMUCOSAL | Status: AC

## 2024-01-17 MED ORDER — ROSUVASTATIN CALCIUM 10 MG PO TABS
10.0000 mg | ORAL_TABLET | Freq: Every day | ORAL | Status: DC
  Administered 2024-01-18 – 2024-01-19 (×2): 10 mg via ORAL
  Filled 2024-01-17 (×2): qty 1

## 2024-01-17 MED ORDER — HYDROMORPHONE HCL 1 MG/ML IJ SOLN
INTRAMUSCULAR | Status: DC | PRN
  Administered 2024-01-17: 1 mg via INTRAVENOUS

## 2024-01-17 MED ORDER — CHLORHEXIDINE GLUCONATE CLOTH 2 % EX PADS: 6.0000 | MEDICATED_PAD | Freq: Once | CUTANEOUS | Status: DC

## 2024-01-17 MED ORDER — FENTANYL CITRATE (PF) 250 MCG/5ML IJ SOLN
INTRAMUSCULAR | Status: DC | PRN
  Administered 2024-01-17: 100 ug via INTRAVENOUS
  Administered 2024-01-17: 50 ug via INTRAVENOUS
  Administered 2024-01-17: 100 ug via INTRAVENOUS

## 2024-01-17 MED ORDER — PHENYLEPHRINE HCL-NACL 20-0.9 MG/250ML-% IV SOLN
INTRAVENOUS | Status: DC | PRN
  Administered 2024-01-17: 50 ug/min via INTRAVENOUS

## 2024-01-17 MED ORDER — PHENYLEPHRINE HCL-NACL 20-0.9 MG/250ML-% IV SOLN
INTRAVENOUS | Status: AC
  Filled 2024-01-17: qty 250

## 2024-01-17 MED ORDER — ONDANSETRON HCL 4 MG/2ML IJ SOLN: 4.0000 mg | Freq: Four times a day (QID) | INTRAMUSCULAR | Status: DC | PRN

## 2024-01-17 MED ORDER — SPY AGENT GREEN - (INDOCYANINE FOR INJECTION)
INTRAMUSCULAR | Status: DC | PRN
  Administered 2024-01-17: 4.5 mL
  Administered 2024-01-17: 5 mL

## 2024-01-17 MED ORDER — STERILE WATER FOR INJECTION IJ SOLN
INTRAMUSCULAR | Status: AC
  Filled 2024-01-17: qty 10

## 2024-01-17 MED ORDER — MIDAZOLAM HCL 2 MG/2ML IJ SOLN
1.0000 mg | INTRAMUSCULAR | Status: DC
  Administered 2024-01-17: 2 mg via INTRAVENOUS

## 2024-01-17 MED ORDER — STERILE WATER FOR IRRIGATION IR SOLN
Status: DC | PRN
  Administered 2024-01-17: 1000 mL

## 2024-01-17 MED ORDER — FENTANYL CITRATE (PF) 250 MCG/5ML IJ SOLN
INTRAMUSCULAR | Status: AC
  Filled 2024-01-17: qty 5

## 2024-01-17 MED ORDER — BUPIVACAINE-EPINEPHRINE (PF) 0.25% -1:200000 IJ SOLN
INTRAMUSCULAR | Status: DC | PRN
  Administered 2024-01-17: 50 mL

## 2024-01-17 MED ORDER — OXYCODONE HCL 5 MG PO TABS: 5.0000 mg | ORAL_TABLET | Freq: Once | ORAL | Status: DC | PRN

## 2024-01-17 MED ORDER — TRAMADOL HCL 50 MG PO TABS
50.0000 mg | ORAL_TABLET | Freq: Four times a day (QID) | ORAL | Status: DC | PRN
  Administered 2024-01-18 – 2024-01-19 (×3): 50 mg via ORAL
  Filled 2024-01-17 (×3): qty 1

## 2024-01-17 MED ORDER — FENTANYL CITRATE PF 50 MCG/ML IJ SOSY: 25.0000 ug | PREFILLED_SYRINGE | INTRAMUSCULAR | Status: DC | PRN

## 2024-01-17 MED ORDER — MEPERIDINE HCL 100 MG/ML IJ SOLN: 6.2500 mg | INTRAMUSCULAR | Status: DC | PRN

## 2024-01-17 MED ORDER — MIDAZOLAM HCL 2 MG/2ML IJ SOLN
INTRAMUSCULAR | Status: DC | PRN
  Administered 2024-01-17: 2 mg via INTRAVENOUS

## 2024-01-17 MED ORDER — TRIAMCINOLONE ACETONIDE 0.1 % EX CREA: 1.0000 | TOPICAL_CREAM | Freq: Two times a day (BID) | CUTANEOUS | Status: DC | PRN

## 2024-01-17 MED ORDER — CLINDAMYCIN PHOSPHATE 1 % EX LOTN: 1.0000 | TOPICAL_LOTION | Freq: Every day | CUTANEOUS | Status: DC | PRN

## 2024-01-17 MED ORDER — DIPHENHYDRAMINE HCL 12.5 MG/5ML PO ELIX
12.5000 mg | ORAL_SOLUTION | Freq: Four times a day (QID) | ORAL | Status: DC | PRN
  Administered 2024-01-19: 12.5 mg via ORAL
  Filled 2024-01-17: qty 5

## 2024-01-17 MED ORDER — ACETAMINOPHEN 500 MG PO TABS
1000.0000 mg | ORAL_TABLET | ORAL | Status: AC
  Administered 2024-01-17: 1000 mg via ORAL
  Filled 2024-01-17: qty 2

## 2024-01-17 MED ORDER — IBUPROFEN 200 MG PO TABS
600.0000 mg | ORAL_TABLET | Freq: Four times a day (QID) | ORAL | Status: DC | PRN
Start: 2024-01-17 — End: 2024-01-19

## 2024-01-17 MED ORDER — BUPIVACAINE-EPINEPHRINE (PF) 0.25% -1:200000 IJ SOLN
INTRAMUSCULAR | Status: AC
  Filled 2024-01-17: qty 60

## 2024-01-17 MED ORDER — LIDOCAINE HCL (PF) 2 % IJ SOLN
INTRAMUSCULAR | Status: AC
  Filled 2024-01-17: qty 5

## 2024-01-17 MED ORDER — ALVIMOPAN 12 MG PO CAPS: 12.0000 mg | ORAL_CAPSULE | Freq: Two times a day (BID) | ORAL | Status: DC

## 2024-01-17 MED ORDER — SIMETHICONE 80 MG PO CHEW: 40.0000 mg | CHEWABLE_TABLET | Freq: Four times a day (QID) | ORAL | Status: DC | PRN

## 2024-01-17 MED ORDER — DIPHENHYDRAMINE HCL 50 MG/ML IJ SOLN
12.5000 mg | Freq: Four times a day (QID) | INTRAMUSCULAR | Status: DC | PRN
  Administered 2024-01-17: 12.5 mg via INTRAVENOUS
  Filled 2024-01-17: qty 1

## 2024-01-17 MED ORDER — METRONIDAZOLE 500 MG PO TABS: 1000.0000 mg | ORAL_TABLET | ORAL | Status: DC

## 2024-01-17 MED ORDER — HEPARIN SODIUM (PORCINE) 5000 UNIT/ML IJ SOLN
5000.0000 [IU] | Freq: Three times a day (TID) | INTRAMUSCULAR | Status: DC
  Administered 2024-01-17 – 2024-01-19 (×5): 5000 [IU] via SUBCUTANEOUS
  Filled 2024-01-17 (×5): qty 1

## 2024-01-17 MED ORDER — PROPOFOL 10 MG/ML IV BOLUS
INTRAVENOUS | Status: DC | PRN
Start: 2024-01-17 — End: 2024-01-17
  Administered 2024-01-17: 200 mg via INTRAVENOUS

## 2024-01-17 MED ORDER — CHLORHEXIDINE GLUCONATE 0.12 % MT SOLN
15.0000 mL | Freq: Once | OROMUCOSAL | Status: AC
  Administered 2024-01-17: 15 mL via OROMUCOSAL

## 2024-01-17 MED ORDER — BETAMETHASONE DIPROPIONATE 0.05 % EX LOTN: 1.0000 | TOPICAL_LOTION | Freq: Every day | CUTANEOUS | Status: DC | PRN

## 2024-01-17 MED ORDER — PROPOFOL 10 MG/ML IV BOLUS
INTRAVENOUS | Status: AC
  Filled 2024-01-17: qty 20

## 2024-01-17 MED ORDER — DEXAMETHASONE SODIUM PHOSPHATE 10 MG/ML IJ SOLN
INTRAMUSCULAR | Status: DC | PRN
  Administered 2024-01-17: 10 mg via INTRAVENOUS

## 2024-01-17 MED ORDER — SODIUM CHLORIDE 0.9 % IV SOLN
2.0000 g | INTRAVENOUS | Status: AC
  Administered 2024-01-17: 2 g via INTRAVENOUS
  Filled 2024-01-17: qty 2

## 2024-01-17 MED ORDER — NEOMYCIN SULFATE 500 MG PO TABS: 1000.0000 mg | ORAL_TABLET | ORAL | Status: DC

## 2024-01-17 MED ORDER — PHENYLEPHRINE HCL (PRESSORS) 10 MG/ML IV SOLN
INTRAVENOUS | Status: DC | PRN
  Administered 2024-01-17: 200 ug via INTRAVENOUS

## 2024-01-17 MED ORDER — ALUM & MAG HYDROXIDE-SIMETH 200-200-20 MG/5ML PO SUSP: 30.0000 mL | Freq: Four times a day (QID) | ORAL | Status: DC | PRN

## 2024-01-17 MED ORDER — IOHEXOL 300 MG/ML  SOLN
INTRAMUSCULAR | Status: DC | PRN
  Administered 2024-01-17: 4.5 mL
  Administered 2024-01-17: 5 mL

## 2024-01-17 SURGICAL SUPPLY — 94 items
BAG COUNTER SPONGE SURGICOUNT (BAG) IMPLANT
BAG URO CATCHER STRL LF (MISCELLANEOUS) ×1 IMPLANT
BLADE EXTENDED COATED 6.5IN (ELECTRODE) ×1 IMPLANT
CANNULA REDUCER 12-8 DVNC XI (CANNULA) ×1 IMPLANT
CATH URETL OPEN 5X70 (CATHETERS) ×1 IMPLANT
CELLS DAT CNTRL 66122 CELL SVR (MISCELLANEOUS) IMPLANT
CHLORAPREP W/TINT 26 (MISCELLANEOUS) ×1 IMPLANT
CLIP APPLIE 5 13 M/L LIGAMAX5 (MISCELLANEOUS) IMPLANT
CLIP APPLIE ROT 10 11.4 M/L (STAPLE) IMPLANT
CLIP LIGATING HEM O LOK PURPLE (MISCELLANEOUS) IMPLANT
CLIP LIGATING HEMO O LOK GREEN (MISCELLANEOUS) IMPLANT
CLOTH BEACON ORANGE TIMEOUT ST (SAFETY) ×1 IMPLANT
COVER SURGICAL LIGHT HANDLE (MISCELLANEOUS) ×2 IMPLANT
COVER TIP SHEARS 8 DVNC (MISCELLANEOUS) ×1 IMPLANT
DEFOGGER SCOPE WARM SEASHARP (MISCELLANEOUS) ×2 IMPLANT
DEVICE TROCAR PUNCTURE CLOSURE (ENDOMECHANICALS) IMPLANT
DRAIN CHANNEL 19F RND (DRAIN) ×1 IMPLANT
DRAPE ARM DVNC X/XI (DISPOSABLE) ×4 IMPLANT
DRAPE COLUMN DVNC XI (DISPOSABLE) ×1 IMPLANT
DRAPE CV SPLIT W-CLR ANES SCRN (DRAPES) ×1 IMPLANT
DRAPE PERI GROIN 82X75IN TIB (DRAPES) ×1 IMPLANT
DRAPE SURG IRRIG POUCH 19X23 (DRAPES) ×1 IMPLANT
DRIVER NDL LRG 8 DVNC XI (INSTRUMENTS) ×1 IMPLANT
DRIVER NDLE LRG 8 DVNC XI (INSTRUMENTS) ×1 IMPLANT
DRSG OPSITE POSTOP 4X10 (GAUZE/BANDAGES/DRESSINGS) IMPLANT
DRSG OPSITE POSTOP 4X6 (GAUZE/BANDAGES/DRESSINGS) IMPLANT
DRSG OPSITE POSTOP 4X8 (GAUZE/BANDAGES/DRESSINGS) IMPLANT
DRSG TEGADERM 2-3/8X2-3/4 SM (GAUZE/BANDAGES/DRESSINGS) ×5 IMPLANT
DRSG TEGADERM 4X4.75 (GAUZE/BANDAGES/DRESSINGS) ×1 IMPLANT
ELECT REM PT RETURN 15FT ADLT (MISCELLANEOUS) ×1 IMPLANT
ENDOLOOP SUT PDS II 0 18 (SUTURE) IMPLANT
EVACUATOR SILICONE 100CC (DRAIN) ×1 IMPLANT
FORCEPS BPLR FENES DVNC XI (FORCEP) IMPLANT
GAUZE SPONGE 2X2 8PLY STRL LF (GAUZE/BANDAGES/DRESSINGS) ×1 IMPLANT
GAUZE SPONGE 4X4 12PLY STRL (GAUZE/BANDAGES/DRESSINGS) IMPLANT
GLOVE BIO SURGEON STRL SZ7.5 (GLOVE) ×3 IMPLANT
GLOVE BIO SURGEON STRL SZ8 (GLOVE) ×1 IMPLANT
GLOVE INDICATOR 8.0 STRL GRN (GLOVE) ×3 IMPLANT
GOWN SRG XL LVL 4 BRTHBL STRL (GOWNS) ×1 IMPLANT
GOWN STRL REUS W/ TWL XL LVL3 (GOWN DISPOSABLE) ×6 IMPLANT
GRASPER SUT TROCAR 14GX15 (MISCELLANEOUS) IMPLANT
GRASPER TIP-UP FEN DVNC XI (INSTRUMENTS) ×1 IMPLANT
GUIDEWIRE STR DUAL SENSOR (WIRE) ×1 IMPLANT
HOLDER FOLEY CATH W/STRAP (MISCELLANEOUS) ×1 IMPLANT
IRRIGATION SUCT STRKRFLW 2 WTP (MISCELLANEOUS) ×1 IMPLANT
KIT PROCEDURE DVNC SI (MISCELLANEOUS) IMPLANT
KIT TURNOVER KIT A (KITS) ×2 IMPLANT
MANIFOLD NEPTUNE II (INSTRUMENTS) ×1 IMPLANT
NDL INSUFFLATION 14GA 120MM (NEEDLE) ×1 IMPLANT
NEEDLE INSUFFLATION 14GA 120MM (NEEDLE) ×1 IMPLANT
PACK COLON (CUSTOM PROCEDURE TRAY) ×1 IMPLANT
PACK CYSTO (CUSTOM PROCEDURE TRAY) ×1 IMPLANT
PAD POSITIONING PINK XL (MISCELLANEOUS) ×1 IMPLANT
PENCIL SMOKE EVACUATOR (MISCELLANEOUS) IMPLANT
PROTECTOR NERVE ULNAR (MISCELLANEOUS) ×2 IMPLANT
RELOAD STAPLE 45 3.5 BLU DVNC (STAPLE) IMPLANT
RELOAD STAPLE 45 4.3 GRN DVNC (STAPLE) IMPLANT
RELOAD STAPLE 60 3.5 BLU DVNC (STAPLE) IMPLANT
RELOAD STAPLE 60 4.3 GRN DVNC (STAPLE) IMPLANT
RETRACTOR WND ALEXIS 18 MED (MISCELLANEOUS) IMPLANT
SCISSORS LAP 5X35 DISP (ENDOMECHANICALS) IMPLANT
SCISSORS MNPLR CVD DVNC XI (INSTRUMENTS) ×1 IMPLANT
SEAL UNIV 5-12 XI (MISCELLANEOUS) ×4 IMPLANT
SEALER VESSEL EXT DVNC XI (MISCELLANEOUS) ×1 IMPLANT
SLEEVE ADV FIXATION 5X100MM (TROCAR) IMPLANT
SOLUTION ELECTROSURG ANTI STCK (MISCELLANEOUS) ×1 IMPLANT
SPIKE FLUID TRANSFER (MISCELLANEOUS) ×1 IMPLANT
STAPLER 60 SUREFORM DVNC (STAPLE) IMPLANT
STAPLER ECHELON POWER CIR 29 (STAPLE) IMPLANT
STAPLER ECHELON POWER CIR 31 (STAPLE) IMPLANT
STOPCOCK 4 WAY LG BORE MALE ST (IV SETS) ×2 IMPLANT
SURGILUBE 2OZ TUBE FLIPTOP (MISCELLANEOUS) ×1 IMPLANT
SUT MNCRL AB 4-0 PS2 18 (SUTURE) ×1 IMPLANT
SUT PDS AB 1 CT1 27 (SUTURE) IMPLANT
SUT PDS AB 1 TP1 96 (SUTURE) IMPLANT
SUT PROLENE 0 CT 2 (SUTURE) IMPLANT
SUT PROLENE 2 0 KS (SUTURE) ×1 IMPLANT
SUT PROLENE 2 0 SH DA (SUTURE) IMPLANT
SUT SILK 2 0 SH CR/8 (SUTURE) IMPLANT
SUT SILK 2-0 18XBRD TIE 12 (SUTURE) IMPLANT
SUT SILK 3 0 SH CR/8 (SUTURE) ×1 IMPLANT
SUT SILK 3-0 18XBRD TIE 12 (SUTURE) ×1 IMPLANT
SUT VIC AB 3-0 SH 18 (SUTURE) IMPLANT
SUT VIC AB 3-0 SH 27XBRD (SUTURE) IMPLANT
SUT VICRYL 0 UR6 27IN ABS (SUTURE) ×1 IMPLANT
SUTURE V-LC BRB 180 2/0GR6GS22 (SUTURE) IMPLANT
SYR 10ML LL (SYRINGE) ×1 IMPLANT
SYSTEM LAPSCP GELPORT 120MM (MISCELLANEOUS) IMPLANT
SYSTEM WOUND ALEXIS 18CM MED (MISCELLANEOUS) ×1 IMPLANT
TAPE UMBILICAL 1/8 X36 TWILL (MISCELLANEOUS) ×1 IMPLANT
TRAY FOLEY MTR SLVR 16FR STAT (SET/KITS/TRAYS/PACK) ×1 IMPLANT
TROCAR ADV FIXATION 5X100MM (TROCAR) ×1 IMPLANT
TUBING CONNECTING 10 (TUBING) ×3 IMPLANT
TUBING INSUFFLATION 10FT LAP (TUBING) ×1 IMPLANT

## 2024-01-17 NOTE — Transfer of Care (Signed)
 Immediate Anesthesia Transfer of Care Note  Patient: Joshua [name]  Procedure(s) Performed: CLOSURE, COLOSTOMY, ROBOT-ASSISTED LYSIS, ADHESIONS, LAPAROSCOPIC SIGMOIDOSCOPY, FLEXIBLE CYSTOSCOPY WITH INDOCYANINE GREEN  IMAGING (ICG)  Patient Location: PACU  Anesthesia Type:General  Level of Consciousness: sedated  Airway & Oxygen Therapy: Patient Spontanous Breathing and Patient connected to face mask oxygen  Post-op Assessment: Report given to RN and Post -op Vital signs reviewed and stable  Post vital signs: Reviewed and stable  Last Vitals:  Vitals Value Taken Time  BP 129/65 01/17/24 15:26  Temp    Pulse 86 01/17/24 15:28  Resp 13 01/17/24 15:28  SpO2 100 % 01/17/24 15:28  Vitals shown include unfiled device data.  Last Pain:  Vitals:   01/17/24 1051  TempSrc:   PainSc: 0-No pain         Complications: No notable events documented.

## 2024-01-17 NOTE — H&P (Signed)
 CC: Here today for surgery  HPI: Joshua [name] is an 68 y.o. male with history of psoriatic arthritis (on Enbrel), HTN, HLD, DM, bladder ca (2010), whom is seen today in the office at the request of Dr. Arch for evaluation of possible anal fistula.   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  He reports he had an approximate 2 to 3-year history of perianal pain that is intermittent followed by drainage. This has been an ongoing issue. This all began around the time when COVID began. He does report a personal history of diverticulitis with multiple prior attacks of left lower quadrant pain but is not interested in having any sort of surgery for that at this time. He has a daughter who has been diagnosed with Crohn's disease and she is on Humira. He himself has a history of psoriatic arthritis and is on Enbrel. If he comes off this for more than 3 weeks, he begins to have severe joint pain.  OR 02/03/21 -excision of hidradenitis of both perianal and perineal skin-2 x 8 cm. Interrogation of perianal wound.  FINDINGS: Left anterior perianal wound with 2 separate openings that look more Swiss cheese in appearance and more likely hidradenitis. Neither are very close to the anal opening. These were interrogated by injecting dilute methylene blue. There was communication with multiple sinuses on his perineum extending in a line from his scrotum. Clinically this all appears most consistent with hidradenitis. There is no evident communication with the anal canal. The perianal portion was opened and hypergranulation tissue excised. The perineal portion was unroofed and all chronic granulation tissue removed. Wounds are packed with a single moist 4 x 4 gauze.  PATH: Benign skin with erosion, chronic inflammation, and focal giant cells. No dysplasia or malignancy. Comment: There are focal giant cells  but well-formed granulomas are not seen.  He has had intermittent drainage from the left buttock area. He has a chronic wound in that location now as well. He denies any recurring abscess like symptoms like he had had prior to his surgery with us . He is currently on Enbrel for his psoriatic arthritis. He has a known history of hidradenitis including in other locations on his body. This would go along with what we found in the perianal area as well.  Occasionally, he will apply topical clindamycin  gel and notes some degree of improvement.  Colonoscopy with Dr. Avram 11/04/20 -2 small polyps removed, post polypectomy scar in the cecum seen. - Stricture in distal sigmoid. Pediatric colonoscope required to traverse. - Diverticulosis in sigmoid - Hemorrhoids  CT A/P 01/19/23 1. Inflammatory changes surrounding the mid sigmoid colon compatible with acute diverticulitis. No complicating features are present. 2. Moderate central canal stenosis at L4-5 secondary to facet hypertrophy. 3. Severe central canal stenosis at L3-4 secondary to a prominent posterior calcified disc. 4. Coronary artery disease. 5. Enlarged prostate gland. 6. Fused anterior osteophytes extend from the thoracic spine to the L3 level compatible with diffuse idiopathic skeletal hyperostosis. 7. Aortic Atherosclerosis (ICD10-I70.0).  He was referred to see us  back for evaluation of his diverticular stricture and consideration of sigmoid colectomy.  He reports that he has had an unintentional significant weight loss over the last 2 months of approximately 60 pounds. He has had nausea and left-sided abdominal pain. This has progressed in this interval. He is having bouts of severe gas pains and cramps followed by  loose watery stool and mucus. This will occur multiple times per day. He does report relief at night where he can sleep. He currently reports fairly significant left lower quadrant pain. He denies any pain in the right side  of his abdomen or in his upper abdomen. He is having regular bowel movements at present and passing flatus.  OR 02/26/23 with my partner Dr. Vernetta - Exlap/Hartmann's  Findings: The patient was found to have a significant area of sigmoid diverticulitis with a phlegmon and small bowel adhered to the area. The area of diverticulitis was at least 10 cm in length   PATH A. COLON, SIGMOID, RESECTION:  - Colon with diverticulitis and abscess formation in the background of  extensive lamina propria edema and prominent serosal adhesions.  - Margins viable.  - 2 benign lymph nodes.    GGE 05/31/23 - Normal appearing short blind ending pouch  Office visit -- presents for follow-up after a stroke in December.  In December, he experienced a stroke, initially presenting with confusion and peripheral vision issues, particularly noticeable while driving. He also had difficulty with spatial awareness and coordination, especially at night. These symptoms were initially attributed to a cortisone shot received in his shoulder two weeks prior, but an MRI 05/26/23 confirmed the stroke. He has made a significant recovery, regaining full sensation and resuming driving. He continues with physical and occupational therapy. His left eye, initially slow to coordinate with the right, has improved with therapy. He is on aspirin and Crestor  for stroke prophylaxis and has not experienced atrial fibrillation or irregular heartbeats. An echocardiogram and carotid ultrasound were normal, with no source for the stroke identified.  He was scheduled for a colonoscopy, which was postponed due to the stroke. He is awaiting neurologist clearance (Dr. Modena Callander) before proceeding with the colonoscopy and potential surgery.   INTERVAL HX Returns for. He reports he has been doing great. He has no complaints today. He is undergoing loop recorder evaluation with cardiology. Back to all of his activities. He has maintained an active  lifestyle.  CTA Head/Neck 07/2023 *No evidence of carotid or vertebral artery stenosis, dissection or occlusion. *Dominant left vertebral artery. *Multilevel degenerative disc disease of the cervical spine with large anterior syndesmophytes osteophytes of the cervical spine findings that could correlate with DISH.  Colonoscopy with Dr. Avram 01/11/24 - Patent end sigmoid colostomy. - Two diminutive polyps in the transverse colon and in the cecum, removed with a cold snare. Resected and retrieved. - Diverticulosis in the sigmoid colon, in the descending colon and in the transverse colon. - The examination was otherwise normal. Examined rectal pouch but limited views due to retained mucous - Personal history of colonic polyps. 03/2014 - 7 mm ssp - 05/04/20 30 mm cecal adenoma June 2022-diminutive SSP no residual adenoma  He denies any changes in health or health history since we met in the office. No new medications/allergies. He states he is ready for surgery today. Cleared by cardiology for surgery today by Dr. Cindie. Cleared by neurology for surgery today by Dr. Callander   PMH: HTN, HLD, DM, bladder ca (2010)  PSH: He denies any prior abdominal surgical history. He denies any history of anal abscess drainage procedures.  FHx: +FHx of Crohn's disease - his daughter. Denies any known family history of colorectal, breast, endometrial or ovarian cancer  Social Hx: Denies use of tobacco/drugs. He is happily retired. He previously worked for [employer] at [employer] in [place] before it shut down. Helped  turn this into a [employer] plant.    Past Medical History:  Diagnosis Date   Anal fistula    Chronic kidney disease    Hidradenitis    History of bladder cancer 2009   urologist-- dr rosalind---  s/p TURBT 2010,  recurrence in office 2014 ,  none since per pt   History of colonic polyps    History of COVID-19 04/2019   per pt had covid pneumonia recovered at home, no oxygen, symptoms resolved    History of diverticulitis of colon    History of hypertension 04/05/2009   per the pt he lost 55 lbs and htn has been reversed   History of kidney stones    Hydradenitis    Hyperlipidemia 04/05/2009   pt states he does not take med for cholesterol   Hypertension    OA (osteoarthritis) 04/05/2009   Pneumonia    Psoriatic arthritis (HCC)    rheumonotologist--- dr ronal jacob   [facility]    2024   Type 2 diabetes mellitus (HCC) 04/05/2009   followed by pcp---  (01-31-2021  per pt only checks blood sugar once weekly, last fasting sugar-- 122)    Past Surgical History:  Procedure Laterality Date   COLONOSCOPY     last one 11-04-2020  by gessner   EXTRACORPOREAL SHOCK WAVE LITHOTRIPSY  2015   INCISION AND DRAINAGE ABSCESS N/A 02/03/2021   Procedure: EXCISION OF PERIANAL AND PERINEAL HYDRADENITIS, INTERROGATION OF PERIANAL WOUNDS;  Surgeon: Teresa Lonni HERO, MD;  Location: [facility];  Service: General;  Laterality: N/A;   PARTIAL COLECTOMY N/A 02/26/2023   Procedure: EXPLORATORY LAPAROTOMY; PARTIAL COLECTOMY; COLOSTOMY;  Surgeon: Vernetta Berg, MD;  Location: [facility];  Service: General;  Laterality: N/A;   RECTAL EXAM UNDER ANESTHESIA  02/03/2021   Procedure: ANORECTAL EXAM UNDER ANESTHESIA;  Surgeon: Teresa Lonni HERO, MD;  Location: [facility];  Service: General;;   TONSILLECTOMY     child   TRANSURETHRAL RESECTION OF BLADDER TUMOR  02/2008    Family History  Problem Relation Age of Onset   Diabetes Mother    Hypertension Mother    Arthritis Father    Crohn's disease Daughter    Esophageal cancer Neg Hx    Colon cancer Neg Hx    Pancreatic cancer Neg Hx    Stomach cancer Neg Hx    Colon polyps Neg Hx    Rectal cancer Neg Hx     Social:  reports that he has been smoking cigarettes and cigars. He has never used smokeless tobacco. He reports current alcohol use of about 14.0 standard drinks of alcohol per week. He reports that he does  not use drugs.  Allergies:  Allergies  Allergen Reactions   Penicillin G Hives   Penicillins Hives    And fever blisters   Adalimumab Other (See Comments)    Wasn't effective   Metformin  And Related Other (See Comments)    Upset stomach    Medications: I have reviewed the patient's current medications.  No results found for this or any previous visit (from the past 48 hours).  No results found.   PE There were no vitals taken for this visit. Constitutional: NAD; conversant Eyes: Moist conjunctiva; no lid lag; anicteric Lungs: Normal respiratory effort CV: RRR Psychiatric: Appropriate affect  No results found for this or any previous visit (from the past 48 hours).  No results found.  A/P: Joshua [name] is an 68 y.o. male with hx  of HTN, HLD, DM, bladder ca (2010), psoriatic arthritis (on Enbrel) - hx hidradenitis OR 02/03/21 for excision of what appeared most consistent with hidradenitis   Now s/p exlap/Hartmann's 02/26/23 for chronic diverticulitis   - Pathology benign - For quality-of-life stated reasons, he is highly motivated undergo surgery to reverse his colostomy.   GGE 05/2023 - clear   Cscope 01/2024 - clear   Right MCA stroke - following with Dr. Modena Callander with neurology - cleared by neurology for surgery  Cardiac clearance also obtained for surgery today   Parastomal Hernia + colostomy status Possible small hernia around colostomy site -Would be closed primarily with colostomy reversal surgery   -The anatomy and physiology of the GI tract was reviewed with the patient as it pertains to his current anatomy with a colostomy.  For quality-of-life stated reasons, he is highly motivated to proceed with colostomy reversal surgery.  He understands that this is a elective procedure and not typically a life limiting condition.   -We have discussed various different treatment options going forward including surgery (the most definitive) to address this  -robotic assisted colostomy takedown; lysis of adhesions; flexible sigmoidoscopy; cystoscopy/ureteral ICG (firefly)-[facility] -The planned procedure, material risks (including, but not limited to, pain, bleeding, infection, scarring, need for blood transfusion, damage to surrounding structures- blood vessels/nerves/viscus/organs, damage to ureter, urine leak, leak from anastomosis, need for additional procedures, sexual dysfunction, low anterior resection syndrome (LARS) = increased fecal urgency and/or frequency, scenarios where a stoma may be necessary and where it may be permanent, worsening of pre-existing medical conditions, chronic diarrhea, constipation secondary to narcotic use, hernia, recurrence, pneumonia, heart attack, stroke, death) benefits and alternatives to surgery were discussed at length. The patient's questions were answered to his and his wife's satisfaction, they voiced understanding and elected to proceed with surgery. Additionally, we discussed typical postoperative expectations and the recovery process.  Lonni Pizza, MD [facility], [facility]

## 2024-01-17 NOTE — Op Note (Signed)
 PATIENT: Joshua [name]  68 y.o. male  Patient Care Team: Micheal Wolm ORN, MD as PCP - General Cindie Ole DASEN, MD as PCP - Electrophysiology (Cardiology)  PREOP DIAGNOSIS: DIVERTICULITIS  POSTOP DIAGNOSIS: DIVERTICULITIS  PROCEDURE:  Robotic assisted colostomy takedown with colorectal anastomosis (Hartman's reversal) Robotic lysis of adhesions x 80 minutes Flexible sigmoidoscopy Bilateral transversus abdominus plane (TAP) blocks  SURGEON: Lonni HERO. Teresa, MD  ASSISTANT: Elspeth Schultze, MD  An experienced assistant was required given the complexity of this procedure and the standard of surgical care. My assistant helped with exposure through counter tension, suctioning, ligation and retraction to better visualize the surgical field. My assistant expedited sewing during the case by following my sutures. Wherever I use the term we in the report, my assistant actively helped me with that portion of the procedure.   ANESTHESIA: General endotracheal  EBL: 50 mL Total I/O In: 1900 [I.V.:1900] Out: 550 [Urine:500; Blood:50]  DRAINS: none  SPECIMEN: colostomy  COUNTS: Sponge, needle and instrument counts were reported correct x2  FINDINGS: Omental containing adhesions across the midline; small bowel adhesions across the right lower quadrant and within the pelvis.  Healthy appearing staple line on the proximal rectum with Prolene suture in situ. A well perfused, tension free, hemostatic, air tight 29 mm EEA colorectal anastomosis fashioned 15 cm from the anal verge by flexible sigmoidoscopy.   NARRATIVE: The patient was seen in the pre-op holding area. The risks, benefits, complications, treatment options, and expected outcomes were previously discussed with the patient. The patient agreed with the proposed plan and has signed the informed consent form. The patient was brought to the operating room by the surgical team, identified as Joshua [name], and the procedure  verified. placed supine on the operating table and SCD's were applied. General endotracheal anesthesia was induced without difficulty. He was then positioned in the lithotomy position with Allen stirrups.  Pressure points were evaluated and padded.  A foley catheter was then placed by nursing under sterile conditions. Hair on the abdomen was clipped.  He was secured to the operating table.  Dr. Shane with [facility] came in for his portion of the procedure.  Please refer to his note for details. The abdomen was then prepped and draped in the standard sterile fashion. A time out was completed and the above information confirmed and need for preoperative antibiotics.  An OG tube was placed by anesthesia and confirmed to be to suction.  At Palmer's point, a stab incision was created and the Veress needle was introduced into the peritoneal cavity on the first attempt.  Intraperitoneal location was confirmed by the aspiration and saline drop test.  Pneumoperitoneum was established to a maximum pressure of 15 mmHg using CO2.  Following this, the abdomen was marked for planned trocar sites.  Just to the right and cephalad to the umbilicus, an 8 mm incision was created and an 8 mm blunt tipped robotic trocar was cautiously placed into the peritoneal cavity.  The laparoscope was inserted and demonstrated no evidence of trocar site nor Veress needle site complications.  The Veress needle was removed.  Bilateral transversus abdominis plane blocks were then created using a dilute mixture of Exparel  with Marcaine .  3 additional 8 mm robotic trochars were placed under direct visualization roughly in a line extending from the right ASIS towards the left upper quadrant. The bladder was inspected and noted to be at/below the pubic symphysis.  Staying 3 fingerbreadths above the pubic symphysis, an incision was  created and the 12 mm robotic trocar inserted directed cephalad into the peritoneal cavity under direct  visualization.  An additional 5 mm assist port was placed in the right lateral abdomen under direct visualization.  The abdomen was surveyed and there was omental containing adhesions across the lower midline abdomen.  He was positioned in Trendelenburg with the left side tilted slightly up. The robot was then docked and I went to the console.  We began with adhesiolysis. Adhesions consisting of omentum were carefully taken down from the abdominal wall using our vessel sealer.  All omental containing lesions were carefully taken down.  Cut surface were inspected and noted to be hemostatic.  We encountered adhesions of small bowel in the right lower quadrant which were carefully taken down sharply.  Adhesions were fairly thin and able to be lysed without any evident serosal injuries.  Next, we turned our attention to the pelvis.  Adhesions consisting of small bowel were carefully mobilized out of the pelvis.  This was done sharply as well.  Cut surfaces of adhesions have been present were then carefully inspected and again no serosal injuries were identified.  Small bowel was then mobilized from the descending mesocolon.  This was done sharply.  This was also then inspected and noted to be free of any injury.  Omental containing adhesions were then freed from the descending colostomy.  Total time with adhesiolysis is approximately 80 minutes.   The proximal rectal stump was readily identified in the pelvis.  Prolene sutures are noted to be present.  The proximal rectal stump is healthy in appearance.  There is not appear to be any significant remnant sigmoid colon.  At the location where the prior staple line is present we noted that there are no appendices epiploica and the Tennie appear circumferential.  This anatomically represents the proximal rectum. The rectal stump was grasped and elevated anteriorly.  The left and right ureters were both identified and well lateral to the pelvic midline in their expected  anatomic locations.  The rectal stump was then partially mobilized maintaining a TME level dissection working between the fascia propria the rectum and the presacral fascia.  In doing so, the rectal stump is quite mobile.  It is well-perfused in appearance and healthy.     Attention was then directed at the colostomy.  We were able to free all adhesions from around the colostomy sharply using scissors.  The descending colon had been fully mobilized up to the level of the splenic flexure by incising the Carnel Stegman line of Toldt.  The associate mesocolon have been reflected medially.  There is a visible pulse in the mesentery going all the way up to the level of the colostomy.  The descending colon is normal in appearance.  I then scrubbed back in and we undocked the robot.  We turned our attention to taking down the colostomy.  The colostomy was circumferentially incised.  The colon and associated mesocolon was carefully dissected free from the surrounding tissue using Metzenbaum scissors.  The colon was then inspected and there was no apparent injury to the mesentery or serosa.  A pursestring device was then applied to the colostomy.  A pursestring consisting of 2-0 Prolene was placed.  The colostomy was then excised and passed off the specimen.  EEA sizers were passed and a 29 mm EEA stapler selected.  Belt loops consisting of 3-0 silk were placed around the pursestring suture.  The anvil was placed on the pursestring tied.  A small amount of fat was cleared to facilitate an appropriate anastomosis.  There are no apparent diverticula within the planned anastomosis.  There is a palpable pulse in the mesentery going all the way out to our pursestring.  This is then placed back into the abdomen.  An Alexis wound protector with an associated cap is placed.  Pneumoperitoneum is reestablished.    I then went below to pass the stapler.  My partner remained above.  Digital rectal exam demonstrates no palpable  abnormalities.  There is some retained mucus within the rectum that we are able to digitally disimpact without difficulty.  Lubricated EEA sizers were cautiously introduced via the anus and advanced under direct visualization.  The EEA stapler was passed and the spike deployed just anterior to the proximal rectal staple line.  The components were then mated.  Orientation was confirmed such that there is no twisting of the colon nor small bowel underneath the mesenteric defect. Care was taken to ensure no other structures were incorporated within this either.  The stapler was then closed, held, and fired. This was then removed. The donuts were inspected and noted to be complete.  The colon proximal to the anastomosis was then gently occluded. The pelvis was filled with sterile irrigation. Under direct visualization, I passed a flexible sigmoidoscope.  The anastomosis was under water .  With good distention of the anastomosis there was no air leak. The anastomosis pink in appearance.  This is located at 15 cm from the anal verge by flexible sigmoidoscopy.  It is hemostatic.  Additionally, looking from above, there is no tension on the colon or mesentery.  Sigmoidoscope was withdrawn.  Irrigation was evacuated from the pelvis.  The abdomen and pelvis are surveyed and noted to be completely hemostatic without any apparent injury.  Under direct visualization, all trochars are removed.  The Alexis wound protector was removed. I scrubbed back in.  The fascia at the previous colostomy site is then closed using 2 running #1 PDS sutures in a longitudinal manner as this results in a tension-free closure.  The fascia is palpated and noted to be completely closed.  A 2-0 Vicryl suture was used to create a pursestring at the level of the skin.  This is cinched down to a fingerbreadth in diameter.  This is then wicked with a moist kerlex x1.  All sponge, needle, and instrument counts were reported correct x2.  The skin of all  other incisions was closed with 4-0 Monocryl subcuticular suture. Dermabond was placed over all incisions.  The former colostomy site was dressed with 4 x 4's and tape.   He was then taken out of lithotomy, awakened from anesthesia, extubated, and transferred to a stretcher for transport to PACU in satisfactory condition having tolerated the procedure well.

## 2024-01-17 NOTE — Anesthesia Procedure Notes (Signed)
 Procedure Name: Intubation Date/Time: 01/17/2024 12:10 PM  Performed by: Nanci Riis, CRNAPre-anesthesia Checklist: Patient identified, Emergency Drugs available, Suction available, Patient being monitored and Timeout performed Patient Re-evaluated:Patient Re-evaluated prior to induction Oxygen Delivery Method: Circle system utilized Preoxygenation: Pre-oxygenation with 100% oxygen Induction Type: IV induction Ventilation: Mask ventilation without difficulty Laryngoscope Size: Glidescope and 4 Grade View: Grade I Tube type: Oral Tube size: 7.5 mm Number of attempts: 1 Airway Equipment and Method: Stylet Placement Confirmation: ETT inserted through vocal cords under direct vision, positive ETCO2 and breath sounds checked- equal and bilateral Secured at: 21 cm Tube secured with: Tape Dental Injury: Teeth and Oropharynx as per pre-operative assessment

## 2024-01-17 NOTE — Op Note (Signed)
 Operative Note  Preoperative diagnosis:  1.  Diverticulitis  Postoperative diagnosis: 1.  Diverticulitis  Procedure(s): 1.  Cystoscopy 2. Firefly instillation in bilateral ureters 3.  Bilateral retrograde pyelogram   Surgeon: Steffan Pea MD  Assistants:  None  Anesthesia:  General  Complications:  None  EBL:  None for my portion of the case  Specimens: 1. None for my portion of the case  Drains/Catheters: 1.  16 fr catheter   Intraoperative findings:   Bladder without suspicious bladder lesions.  Retrograde pyelogram interpretation: Bilateral retrograde pyelograms demonstrating that firefly reach the renal collecting system and fill the entirety of the ureter.  No filling defects noted.   Indication:  61 M w/ hx of colonic diverticulitis now planned to have ostomy take down with Dr. Teresa.    Description of procedure: The patient was identified and surgical site verification was performed prior to obtaining consent.  The patient was brought to the operating suite.  Under adequate general anesthesia the patient was positioned in dorsal lithotomy and prepped and draped.  A preoperative Time Out was performed addressing the anticipated surgical site, procedure, and safety precautions.  The 21Fr cystoscope was inserted into the patient's urethral meatus and advanced to the bladder.   The bladder was inspected with the 30 degree lens.  The ureteral orifices were in their normal anatomic positions.  The bladder showed no trabeculation.  There were no bladder tumors noted.  The right orifice was intubated with a sensor wire and a 5 [place] open ended catheter was placed over the wire into the right ureter and advanced to 25 cm.  The firefly was mixed with the contrast I then slowly pulled back and injected 7.5ml of the firefly contrast.  Retrograde pyelogram was taken demonstrating that the firefly had reached the renal pelvis.  I subsequently turned my attention to the  patient's left ureteral orifice and performed a similar task, injecting 7.47ml of the firefly solution. in a retrograde fashion in the left ureter.   I then  placed a 16 [place] Foley.  The case was then turned over to Dr. Joleen team for the remainder of the procedure.  Steffan Pea MD [facility]

## 2024-01-18 ENCOUNTER — Encounter (HOSPITAL_COMMUNITY): Payer: Self-pay | Admitting: Surgery

## 2024-01-18 ENCOUNTER — Ambulatory Visit: Payer: Self-pay | Admitting: Internal Medicine

## 2024-01-18 ENCOUNTER — Ambulatory Visit: Admitting: Family Medicine

## 2024-01-18 LAB — CBC
HCT: 48.4 % (ref 39.0–52.0)
Hemoglobin: 16.1 g/dL (ref 13.0–17.0)
MCH: 31.5 pg (ref 26.0–34.0)
MCHC: 33.3 g/dL (ref 30.0–36.0)
MCV: 94.7 fL (ref 80.0–100.0)
Platelets: 217 K/uL (ref 150–400)
RBC: 5.11 MIL/uL (ref 4.22–5.81)
RDW: 13.1 % (ref 11.5–15.5)
WBC: 10.3 K/uL (ref 4.0–10.5)
nRBC: 0 % (ref 0.0–0.2)

## 2024-01-18 LAB — BASIC METABOLIC PANEL WITH GFR
Anion gap: 8 (ref 5–15)
BUN: 13 mg/dL (ref 8–23)
CO2: 23 mmol/L (ref 22–32)
Calcium: 9.1 mg/dL (ref 8.9–10.3)
Chloride: 103 mmol/L (ref 98–111)
Creatinine, Ser: 0.9 mg/dL (ref 0.61–1.24)
GFR, Estimated: >60 mL/min (ref >60)
Glucose, Bld: 174 mg/dL — ABNORMAL HIGH (ref 70–99)
Potassium: 4.5 mmol/L (ref 3.5–5.1)
Sodium: 134 mmol/L — ABNORMAL LOW (ref 135–145)

## 2024-01-18 MED ORDER — TRAMADOL HCL 50 MG PO TABS: 50.0000 mg | ORAL_TABLET | Freq: Four times a day (QID) | ORAL | 0 refills | Status: AC | PRN

## 2024-01-18 NOTE — Discharge Instructions (Signed)
 POST OP INSTRUCTIONS AFTER COLON SURGERY  DIET: Be sure to include lots of fluids daily to stay hydrated - 64oz of water  per day (8, 8 oz glasses).  Avoid fast food or heavy meals for the first couple of weeks as your are more likely to get nauseated. Avoid raw/uncooked fruits or vegetables for the first 4 weeks (its ok to have these if they are blended into smoothie form). If you have fruits/vegetables, make sure they are cooked until soft enough to mash on the roof of your mouth and chew your food well. Otherwise, diet as tolerated.  Take your usually prescribed home medications unless otherwise directed.  PAIN CONTROL: Pain is best controlled by a usual combination of three different methods TOGETHER: Ice/Heat Over the counter pain medication Prescription pain medication Most patients will experience some swelling and bruising around the surgical site.  Ice packs or heating pads (30-60 minutes up to 6 times a day) will help. Some people prefer to use ice alone, heat alone, alternating between ice & heat.  Experiment to what works for you.  Swelling and bruising can take several weeks to resolve.   It is helpful to take an over-the-counter pain medication regularly for the first few weeks: Ibuprofen  (Motrin /Advil ) - 200mg  tabs - take 3 tabs (600mg ) every 6 hours as needed for pain (unless you have been directed previously to avoid NSAIDs/ibuprofen ) Acetaminophen  (Tylenol ) - you may take 650mg  every 6 hours as needed. You can take this with motrin  as they act differently on the body. If you are taking a narcotic pain medication that has acetaminophen  in it, do not take over the counter tylenol  at the same time. NOTE: You may take both of these medications together - most patients  find it most helpful when alternating between the two (i.e. Ibuprofen  at 6am, tylenol  at 9am, ibuprofen  at 12pm ..SABRA) A  prescription for pain medication should be given to you upon discharge.  Take your pain medication as  prescribed if your pain is not adequatly controlled with the over-the-counter pain reliefs mentioned above.  Avoid getting constipated.  Between the surgery and the pain medications, it is common to experience some constipation.  Increasing fluid intake and taking a fiber supplement (such as Metamucil, Citrucel, FiberCon, MiraLax , etc) 1-2 times a day regularly will usually help prevent this problem from occurring.  A mild laxative (prune juice, Milk of Magnesia, MiraLax , etc) should be taken according to package directions if there are no bowel movements after 48 hours.    Dressing: Your incisions are covered in Dermabond which is like sterile superglue for the skin. This will come off on it's own in a couple weeks. It is waterproof and you may bathe normally in a shower. Former ostomy site - cover with dry gauze and change daily. Ok to get this wet in the shower with soap/water  over the wound. Avoid baths/pools/lakes/oceans until your wounds have fully healed.  ACTIVITIES as tolerated:   Avoid heavy lifting (>10lbs or 1 gallon of milk) for the next 6 weeks. You may resume regular daily activities as tolerated--such as daily self-care, walking, climbing stairs--gradually increasing activities as tolerated.  If you can walk 30 minutes without difficulty, it is safe to try more intense activity such as jogging, treadmill, bicycling, low-impact aerobics.  DO NOT PUSH THROUGH PAIN.  Let pain be your guide: If it hurts to do something, don't do it. You may drive when you are no longer taking prescription pain medication, you can comfortably  wear a seatbelt, and you can safely maneuver your car and apply brakes.  FOLLOW UP in our office Please call [facility] at [phone] to set up an appointment to see your surgeon in the office for a follow-up appointment approximately 2 weeks after your surgery. Make sure that you call for this appointment the day you arrive home to insure a convenient appointment  time.  9. If you have disability or family leave forms that need to be completed, you may have them completed by your primary care physician's office; for return to work instructions, please ask our office staff and they will be happy to assist you in obtaining this documentation   When to call us  [phone]: Poor pain control Reactions / problems with new medications (rash/itching, etc)  Fever over 101.5 F (38.5 C) Inability to urinate Nausea/vomiting Worsening swelling or bruising Continued bleeding from incision. Increased pain, redness, or drainage from the incision  The clinic staff is available to answer your questions during regular business hours (8:30am-5pm).  Please don't hesitate to call and ask to speak to one of our nurses for clinical concerns.   A surgeon from [facility] is always on call at the hospitals   If you have a medical emergency, go to the nearest emergency room or call 911.  [facility] [address], [address], [place], [place]  [address] MAIN: [phone] FAX: [phone] [email or web address]

## 2024-01-18 NOTE — Plan of Care (Signed)

## 2024-01-18 NOTE — Progress Notes (Signed)
   01/18/24 1125  TOC Brief Assessment  Insurance and Status Reviewed  Patient has primary care physician Yes  Home environment has been reviewed resides in private residence  Prior level of function: Independent  Prior/Current Home Services No current home services  Social Drivers of Health Review SDOH reviewed no interventions necessary  Readmission risk has been reviewed Yes  Transition of care needs no transition of care needs at this time

## 2024-01-18 NOTE — Progress Notes (Addendum)
  Subjective No acute events. Feeling well, soreness like he did 100 situps. Tolerating diet without n/v. Foley out. Passing lots of flatus; had BM as well.  Objective: Vital signs in last 24 hours: Temp:  [97.4 F (36.3 C)-98.9 F (37.2 C)] 97.8 F (36.6 C) (08/15 0613) Pulse Rate:  [69-93] 73 (08/15 0613) Resp:  [13-21] 17 (08/15 0613) BP: (113-149)/(65-86) 139/70 (08/15 0613) SpO2:  [97 %-100 %] 97 % (08/15 9386) Weight:  [86.2 kg-87.6 kg] 87.6 kg (08/15 0614) Last BM Date : 01/17/24  Intake/Output from previous day: 08/14 0701 - 08/15 0700 In: 4302.8 [P.O.:1440; I.V.:2862.8] Out: 3300 [Urine:3250; Blood:50] Intake/Output this shift: Total I/O In: -  Out: 25 [Urine:25]  Gen: NAD, comfortable CV: RRR Pulm: Normal work of breathing Abd: Soft, minimal tenderness, nondistended; incisions dressed/dry; ostomy site with dressing and packing in place. Ext: SCDs in place  Lab Results: CBC  Recent Labs    01/17/24 1658 01/18/24 0425  WBC 9.9 10.3  HGB 16.8 16.1  HCT 51.0 48.4  PLT 210 217   BMET Recent Labs    01/17/24 1658 01/18/24 0425  NA  --  134*  K  --  4.5  CL  --  103  CO2  --  23  GLUCOSE  --  174*  BUN  --  13  CREATININE 1.03 0.90  CALCIUM   --  9.1   PT/INR No results for input(s): LABPROT, INR in the last 72 hours. ABG No results for input(s): PHART, HCO3 in the last 72 hours.  Invalid input(s): PCO2, PO2  Studies/Results:  Anti-infectives: Anti-infectives (From admission, onward)    Start     Dose/Rate Route Frequency Ordered Stop   01/17/24 1400  neomycin  (MYCIFRADIN ) tablet 1,000 mg  Status:  Discontinued       Placed in And Linked Group   1,000 mg Oral 3 times per day 01/17/24 1033 01/17/24 1035   01/17/24 1400  metroNIDAZOLE  (FLAGYL ) tablet 1,000 mg  Status:  Discontinued       Placed in And Linked Group   1,000 mg Oral 3 times per day 01/17/24 1033 01/17/24 1035   01/17/24 1045  cefoTEtan  (CEFOTAN ) 2 g in sodium  chloride 0.9 % 100 mL IVPB        2 g 200 mL/hr over 30 Minutes Intravenous On call to O.R. 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   Rectal bleeding 03/31/2020   Psoriatic arthritis (HCC) 02/21/2016   History of colonic polyp - sessile serrated polyp 03/12/2014   SHOULDER PAIN 01/05/2010   Malignant neoplasm of bladder (HCC) 04/05/2009   Type 2 diabetes mellitus (HCC) 04/05/2009   Hyperlipidemia 04/05/2009   Essential hypertension 04/05/2009   Osteoarthritis 04/05/2009   s/p Procedure(s): CLOSURE, COLOSTOMY, ROBOT-ASSISTED LYSIS, ADHESIONS, LAPAROSCOPIC SIGMOIDOSCOPY, FLEXIBLE CYSTOSCOPY WITH INDOCYANINE GREEN  IMAGING (ICG) 01/17/2024  - Ambulate 5x/day - D/C IVF; foley out - Adv to diabetic reg diet - We spent time reviewing his procedure, findings and plans. Reviewed expectations as well. All questions answered. - PPX: SQH, SCDs - Dispo: Possible discharge tomorrow if continues to do well   LOS: 1 day   Lonni Pizza, MD [facility]

## 2024-01-18 NOTE — Progress Notes (Signed)
 Patient had bowel movement. Medium size. Green/brown appearance. Plan of care ongoing.

## 2024-01-19 ENCOUNTER — Other Ambulatory Visit: Payer: Self-pay | Admitting: Family Medicine

## 2024-01-19 LAB — CBC
HCT: 47.6 % (ref 39.0–52.0)
Hemoglobin: 15.5 g/dL (ref 13.0–17.0)
MCH: 31.8 pg (ref 26.0–34.0)
MCHC: 32.6 g/dL (ref 30.0–36.0)
MCV: 97.5 fL (ref 80.0–100.0)
Platelets: 203 K/uL (ref 150–400)
RBC: 4.88 MIL/uL (ref 4.22–5.81)
RDW: 13.7 % (ref 11.5–15.5)
WBC: 10.4 K/uL (ref 4.0–10.5)
nRBC: 0 % (ref 0.0–0.2)

## 2024-01-19 LAB — BASIC METABOLIC PANEL WITH GFR
Anion gap: 9 (ref 5–15)
BUN: 18 mg/dL (ref 8–23)
CO2: 24 mmol/L (ref 22–32)
Calcium: 9 mg/dL (ref 8.9–10.3)
Chloride: 103 mmol/L (ref 98–111)
Creatinine, Ser: 1.15 mg/dL (ref 0.61–1.24)
GFR, Estimated: >60 mL/min (ref >60)
Glucose, Bld: 116 mg/dL — ABNORMAL HIGH (ref 70–99)
Potassium: 3.9 mmol/L (ref 3.5–5.1)
Sodium: 136 mmol/L (ref 135–145)

## 2024-01-19 NOTE — Plan of Care (Signed)
  Problem: Education: Goal: Understanding of discharge needs will improve Outcome: Progressing Goal: Verbalization of understanding of the causes of altered bowel function will improve Outcome: Progressing   Problem: Activity: Goal: Ability to tolerate increased activity will improve Outcome: Progressing   Problem: Bowel/Gastric: Goal: Gastrointestinal status for postoperative course will improve Outcome: Progressing   Problem: Health Behavior/Discharge Planning: Goal: Identification of community resources to assist with postoperative recovery needs will improve Outcome: Progressing   Problem: Nutritional: Goal: Will attain and maintain optimal nutritional status will improve Outcome: Progressing   Problem: Clinical Measurements: Goal: Postoperative complications will be avoided or minimized Outcome: Progressing

## 2024-01-19 NOTE — Progress Notes (Signed)
 AVS reviewed w/ pt and wife- both verbalized an understanding- Pt dressed for d/c to home- no other questions at this time - to lobby via w/c - home w/ wife

## 2024-01-19 NOTE — Discharge Summary (Signed)
 Physician Discharge Summary  Patient ID: Joshua [name] MRN: [id number] DOB/AGE: April 21, 1956 68 y.o.  Admit date: 01/17/2024 Discharge date: 01/19/2024  Admission Diagnoses: Colostomy present Discharge Diagnoses:  Principal Problem:   S/P colostomy takedown   Discharged Condition: good  Hospital Course: Patient was admitted to the med surg floor after surgery.  Diet was advanced as tolerated.  Patient began to have bowel function on postop day 2.  By postop day 2, she was tolerating a solid diet and pain was controlled with oral medications.  She was urinating without difficulty and ambulating without assistance.  Patient was felt to be in stable condition for discharge to home.   Consults: None  Significant Diagnostic Studies: labs: cbc, bmet  Treatments: IV hydration and surgery: robotic colostomy reversal  Discharge Exam: Blood pressure 130/71, pulse 65, temperature 98 F (36.7 C), temperature source Oral, resp. rate 16, height 5' 8 (1.727 m), weight 87.6 kg, SpO2 99%. General appearance: alert and cooperative GI: soft, non-distended Incision/Wound: clean, dry, intact  Disposition: Discharge disposition: 01-Home or Self Care        Allergies as of 01/19/2024       Reactions   Penicillin G Hives   Penicillins Hives   And fever blisters   Adalimumab Other (See Comments)   Wasn't effective   Metformin  And Related Other (See Comments)   Upset stomach        Medication List     TAKE these medications    acetaminophen  500 MG tablet Commonly known as: TYLENOL  Take 2 tablets (1,000 mg total) by mouth every 6 (six) hours as needed.   aspirin 81 MG chewable tablet Chew 81 mg by mouth daily.   Bayer Microlet Lancets lancets Check Blood Sugar Once Daily.   betamethasone  dipropionate 0.05 % lotion Apply 1 Application topically daily as needed (irritation).   CINNAMON PO Take 1 capsule by mouth daily.   clindamycin  1 % lotion Commonly known as: CLEOCIN   T Apply 1 application  topically daily as needed (irritation).   Cosentyx Sensoready (300 MG) 150 MG/ML Soaj Generic drug: Secukinumab (300 MG Dose) Inject 300 mg into the skin every 28 (twenty-eight) days.   empagliflozin  10 MG Tabs tablet Commonly known as: Jardiance  Take 1 tablet (10 mg total) by mouth daily.   MULTIVITAMIN DROPS/FLUORIDE PO Take 1 tablet by mouth daily.   OneTouch Ultra test strip Generic drug: glucose blood USE AS DIRECTED TO CHECK BLOOD GLUCOSE ONE TIME DAILY   Ozempic  (0.25 or 0.5 MG/DOSE) 2 MG/3ML Sopn Generic drug: Semaglutide (0.25 or 0.5MG /DOS) Inject 0.25 mg into the skin once a week.   polyethylene glycol 17 g packet Commonly known as: MIRALAX  / GLYCOLAX  Take 17 g by mouth daily as needed for mild constipation.   rosuvastatin  10 MG tablet Commonly known as: Crestor  Take 1 tablet (10 mg total) by mouth daily.   traMADol  50 MG tablet Commonly known as: ULTRAM  Take 1 tablet (50 mg total) by mouth every 6 (six) hours as needed for up to 7 days (postop pain not controlled with ibuprofen  first).   triamcinolone  cream 0.1 % Commonly known as: KENALOG  Apply 1 Application topically 2 (two) times daily as needed (irritation).        Follow-up Information     Teresa Lonni HERO, MD Follow up on 01/30/2024.   Specialties: General Surgery, Colon and Rectal Surgery Why: Please arrive by 10:00 am Contact information: [address] [place] [address] [place] [address] [phone]  Signed: Janeka Libman C Vayla Wilhelmi 01/19/2024, 9:05 AM

## 2024-01-19 NOTE — Plan of Care (Signed)
 Problem: Education: Goal: Understanding of discharge needs will improve Outcome: Adequate for Discharge Goal: Verbalization of understanding of the causes of altered bowel function will improve Outcome: Adequate for Discharge   Problem: Activity: Goal: Ability to tolerate increased activity will improve Outcome: Adequate for Discharge   Problem: Bowel/Gastric: Goal: Gastrointestinal status for postoperative course will improve Outcome: Adequate for Discharge   Problem: Health Behavior/Discharge Planning: Goal: Identification of community resources to assist with postoperative recovery needs will improve Outcome: Adequate for Discharge   Problem: Nutritional: Goal: Will attain and maintain optimal nutritional status will improve Outcome: Adequate for Discharge   Problem: Clinical Measurements: Goal: Postoperative complications will be avoided or minimized Outcome: Adequate for Discharge   Problem: Respiratory: Goal: Respiratory status will improve Outcome: Adequate for Discharge   Problem: Skin Integrity: Goal: Will show signs of wound healing Outcome: Adequate for Discharge   Problem: Education: Goal: Knowledge of General Education information will improve Description: Including pain rating scale, medication(s)/side effects and non-pharmacologic comfort measures Outcome: Adequate for Discharge   Problem: Health Behavior/Discharge Planning: Goal: Ability to manage health-related needs will improve Outcome: Adequate for Discharge   Problem: Clinical Measurements: Goal: Ability to maintain clinical measurements within normal limits will improve Outcome: Adequate for Discharge Goal: Will remain free from infection Outcome: Adequate for Discharge Goal: Diagnostic test results will improve Outcome: Adequate for Discharge Goal: Respiratory complications will improve Outcome: Adequate for Discharge Goal: Cardiovascular complication will be avoided Outcome: Adequate for  Discharge   Problem: Activity: Goal: Risk for activity intolerance will decrease Outcome: Adequate for Discharge   Problem: Nutrition: Goal: Adequate nutrition will be maintained Outcome: Adequate for Discharge   Problem: Coping: Goal: Level of anxiety will decrease Outcome: Adequate for Discharge   Problem: Elimination: Goal: Will not experience complications related to bowel motility Outcome: Adequate for Discharge Goal: Will not experience complications related to urinary retention Outcome: Adequate for Discharge   Problem: Pain Managment: Goal: General experience of comfort will improve and/or be controlled Outcome: Adequate for Discharge   Problem: Safety: Goal: Ability to remain free from injury will improve Outcome: Adequate for Discharge   Problem: Skin Integrity: Goal: Risk for impaired skin integrity will decrease Outcome: Adequate for Discharge   Problem: Education: Goal: Knowledge of General Education information will improve Description: Including pain rating scale, medication(s)/side effects and non-pharmacologic comfort measures Outcome: Adequate for Discharge   Problem: Health Behavior/Discharge Planning: Goal: Ability to manage health-related needs will improve Outcome: Adequate for Discharge   Problem: Clinical Measurements: Goal: Ability to maintain clinical measurements within normal limits will improve Outcome: Adequate for Discharge Goal: Will remain free from infection Outcome: Adequate for Discharge Goal: Diagnostic test results will improve Outcome: Adequate for Discharge Goal: Respiratory complications will improve Outcome: Adequate for Discharge Goal: Cardiovascular complication will be avoided Outcome: Adequate for Discharge   Problem: Activity: Goal: Risk for activity intolerance will decrease Outcome: Adequate for Discharge   Problem: Nutrition: Goal: Adequate nutrition will be maintained Outcome: Adequate for Discharge    Problem: Coping: Goal: Level of anxiety will decrease Outcome: Adequate for Discharge   Problem: Elimination: Goal: Will not experience complications related to bowel motility Outcome: Adequate for Discharge Goal: Will not experience complications related to urinary retention Outcome: Adequate for Discharge   Problem: Pain Managment: Goal: General experience of comfort will improve and/or be controlled Outcome: Adequate for Discharge   Problem: Safety: Goal: Ability to remain free from injury will improve Outcome: Adequate for Discharge   Problem: Skin Integrity: Goal:  Risk for impaired skin integrity will decrease Outcome: Adequate for Discharge

## 2024-01-21 ENCOUNTER — Ambulatory Visit (INDEPENDENT_AMBULATORY_CARE_PROVIDER_SITE_OTHER)

## 2024-01-21 ENCOUNTER — Telehealth: Payer: Self-pay

## 2024-01-21 DIAGNOSIS — I639 Cerebral infarction, unspecified: Secondary | ICD-10-CM

## 2024-01-21 LAB — SURGICAL PATHOLOGY

## 2024-01-21 NOTE — Transitions of Care (Post Inpatient/ED Visit) (Signed)
   01/21/2024  Name: RONELL DUFFUS MRN: [id number] DOB: September 27, 1955  Today's TOC FU Call Status: Today's TOC FU Call Status:: Successful TOC FU Call Completed TOC FU Call Complete Date: 01/21/24 Patient's Name and Date of Birth confirmed.  Transition Care Management Follow-up Telephone Call Date of Discharge: 01/19/24 Discharge Facility: [facility]) Type of Discharge: Inpatient Admission Primary Inpatient Discharge Diagnosis:: Colostomy present How have you been since you were released from the hospital?: Better (Doing well. reports bowels are moving) Any questions or concerns?: No  Items Reviewed: Did you receive and understand the discharge instructions provided?: Yes Medications obtained,verified, and reconciled?: Yes (Medications Reviewed)  Placed call to patient. Reviewed reason for call. Patient reports that he understands all his instructions. Reports that he has all his medications. Patient declines call or assessment. Encouraged patient to call MD for any questions. I also provided my contact information.   TOC call not completed and assessments not completed.   Alan Ee, RN, BSN, CEN [employer]- Transition of Care Team.  [facility] [phone]

## 2024-01-22 LAB — CUP PACEART REMOTE DEVICE CHECK
Date Time Interrogation Session: 20250816234232
Implantable Pulse Generator Implant Date: 20250410

## 2024-01-25 ENCOUNTER — Ambulatory Visit: Payer: Self-pay | Admitting: Cardiology

## 2024-01-30 ENCOUNTER — Encounter: Payer: Self-pay | Admitting: Family Medicine

## 2024-01-31 ENCOUNTER — Encounter

## 2024-02-01 NOTE — Anesthesia Postprocedure Evaluation (Signed)
 Anesthesia Post Note  Patient: Joshua [name]  Procedure(s) Performed: CLOSURE, COLOSTOMY, ROBOT-ASSISTED LYSIS, ADHESIONS, LAPAROSCOPIC SIGMOIDOSCOPY, FLEXIBLE CYSTOSCOPY WITH INDOCYANINE GREEN  IMAGING (ICG)     Patient location during evaluation: PACU Anesthesia Type: General Level of consciousness: awake and alert Pain management: pain level controlled Vital Signs Assessment: post-procedure vital signs reviewed and stable Respiratory status: spontaneous breathing, nonlabored ventilation, respiratory function stable and patient connected to nasal cannula oxygen Cardiovascular status: blood pressure returned to baseline and stable Postop Assessment: no apparent nausea or vomiting Anesthetic complications: no   No notable events documented.  Last Vitals:  Vitals:   01/18/24 2034 01/19/24 0537  BP: 128/64 130/71  Pulse: 73 65  Resp: 16 16  Temp: 36.8 C 36.7 C  SpO2: 99% 99%    Last Pain:  Vitals:   01/19/24 0940  TempSrc:   PainSc: 5                  Goldy Calandra

## 2024-02-04 DIAGNOSIS — E119 Type 2 diabetes mellitus without complications: Secondary | ICD-10-CM | POA: Diagnosis not present

## 2024-02-06 ENCOUNTER — Ambulatory Visit: Admitting: Family Medicine

## 2024-02-06 ENCOUNTER — Encounter: Payer: Self-pay | Admitting: Family Medicine

## 2024-02-06 VITALS — BP 132/78 | HR 76 | Temp 97.7°F | Wt 189.1 lb

## 2024-02-06 DIAGNOSIS — E119 Type 2 diabetes mellitus without complications: Secondary | ICD-10-CM | POA: Diagnosis not present

## 2024-02-06 DIAGNOSIS — Z7985 Long-term (current) use of injectable non-insulin antidiabetic drugs: Secondary | ICD-10-CM

## 2024-02-06 DIAGNOSIS — E785 Hyperlipidemia, unspecified: Secondary | ICD-10-CM | POA: Diagnosis not present

## 2024-02-06 DIAGNOSIS — Z8673 Personal history of transient ischemic attack (TIA), and cerebral infarction without residual deficits: Secondary | ICD-10-CM | POA: Diagnosis not present

## 2024-02-06 LAB — LIPID PANEL
Cholesterol: 152 mg/dL (ref 0–200)
HDL: 43.6 mg/dL (ref >39.00)
LDL Cholesterol: 65 mg/dL (ref 0–99)
NonHDL: 108.33
Total CHOL/HDL Ratio: 3
Triglycerides: 217 mg/dL — ABNORMAL HIGH (ref 0.0–149.0)
VLDL: 43.4 mg/dL — ABNORMAL HIGH (ref 0.0–40.0)

## 2024-02-06 LAB — MICROALBUMIN / CREATININE URINE RATIO
Creatinine,U: 78.8 mg/dL
Microalb Creat Ratio: 11 mg/g (ref 0.0–30.0)
Microalb, Ur: 0.9 mg/dL (ref 0.0–1.9)

## 2024-02-06 LAB — HEPATIC FUNCTION PANEL
ALT: 34 U/L (ref 0–53)
AST: 23 U/L (ref 0–37)
Albumin: 4.4 g/dL (ref 3.5–5.2)
Alkaline Phosphatase: 87 U/L (ref 39–117)
Bilirubin, Direct: 0.1 mg/dL (ref 0.0–0.3)
Total Bilirubin: 0.7 mg/dL (ref 0.2–1.2)
Total Protein: 8.2 g/dL (ref 6.0–8.3)

## 2024-02-06 MED ORDER — EMPAGLIFLOZIN 25 MG PO TABS: 25.0000 mg | ORAL_TABLET | Freq: Every day | ORAL | 3 refills | Status: AC

## 2024-02-06 NOTE — Progress Notes (Signed)
 Established Patient Office Visit  Subjective   Patient ID: Joshua [name], male    DOB: 03/05/1956  Age: 68 y.o. MRN: [id number]  Chief Complaint  Patient presents with   Medical Management of Chronic Issues    HPI    Joshua [name] is seen following recent hospitalization for colostomy closure.  He had colostomy following complicated diverticulitis.  Surgery went well and has done well since surgery.  He states his weight is actually gone down some since surgery but he has good appetite.  He quit taking Ozempic .  Did not really have any nausea but did not like how he felt overall with that.  He has had previous intolerance with metformin .  He did have recent A1c of 7.1 on 8 - 7 - 25.  Currently taking Jardiance  10 mg daily.  Inquires about possibly bumping this up to 25 mg.  He had CVA last year and most recent LDL cholesterol 78 with goal less than 70.  We had bumped his rosuvastatin  from 5 to 10 mg.  Tolerating well with no myalgias.  Needs follow-up lipids at this time.  Also needs urine microalbumin screen.  Past Medical History:  Diagnosis Date   Anal fistula    Chronic kidney disease    Hidradenitis    History of bladder cancer 2009   urologist-- dr rosalind---  s/p TURBT 2010,  recurrence in office 2014 ,  none since per pt   History of colonic polyps    History of COVID-19 04/2019   per pt had covid pneumonia recovered at home, no oxygen, symptoms resolved   History of diverticulitis of colon    History of hypertension 04/05/2009   per the pt he lost 55 lbs and htn has been reversed   History of kidney stones    Hydradenitis    Hyperlipidemia 04/05/2009   pt states he does not take med for cholesterol   Hypertension    OA (osteoarthritis) 04/05/2009   Pneumonia    Psoriatic arthritis (HCC)    rheumonotologist--- dr ronal jacob   Stroke [facility])    2024   Type 2 diabetes mellitus (HCC) 04/05/2009   followed by pcp---  (01-31-2021  per pt only checks blood sugar once weekly,  last fasting sugar-- 122)   Past Surgical History:  Procedure Laterality Date   COLONOSCOPY     last one 11-04-2020  by gessner   CYSTOSCOPY WITH INDOCYANINE GREEN  IMAGING (ICG) N/A 01/17/2024   Procedure: CYSTOSCOPY WITH INDOCYANINE GREEN  IMAGING (ICG);  Surgeon: Shane Steffan BROCKS, MD;  Location: [facility];  Service: Urology;  Laterality: N/A;  FIREFLYER   EXTRACORPOREAL SHOCK WAVE LITHOTRIPSY  2015   FLEXIBLE SIGMOIDOSCOPY N/A 01/17/2024   Procedure: KINGSTON SIDE;  Surgeon: Teresa Lonni HERO, MD;  Location: [facility];  Service: General;  Laterality: N/A;   INCISION AND DRAINAGE ABSCESS N/A 02/03/2021   Procedure: EXCISION OF PERIANAL AND PERINEAL HYDRADENITIS, INTERROGATION OF PERIANAL WOUNDS;  Surgeon: Teresa Lonni HERO, MD;  Location: [facility];  Service: General;  Laterality: N/A;   LAPAROSCOPIC LYSIS OF ADHESIONS N/A 01/17/2024   Procedure: LYSIS, ADHESIONS, LAPAROSCOPIC;  Surgeon: Teresa Lonni HERO, MD;  Location: [facility];  Service: General;  Laterality: N/A;   PARTIAL COLECTOMY N/A 02/26/2023   Procedure: EXPLORATORY LAPAROTOMY; PARTIAL COLECTOMY; COLOSTOMY;  Surgeon: Vernetta Berg, MD;  Location: [facility];  Service: General;  Laterality: N/A;   RECTAL EXAM UNDER ANESTHESIA  02/03/2021   Procedure: ANORECTAL EXAM UNDER ANESTHESIA;  Surgeon: Teresa,  Lonni HERO, MD;  Location: [facility];  Service: General;;   TONSILLECTOMY     child   TRANSURETHRAL RESECTION OF BLADDER TUMOR  02/2008   XI ROBOTIC ASSISTED COLOSTOMY TAKEDOWN N/A 01/17/2024   Procedure: CLOSURE, COLOSTOMY, ROBOT-ASSISTED;  Surgeon: Teresa Lonni HERO, MD;  Location: [facility];  Service: General;  Laterality: N/A;    reports that he has been smoking cigarettes and cigars. He has never used smokeless tobacco. He reports current alcohol use of about 14.0 standard drinks of alcohol per week. He reports that he does not use drugs. family history includes Arthritis in his father;  Crohn's disease in his daughter; Diabetes in his mother; Hypertension in his mother. Allergies  Allergen Reactions   Penicillin G Hives   Penicillins Hives    And fever blisters   Adalimumab Other (See Comments)    Wasn't effective   Metformin  And Related Other (See Comments)    Upset stomach    Review of Systems  Constitutional:  Negative for chills, fever and malaise/fatigue.  Eyes:  Negative for blurred vision.  Respiratory:  Negative for shortness of breath.   Cardiovascular:  Negative for chest pain.  Neurological:  Negative for dizziness, weakness and headaches.      Objective:     BP 132/78 (BP Location: Left Arm, Cuff Size: Normal)   Pulse 76   Temp 97.7 F (36.5 C) (Oral)   Wt 189 lb 1.6 oz (85.8 kg)   SpO2 98%   BMI 28.75 kg/m  BP Readings from Last 3 Encounters:  02/06/24 132/78  01/19/24 130/71  01/11/24 126/77   Wt Readings from Last 3 Encounters:  02/06/24 189 lb 1.6 oz (85.8 kg)  01/18/24 193 lb 2 oz (87.6 kg)  01/11/24 190 lb (86.2 kg)      Physical Exam Vitals reviewed.  Constitutional:      Appearance: He is well-developed.  Eyes:     Pupils: Pupils are equal, round, and reactive to light.  Neck:     Thyroid : No thyromegaly.  Cardiovascular:     Rate and Rhythm: Normal rate and regular rhythm.  Pulmonary:     Effort: Pulmonary effort is normal. No respiratory distress.     Breath sounds: Normal breath sounds. No wheezing or rales.  Abdominal:     Comments: Abdominal wounds are healing well from recent colostomy closure  Musculoskeletal:     Cervical back: Neck supple.     Right lower leg: No edema.     Left lower leg: No edema.  Neurological:     Mental Status: He is alert and oriented to person, place, and time.      No results found for any visits on 02/06/24.  Last CBC Lab Results  Component Value Date   WBC 10.4 01/19/2024   HGB 15.5 01/19/2024   HCT 47.6 01/19/2024   MCV 97.5 01/19/2024   MCH 31.8 01/19/2024   RDW  13.7 01/19/2024   PLT 203 01/19/2024   Last metabolic panel Lab Results  Component Value Date   GLUCOSE 116 (H) 01/19/2024   NA 136 01/19/2024   K 3.9 01/19/2024   CL 103 01/19/2024   CO2 24 01/19/2024   BUN 18 01/19/2024   CREATININE 1.15 01/19/2024   GFRNONAA >60 01/19/2024   CALCIUM  9.0 01/19/2024   PROT 8.2 (H) 01/10/2024   ALBUMIN 4.2 01/10/2024   BILITOT 0.7 01/10/2024   ALKPHOS 88 01/10/2024   AST 29 01/10/2024   ALT 43 01/10/2024  ANIONGAP 9 01/19/2024   Last lipids Lab Results  Component Value Date   CHOL 151 10/12/2023   HDL 46.30 10/12/2023   LDLCALC 78 10/12/2023   LDLDIRECT 131.0 09/02/2018   TRIG 131.0 10/12/2023   CHOLHDL 3 10/12/2023   Last hemoglobin A1c Lab Results  Component Value Date   HGBA1C 7.1 (H) 01/10/2024      The ASCVD Risk score (Arnett DK, et al., 2019) failed to calculate for the following reasons:   Risk score cannot be calculated because patient has a medical history suggesting prior/existing ASCVD    Assessment & Plan:   #1 type 2 diabetes with history of reasonably good control with recent A1c 7.1%.  Currently on Jardiance  10 mg daily.  We explained that usually see minimal improvements from 10 to 25 mg but we can certainly try 25 mg dosage and reassess in 3 months.  If not further to goal with A1c less than 7 at that time consider other options.  Might consider DPP 4 inhibitor or possibly Actos at that time if not at goal.  Needs urine microalbumin screening this will be checked today.  Will plan 6-month follow-up to reassess A1c  #2 history of CVA.  Goal LDL less than 70.  Patient on rosuvastatin  10 mg daily.  Check lipid and hepatic panel today   Return in about 3 months (around 05/07/2024).    Wolm Scarlet, MD

## 2024-02-07 ENCOUNTER — Ambulatory Visit: Payer: Self-pay | Admitting: Family Medicine

## 2024-02-21 ENCOUNTER — Ambulatory Visit (INDEPENDENT_AMBULATORY_CARE_PROVIDER_SITE_OTHER)

## 2024-02-21 ENCOUNTER — Ambulatory Visit: Payer: Self-pay | Admitting: Cardiology

## 2024-02-21 DIAGNOSIS — I639 Cerebral infarction, unspecified: Secondary | ICD-10-CM | POA: Diagnosis not present

## 2024-02-21 LAB — CUP PACEART REMOTE DEVICE CHECK
Date Time Interrogation Session: 20250916234030
Implantable Pulse Generator Implant Date: 20250410

## 2024-02-26 NOTE — Progress Notes (Signed)
 Remote Loop Recorder Transmission

## 2024-02-27 NOTE — Progress Notes (Signed)
 Remote Loop Recorder Transmission

## 2024-03-05 DIAGNOSIS — K922 Gastrointestinal hemorrhage, unspecified: Secondary | ICD-10-CM

## 2024-03-05 HISTORY — DX: Gastrointestinal hemorrhage, unspecified: K92.2

## 2024-03-06 ENCOUNTER — Encounter

## 2024-03-11 NOTE — Progress Notes (Signed)
 Remote Loop Recorder Transmission

## 2024-03-24 ENCOUNTER — Ambulatory Visit: Attending: Cardiology

## 2024-03-24 ENCOUNTER — Encounter

## 2024-03-24 DIAGNOSIS — I639 Cerebral infarction, unspecified: Secondary | ICD-10-CM

## 2024-03-25 ENCOUNTER — Observation Stay (HOSPITAL_BASED_OUTPATIENT_CLINIC_OR_DEPARTMENT_OTHER)
Admission: EM | Admit: 2024-03-25 | Discharge: 2024-03-27 | Disposition: A | Discharge status: Home / Self Care | Attending: Internal Medicine | Admitting: Internal Medicine

## 2024-03-25 ENCOUNTER — Emergency Department (HOSPITAL_BASED_OUTPATIENT_CLINIC_OR_DEPARTMENT_OTHER)

## 2024-03-25 ENCOUNTER — Encounter (HOSPITAL_BASED_OUTPATIENT_CLINIC_OR_DEPARTMENT_OTHER): Payer: Self-pay | Admitting: Emergency Medicine

## 2024-03-25 ENCOUNTER — Other Ambulatory Visit: Payer: Self-pay

## 2024-03-25 DIAGNOSIS — K5731 Diverticulosis of large intestine without perforation or abscess with bleeding: Secondary | ICD-10-CM | POA: Diagnosis not present

## 2024-03-25 DIAGNOSIS — Z433 Encounter for attention to colostomy: Secondary | ICD-10-CM | POA: Insufficient documentation

## 2024-03-25 DIAGNOSIS — L4052 Psoriatic arthritis mutilans: Secondary | ICD-10-CM | POA: Diagnosis not present

## 2024-03-25 DIAGNOSIS — E119 Type 2 diabetes mellitus without complications: Secondary | ICD-10-CM

## 2024-03-25 DIAGNOSIS — I639 Cerebral infarction, unspecified: Secondary | ICD-10-CM | POA: Diagnosis present

## 2024-03-25 DIAGNOSIS — Z7982 Long term (current) use of aspirin: Secondary | ICD-10-CM | POA: Insufficient documentation

## 2024-03-25 DIAGNOSIS — E1122 Type 2 diabetes mellitus with diabetic chronic kidney disease: Secondary | ICD-10-CM | POA: Diagnosis not present

## 2024-03-25 DIAGNOSIS — Z8551 Personal history of malignant neoplasm of bladder: Secondary | ICD-10-CM | POA: Insufficient documentation

## 2024-03-25 DIAGNOSIS — Z9889 Other specified postprocedural states: Secondary | ICD-10-CM | POA: Diagnosis not present

## 2024-03-25 DIAGNOSIS — Z9049 Acquired absence of other specified parts of digestive tract: Secondary | ICD-10-CM | POA: Diagnosis not present

## 2024-03-25 DIAGNOSIS — K922 Gastrointestinal hemorrhage, unspecified: Principal | ICD-10-CM | POA: Insufficient documentation

## 2024-03-25 DIAGNOSIS — I129 Hypertensive chronic kidney disease with stage 1 through stage 4 chronic kidney disease, or unspecified chronic kidney disease: Secondary | ICD-10-CM | POA: Diagnosis not present

## 2024-03-25 DIAGNOSIS — Z743 Need for continuous supervision: Secondary | ICD-10-CM | POA: Diagnosis not present

## 2024-03-25 DIAGNOSIS — Z7984 Long term (current) use of oral hypoglycemic drugs: Secondary | ICD-10-CM | POA: Insufficient documentation

## 2024-03-25 DIAGNOSIS — K921 Melena: Secondary | ICD-10-CM | POA: Diagnosis not present

## 2024-03-25 DIAGNOSIS — N189 Chronic kidney disease, unspecified: Secondary | ICD-10-CM | POA: Insufficient documentation

## 2024-03-25 DIAGNOSIS — E785 Hyperlipidemia, unspecified: Secondary | ICD-10-CM

## 2024-03-25 DIAGNOSIS — Z8673 Personal history of transient ischemic attack (TIA), and cerebral infarction without residual deficits: Secondary | ICD-10-CM | POA: Diagnosis not present

## 2024-03-25 DIAGNOSIS — L4050 Arthropathic psoriasis, unspecified: Secondary | ICD-10-CM | POA: Diagnosis not present

## 2024-03-25 DIAGNOSIS — Z8719 Personal history of other diseases of the digestive system: Secondary | ICD-10-CM

## 2024-03-25 DIAGNOSIS — K625 Hemorrhage of anus and rectum: Secondary | ICD-10-CM | POA: Diagnosis not present

## 2024-03-25 LAB — HEMOGLOBIN AND HEMATOCRIT, BLOOD
HCT: 42.9 % (ref 39.0–52.0)
HCT: 37.7 % — ABNORMAL LOW (ref 39.0–52.0)
Hemoglobin: 14.2 g/dL (ref 13.0–17.0)
Hemoglobin: 12.2 g/dL — ABNORMAL LOW (ref 13.0–17.0)

## 2024-03-25 LAB — CUP PACEART REMOTE DEVICE CHECK
Date Time Interrogation Session: 20251019234411
Implantable Pulse Generator Implant Date: 20250410

## 2024-03-25 LAB — CBC
HCT: 43.5 % (ref 39.0–52.0)
Hemoglobin: 15 g/dL (ref 13.0–17.0)
MCH: 32.7 pg (ref 26.0–34.0)
MCHC: 34.5 g/dL (ref 30.0–36.0)
MCV: 94.8 fL (ref 80.0–100.0)
Platelets: 198 K/uL (ref 150–400)
RBC: 4.59 MIL/uL (ref 4.22–5.81)
RDW: 13.2 % (ref 11.5–15.5)
WBC: 8.3 K/uL (ref 4.0–10.5)
nRBC: 0 % (ref 0.0–0.2)

## 2024-03-25 LAB — COMPREHENSIVE METABOLIC PANEL WITH GFR
ALT: 41 U/L (ref 0–44)
AST: 26 U/L (ref 15–41)
Albumin: 4.3 g/dL (ref 3.5–5.0)
Alkaline Phosphatase: 106 U/L (ref 38–126)
Anion gap: 15 (ref 5–15)
BUN: 19 mg/dL (ref 8–23)
CO2: 22 mmol/L (ref 22–32)
Calcium: 10 mg/dL (ref 8.9–10.3)
Chloride: 102 mmol/L (ref 98–111)
Creatinine, Ser: 1.03 mg/dL (ref 0.61–1.24)
GFR, Estimated: >60 mL/min (ref >60)
Glucose, Bld: 198 mg/dL — ABNORMAL HIGH (ref 70–99)
Potassium: 4.1 mmol/L (ref 3.5–5.1)
Sodium: 138 mmol/L (ref 135–145)
Total Bilirubin: 0.8 mg/dL (ref 0.0–1.2)
Total Protein: 7.9 g/dL (ref 6.5–8.1)

## 2024-03-25 LAB — CBC WITH DIFFERENTIAL/PLATELET
Abs Immature Granulocytes: 0.02 K/uL (ref 0.00–0.07)
Basophils Absolute: 0.1 K/uL (ref 0.0–0.1)
Basophils Relative: 1 %
Eosinophils Absolute: 0.2 K/uL (ref 0.0–0.5)
Eosinophils Relative: 2 %
HCT: 48.2 % (ref 39.0–52.0)
Hemoglobin: 16.9 g/dL (ref 13.0–17.0)
Immature Granulocytes: 0 %
Lymphocytes Relative: 25 %
Lymphs Abs: 2 K/uL (ref 0.7–4.0)
MCH: 32.9 pg (ref 26.0–34.0)
MCHC: 35.1 g/dL (ref 30.0–36.0)
MCV: 94 fL (ref 80.0–100.0)
Monocytes Absolute: 0.6 K/uL (ref 0.1–1.0)
Monocytes Relative: 8 %
Neutro Abs: 5.2 K/uL (ref 1.7–7.7)
Neutrophils Relative %: 64 %
Platelets: 189 K/uL (ref 150–400)
RBC: 5.13 MIL/uL (ref 4.22–5.81)
RDW: 12.8 % (ref 11.5–15.5)
WBC: 8.1 K/uL (ref 4.0–10.5)
nRBC: 0 % (ref 0.0–0.2)

## 2024-03-25 LAB — CBG MONITORING, ED: Glucose-Capillary: 164 mg/dL — ABNORMAL HIGH (ref 70–99)

## 2024-03-25 LAB — GLUCOSE, CAPILLARY: Glucose-Capillary: 143 mg/dL — ABNORMAL HIGH (ref 70–99)

## 2024-03-25 LAB — TYPE AND SCREEN
ABO/RH(D): A POS
Antibody Screen: NEGATIVE

## 2024-03-25 MED ORDER — IOHEXOL 350 MG/ML SOLN
100.0000 mL | Freq: Once | INTRAVENOUS | Status: AC | PRN
  Administered 2024-03-25: 100 mL via INTRAVENOUS

## 2024-03-25 MED ORDER — PANTOPRAZOLE SODIUM 40 MG IV SOLR
80.0000 mg | Freq: Once | INTRAVENOUS | Status: AC
Start: 2024-03-25 — End: 2024-03-25
  Administered 2024-03-25: 40 mg via INTRAVENOUS
  Filled 2024-03-25: qty 20

## 2024-03-25 MED ORDER — ONDANSETRON HCL 4 MG PO TABS: 4.0000 mg | ORAL_TABLET | Freq: Four times a day (QID) | ORAL | Status: DC | PRN

## 2024-03-25 MED ORDER — IOHEXOL 9 MG/ML PO SOLN
500.0000 mL | Freq: Once | ORAL | Status: AC
  Administered 2024-03-25: 500 mL via ORAL

## 2024-03-25 MED ORDER — LACTATED RINGERS IV SOLN: INTRAVENOUS | Status: AC

## 2024-03-25 MED ORDER — ORAL CARE MOUTH RINSE: 15.0000 mL | OROMUCOSAL | Status: DC | PRN

## 2024-03-25 MED ORDER — PANTOPRAZOLE SODIUM 40 MG IV SOLR
40.0000 mg | Freq: Two times a day (BID) | INTRAVENOUS | Status: DC
  Administered 2024-03-25 – 2024-03-27 (×4): 40 mg via INTRAVENOUS
  Filled 2024-03-25 (×4): qty 10

## 2024-03-25 MED ORDER — CHLORHEXIDINE GLUCONATE CLOTH 2 % EX PADS
6.0000 | MEDICATED_PAD | Freq: Every day | CUTANEOUS | Status: DC
  Administered 2024-03-25 – 2024-03-27 (×3): 6 via TOPICAL

## 2024-03-25 MED ORDER — INSULIN ASPART 100 UNIT/ML IJ SOLN
0.0000 [IU] | Freq: Three times a day (TID) | INTRAMUSCULAR | Status: DC
  Administered 2024-03-26: 2 [IU] via SUBCUTANEOUS
  Administered 2024-03-26: 1 [IU] via SUBCUTANEOUS
  Filled 2024-03-25: qty 0.06

## 2024-03-25 MED ORDER — ACETAMINOPHEN 325 MG PO TABS
650.0000 mg | ORAL_TABLET | Freq: Four times a day (QID) | ORAL | Status: DC | PRN
  Administered 2024-03-26: 650 mg via ORAL
  Filled 2024-03-25: qty 2

## 2024-03-25 MED ORDER — ACETAMINOPHEN 650 MG RE SUPP: 650.0000 mg | Freq: Four times a day (QID) | RECTAL | Status: DC | PRN

## 2024-03-25 MED ORDER — IOHEXOL 9 MG/ML PO SOLN: 500.0000 mL | ORAL | Status: DC

## 2024-03-25 MED ORDER — ONDANSETRON HCL 4 MG/2ML IJ SOLN: 4.0000 mg | Freq: Four times a day (QID) | INTRAMUSCULAR | Status: DC | PRN

## 2024-03-25 MED ORDER — LACTATED RINGERS IV BOLUS
1000.0000 mL | Freq: Once | INTRAVENOUS | Status: AC
  Administered 2024-03-25: 1000 mL via INTRAVENOUS

## 2024-03-25 MED ORDER — SODIUM CHLORIDE 0.9 % IV SOLN: INTRAVENOUS | Status: AC

## 2024-03-25 NOTE — ED Notes (Signed)
 Charge at [facility] given report... [facility] given report.SABRASABRA

## 2024-03-25 NOTE — ED Notes (Signed)
 Called [facility] to transport the patient to [facility] is accepting

## 2024-03-25 NOTE — ED Provider Notes (Addendum)
 [place] [facility] AT [facility] Provider Note   CSN: [id number] Arrival date & time: 03/25/24  9268     Patient presents with: Rectal Bleeding   Joshua [name] is a 68 y.o. male.   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  The patient states that he woke up this morning and had what he thought was a normal bowel movement but noticed that there was significant bleeding.  He denies any abdominal pain now.  States that he did have some mild lightheadedness when this occurred but denies any currently.  He reports that he has been doing well since the colostomy reversal on the 14th.  He came to the ER today for further evaluation regarding this.  He states that he did have a colonoscopy that showed diverticulosis but no other acute findings prior to reversal.   Rectal Bleeding      Prior to Admission medications   Medication Sig Start Date End Date Taking? Authorizing Provider  acetaminophen  (TYLENOL ) 500 MG tablet Take 2 tablets (1,000 mg total) by mouth every 6 (six) hours as needed. 03/02/23   Augustus Almarie RAMAN, PA-C  aspirin 81 MG chewable tablet Chew 81 mg by mouth daily.    [provider]  BAYER MICROLET LANCETS lancets Check Blood Sugar Once Daily. 02/21/16   Burchette, Wolm ORN, MD  betamethasone  dipropionate 0.05 % lotion Apply 1 Application topically daily as needed (irritation). 03/26/23   [provider]  CINNAMON PO Take 1 capsule by mouth daily.    [provider]  clindamycin  (CLEOCIN  T) 1 % lotion Apply 1 application  topically daily as needed (irritation). 03/07/21   [provider]  COSENTYX SENSOREADY, 300 MG, 150 MG/ML SOAJ Inject 300 mg into the skin every 28 (twenty-eight) days.    [provider]   empagliflozin  (JARDIANCE ) 25 MG TABS tablet Take 1 tablet (25 mg total) by mouth daily before breakfast. 02/06/24   Burchette, Wolm ORN, MD  ONETOUCH ULTRA test strip USE AS DIRECTED TO CHECK BLOOD GLUCOSE ONE TIME DAILY 08/29/19   Burchette, Wolm ORN, MD  Pediatric Multivitamins-Fl (MULTIVITAMIN DROPS/FLUORIDE PO) Take 1 tablet by mouth daily.    [provider]  polyethylene glycol (MIRALAX  / GLYCOLAX ) 17 g packet Take 17 g by mouth daily as needed for mild constipation. 03/02/23   Augustus Almarie RAMAN, PA-C  rosuvastatin  (CRESTOR ) 10 MG tablet TAKE 1 TABLET(10 MG) BY MOUTH DAILY 01/21/24   Burchette, Wolm ORN, MD  triamcinolone  cream (KENALOG ) 0.1 % Apply 1 Application topically 2 (two) times daily as needed (irritation). 12/29/22   [provider]    Allergies: Penicillin g, Penicillins, Adalimumab, and Metformin  and related    Review of Systems  Gastrointestinal:  Positive for blood in stool and hematochezia.  All other systems reviewed and are negative.   Updated Vital Signs BP 112/70   Pulse 78   Temp 98.2 F (36.8 C) (Oral)   Resp 14   SpO2 100%   Physical Exam Vitals and nursing note reviewed.   Gen: NAD Eyes: PERRL, EOMI HEENT: no oropharyngeal swelling Neck: trachea midline Resp: clear to auscultation bilaterally Card: RRR, no murmurs, rubs, or gallops Abd: nontender, nondistended Extremities: no calf tenderness, no edema Vascular: 2+ radial pulses bilaterally, 2+ DP pulses bilaterally Skin: no rashes Psyc: acting appropriately   (  all labs ordered are listed, but only abnormal results are displayed) Labs Reviewed  COMPREHENSIVE METABOLIC PANEL WITH GFR - Abnormal; Notable for the following components:      Result Value   Glucose, Bld 198 (*)    All other components within normal limits  CBC WITH DIFFERENTIAL/PLATELET  CBC    EKG: None  Radiology: No results found.   Procedures   Medications Ordered in the ED  iohexol  (OMNIPAQUE ) 9 MG/ML  oral solution 500 mL (500 mLs Oral Contrast Given 03/25/24 0909)  iohexol  (OMNIPAQUE ) 350 MG/ML injection 100 mL (100 mLs Intravenous Contrast Given 03/25/24 1125)  lactated ringers  bolus 1,000 mL (0 mLs Intravenous Stopped 03/25/24 1255)                                    Medical Decision Making 68 year old male with past medical history of diverticulosis and diverticulitis with recent colostomy reversal presenting to the emergency department today with rectal bleeding.  Patient did show me a photo of this and this does appear to be significant.  I will obtain basic labs here to evaluate for anemia and discussed this case with general surgery given his recent reversal but at this point he does not have any abdominal tenderness so we will hold off on imaging until further discussion with surgery.  I will reevaluate for ultimate disposition patient will likely require observation at a minimum given the large volume hematochezia.  The patient's initial hemoglobin is stable.  Discussed his case with surgery who recommended CT imaging.  CT angiogram is ordered with oral contrast as well per the recommendation.  The patient did have 2 large bloody bowel movements while here.  He was somewhat symptomatic with these but remained hemodynamically stable.  Repeat hemoglobin was relatively stable but downtrending.  Discussed his case with Dr. Celinda from the hospitalist service.  The patient be admitted for further evaluation.  I also discussed this with Greig Corti from gastroenterology who has made the attending gastroenterologist aware that the patient will be admitted.  The patient did have another bloody stool here and is becoming more symptomatic.  With minimal exertion now his heart rate is in the 120s to 130s.  At rest his blood pressures are fine and heart rate does improved.  In light of this I did discuss his case with Dr. Randol at [facility].  Plan is for transfer to [facility], ED to ED in the  event that the patient requires transfusion which I suspect he will given his symptoms and ongoing bright red blood per rectum.  Amount and/or Complexity of Data Reviewed Labs: ordered. Radiology: ordered.  Risk Prescription drug management. Decision regarding hospitalization.        Final diagnoses:  Hematochezia    ED Discharge Orders     None          Ula Prentice SAUNDERS, MD 03/25/24 1309    Ula Prentice SAUNDERS, MD 03/25/24 1320

## 2024-03-25 NOTE — Consult Note (Signed)
 [place] J Barkalow 1956-02-20  [id number].    Requesting MD: Ula, MD Chief Complaint/Reason for Consult: Rectal bleeding, remote history of colorectal surgery  HPI:  Joshua [name] is a 68 y.o. male with history of psoriatic arthritis (on Enbrel), HTN, HLD, DM, bladder ca (2010), anal fistula, and diverticulitis. He has a history of left anal fistula formally diagnosed in 2022 and he underwent surgical excision of perianal and perineal hidradenitis in September 2022 - so ultimately his perineal disease was attributed to hidradenitis and not communicating with the anal canal. He has a history recurrent diverticulitis and did not get elective surgery for this but ultimately presented with urgent need for laparotomy and hartmanns procedure 02/26/23 (Dr. Vernetta). Path without malagnancy. He had a stroke 05/2023 and is on ASA and crestor . On 01/17/24 he underwent robotic takedown of colostomy, lysis of adhesions by Dr. Teresa. He was seen in our office 8/27 and at that time was recovering well.    Today, patient states that he had what he thought was a normal bowel movement this morning but noticed that there was a significant amount of blood in the toilet. This led him to the ED for further evaluation. He states that he has had about 6 more bloody stools since presenting to the ED. He states that the stools are dark red and significant in amount. He reports that he has been eating well since his surgery and having regular bowel movements up to this point. His last meal was dinner last night. Patient denies nausea, vomiting, abdominal pain, fevers, chills, SOB, and chest pain.    Labs in ED: CBC WNL CMP WNL with exception of flucose (198) Repeat CBC WNL   CT Angio Abd/Pel W and/or WO contrast: No acute findings in the abd/pelvis. No evidence of lower GI bleed. Aortic atherosclerosis.    General surgery has been asked to consult.    Other medical history: FHx of crohn's (daughter) Other  surgical history: Transurethral resection of bladder tumor, tonsillectomy, rectal exam under anesthesia, excision of perianal abscess, Extracorporeal shock wave lithotripsy Colonoscopy: Had prior to take down 2025 Anticoagulation/Antiplatelets: Aspirin Illicit drug use: Denies Tobacco use: Has smoked occasional cigar. Former cigarette smoker (quit 1981) Allergies: Penicillin g, Penicillins, Adalimumab, and Metformin  and related     ROS: Per HPI.  Family History  Problem Relation Age of Onset   Diabetes Mother    Hypertension Mother    Arthritis Father    Crohn's disease Daughter    Esophageal cancer Neg Hx    Colon cancer Neg Hx    Pancreatic cancer Neg Hx    Stomach cancer Neg Hx    Colon polyps Neg Hx    Rectal cancer Neg Hx     Past Medical History:  Diagnosis Date   Anal fistula    Chronic kidney disease    Hidradenitis    History of bladder cancer 2009   urologist-- dr rosalind---  s/p TURBT 2010,  recurrence in office 2014 ,  none since per pt   History of colonic polyps    History of COVID-19 04/2019   per pt had covid pneumonia recovered at home, no oxygen, symptoms resolved   History of diverticulitis of colon    History of hypertension 04/05/2009   per the pt he lost 55 lbs and htn has been reversed   History of kidney stones    Hydradenitis    Hyperlipidemia 04/05/2009   pt states he does not take med  for cholesterol   Hypertension    OA (osteoarthritis) 04/05/2009   Pneumonia    Psoriatic arthritis (HCC)    rheumonotologist--- dr ronal jacob   Stroke [facility])    2024   Type 2 diabetes mellitus (HCC) 04/05/2009   followed by pcp---  (01-31-2021  per pt only checks blood sugar once weekly, last fasting sugar-- 122)    Past Surgical History:  Procedure Laterality Date   COLONOSCOPY     last one 11-04-2020  by gessner   CYSTOSCOPY WITH INDOCYANINE GREEN  IMAGING (ICG) N/A 01/17/2024   Procedure: CYSTOSCOPY WITH INDOCYANINE GREEN  IMAGING (ICG);  Surgeon:  Shane Steffan BROCKS, MD;  Location: [facility];  Service: Urology;  Laterality: N/A;  FIREFLYER   EXTRACORPOREAL SHOCK WAVE LITHOTRIPSY  2015   FLEXIBLE SIGMOIDOSCOPY N/A 01/17/2024   Procedure: KINGSTON SIDE;  Surgeon: Teresa Lonni HERO, MD;  Location: [facility];  Service: General;  Laterality: N/A;   INCISION AND DRAINAGE ABSCESS N/A 02/03/2021   Procedure: EXCISION OF PERIANAL AND PERINEAL HYDRADENITIS, INTERROGATION OF PERIANAL WOUNDS;  Surgeon: Teresa Lonni HERO, MD;  Location: [facility];  Service: General;  Laterality: N/A;   LAPAROSCOPIC LYSIS OF ADHESIONS N/A 01/17/2024   Procedure: LYSIS, ADHESIONS, LAPAROSCOPIC;  Surgeon: Teresa Lonni HERO, MD;  Location: [facility];  Service: General;  Laterality: N/A;   PARTIAL COLECTOMY N/A 02/26/2023   Procedure: EXPLORATORY LAPAROTOMY; PARTIAL COLECTOMY; COLOSTOMY;  Surgeon: Vernetta Berg, MD;  Location: [facility];  Service: General;  Laterality: N/A;   RECTAL EXAM UNDER ANESTHESIA  02/03/2021   Procedure: ANORECTAL EXAM UNDER ANESTHESIA;  Surgeon: Teresa Lonni HERO, MD;  Location: [facility];  Service: General;;   TONSILLECTOMY     child   TRANSURETHRAL RESECTION OF BLADDER TUMOR  02/2008   XI ROBOTIC ASSISTED COLOSTOMY TAKEDOWN N/A 01/17/2024   Procedure: CLOSURE, COLOSTOMY, ROBOT-ASSISTED;  Surgeon: Teresa Lonni HERO, MD;  Location: [facility];  Service: General;  Laterality: N/A;    Social History:  reports that he has been smoking cigarettes and cigars. He has never used smokeless tobacco. He reports current alcohol use of about 14.0 standard drinks of alcohol per week. He reports that he does not use drugs.  Allergies:  Allergies  Allergen Reactions   Penicillin G Hives   Penicillins Hives    And fever blisters   Adalimumab Other (See Comments)    Wasn't effective   Metformin  And Related Other (See Comments)    Upset stomach    (Not in a hospital admission)    Physical Exam: Blood pressure  133/83, pulse 95, temperature 98.2 F (36.8 C), temperature source Oral, resp. rate 17, SpO2 100%. General: Pleasant male who is laying in bed in NAD. HEENT: Head is normocephalic, atraumatic.  Sclera are noninjected.  Heart: HR normal. Lungs: Respiratory effort nonlabored Abd: Soft, NT, ND, +BS. Incision scars noted. Skin: Warm and dry. Psych: A&Ox3 with an appropriate affect.  Results for orders placed or performed during the hospital encounter of 03/25/24 (from the past 48 hours)  CBC with Differential     Status: None   Collection Time: 03/25/24  7:53 AM  Result Value Ref Range   WBC 8.1 4.0 - 10.5 K/uL   RBC 5.13 4.22 - 5.81 MIL/uL   Hemoglobin 16.9 13.0 - 17.0 g/dL   HCT 51.7 60.9 - 47.9 %   MCV 94.0 80.0 - 100.0 fL   MCH 32.9 26.0 - 34.0 pg   MCHC 35.1 30.0 - 36.0 g/dL  RDW 12.8 11.5 - 15.5 %   Platelets 189 150 - 400 K/uL   nRBC 0.0 0.0 - 0.2 %   Neutrophils Relative % 64 %   Neutro Abs 5.2 1.7 - 7.7 K/uL   Lymphocytes Relative 25 %   Lymphs Abs 2.0 0.7 - 4.0 K/uL   Monocytes Relative 8 %   Monocytes Absolute 0.6 0.1 - 1.0 K/uL   Eosinophils Relative 2 %   Eosinophils Absolute 0.2 0.0 - 0.5 K/uL   Basophils Relative 1 %   Basophils Absolute 0.1 0.0 - 0.1 K/uL   Immature Granulocytes 0 %   Abs Immature Granulocytes 0.02 0.00 - 0.07 K/uL    Comment: Performed at [facility], [address], [place], [place] [address]  Comprehensive metabolic panel     Status: Abnormal   Collection Time: 03/25/24  7:53 AM  Result Value Ref Range   Sodium 138 135 - 145 mmol/L   Potassium 4.1 3.5 - 5.1 mmol/L   Chloride 102 98 - 111 mmol/L   CO2 22 22 - 32 mmol/L   Glucose, Bld 198 (H) 70 - 99 mg/dL    Comment: Glucose reference range applies only to samples taken after fasting for at least 8 hours.   BUN 19 8 - 23 mg/dL   Creatinine, Ser 8.96 0.61 - 1.24 mg/dL   Calcium  10.0 8.9 - 10.3 mg/dL   Total Protein 7.9 6.5 - 8.1 g/dL   Albumin 4.3 3.5 - 5.0 g/dL    AST 26 15 - 41 U/L   ALT 41 0 - 44 U/L   Alkaline Phosphatase 106 38 - 126 U/L   Total Bilirubin 0.8 0.0 - 1.2 mg/dL   GFR, Estimated >39 >39 mL/min    Comment: (NOTE) Calculated using the CKD-EPI Creatinine Equation (2021)    Anion gap 15 5 - 15    Comment: Performed at [facility], [address] [place], [place] [address]  CBC     Status: None   Collection Time: 03/25/24 11:43 AM  Result Value Ref Range   WBC 8.3 4.0 - 10.5 K/uL   RBC 4.59 4.22 - 5.81 MIL/uL   Hemoglobin 15.0 13.0 - 17.0 g/dL   HCT 56.4 60.9 - 47.9 %   MCV 94.8 80.0 - 100.0 fL   MCH 32.7 26.0 - 34.0 pg   MCHC 34.5 30.0 - 36.0 g/dL   RDW 86.7 88.4 - 84.4 %   Platelets 198 150 - 400 K/uL   nRBC 0.0 0.0 - 0.2 %    Comment: Performed at [facility], [address] [place], [place] [address]   CT Angio Abd/Pel W and/or Wo Contrast Result Date: 03/25/2024 CLINICAL DATA:  Several episodes of bloody stools this morning. History of partial colectomy with reversal in August 2025. Evaluate for lower GI bleed. EXAM: CTA ABDOMEN AND PELVIS WITHOUT AND WITH CONTRAST TECHNIQUE: Multidetector CT imaging of the abdomen and pelvis was performed using the standard protocol during bolus administration of intravenous contrast. Multiplanar reconstructed images and MIPs were obtained and reviewed to evaluate the vascular anatomy. RADIATION DOSE REDUCTION: This exam was performed according to the departmental dose-optimization program which includes automated exposure control, adjustment of the mA and/or kV according to patient size and/or use of iterative reconstruction technique. CONTRAST:  OMNIPAQUE  IOHEXOL  350 MG/ML SOLN COMPARISON:  02/20/2023, 01/19/2023. FINDINGS: VASCULAR Aorta: Minimal calcified plaque over the descending thoracic aorta. Abdominal aorta is normal in caliber with minimal calcified plaque present. There  is no evidence of aneurysm or dissection. Celiac: Patent without  evidence of aneurysm, dissection, vasculitis or significant stenosis. SMA: Patent without evidence of aneurysm, dissection, vasculitis or significant stenosis. Renals: Both renal arteries are patent without evidence of aneurysm, dissection, vasculitis, fibromuscular dysplasia or significant stenosis. Small inferior accessory left renal arteries present. IMA: Patent without evidence of aneurysm, dissection, vasculitis or significant stenosis. Inflow: Patent without evidence of aneurysm, dissection, vasculitis or significant stenosis. Proximal Outflow: Bilateral common femoral and visualized portions of the superficial and profunda femoral arteries are patent without evidence of aneurysm, dissection, vasculitis or significant stenosis. Veins: No obvious venous abnormality within the limitations of this arterial phase study. Review of the MIP images confirms the above findings. NON-VASCULAR Lower chest: Heart is normal size. Calcified plaque over the right coronary artery and descending thoracic aorta. Lung bases are clear. Hepatobiliary: Liver, gallbladder and biliary tree are normal. Pancreas: Normal. Spleen: Normal. Adrenals/Urinary Tract: Adrenal glands are normal. Kidneys are normal in size without hydronephrosis or nephrolithiasis. No focal renal mass. Ureters and bladder are normal. Stomach/Bowel: Stomach and small bowel are normal. Appendix is normal. Evidence of patient's prior partial colectomy. Anastomotic site over the rectosigmoid colon is intact. No evidence of lower GI bleed. Lymphatic: No significant adenopathy. Reproductive: Prostate is unremarkable. Other: No free fluid or focal inflammatory change. Postsurgical changes over the anterior abdominal wall with diastasis of the rectus abdominus muscles at the level of the umbilicus. Musculoskeletal: Moderate degenerative changes of the spine. IMPRESSION: 1. No acute findings in the abdomen/pelvis. No evidence of lower GI bleed. 2. Evidence of patient's  prior partial colectomy. 3. Aortic atherosclerosis. Atherosclerotic coronary artery disease. Aortic Atherosclerosis (ICD10-I70.0). Electronically Signed   By: Toribio Agreste M.D.   On: 03/25/2024 13:20      Assessment/Plan Painless rectal bleeding S/p Laparotomy and hartmanns procedure 02/26/23 by Dr. Vernetta (Path without malagnancy).  Robotic takedown of colostomy, lysis of adhesions by Dr. Teresa 01/17/2024 -CTA abdomen and pelvis w/o and w contrast showed no acute findings in the abdomen/pelvis. No evidence of lower GI bleed. This was reviewed with attending.  -Afebrile. Vitals stable. -HGB WNL. -No abdominal tenderness. No n/v. -Recommend serial hemoglobin assessements. -Recommend GI consult to consider sigmoidoscopy vs endoscopy. -Keep NPO. -Will continue to follow up.    FEN - NPO; IVF per primary team VTE - Hold due to above. ID - None Admit to medicine service.  I reviewed ED provider notes, specialist notes, nursing notes, last 24 h vitals and pain scores, last 48 h intake and output, last 24 h labs and trends, and last 24 h imaging results.  Marjorie Favre, [facility] Surgery 03/25/2024, 2:05 PM Please see Amion for pager number during day hours 7:00am-4:30pm or 7:00am -11:30am on weekends

## 2024-03-25 NOTE — ED Notes (Addendum)
 Pt stated that he is becoming lightheaded upon lying and worsing upon standing... Pt also believes the blood in his stool getting brighter red and possibly clots.... Provider aware.SABRASABRA

## 2024-03-25 NOTE — Progress Notes (Signed)
 Plan of Care Note for accepted transfer   Patient: Joshua [name] MRN: [id number]   DOA: 03/25/2024  Facility requesting transfer: [facility]. Requesting Provider: Prentice Medicus, MD Reason for transfer: [facility]. Facility course:   68 year old male with past medical history of CVA on aspirin, diabetes, hyperlipidemia, and recurrent diverticulitis in the past with recent colostomy reversal on the 14th presenting to the emergency department today with a bloody bowel movement. The patient states that he woke up this morning and had what he thought was a normal bowel movement but noticed that there was significant bleeding. He denies any abdominal pain now. States that he did have some mild lightheadedness when this occurred but denies any currently. He reports that he has been doing well since the colostomy reversal on the 14th. He came to the ER today for further evaluation regarding this. He states that he did have a colonoscopy that showed diverticulosis but no other acute findings prior to reversal.   CBC [[id number]]   Collected: 03/25/24 1143   Updated: 03/25/24 1150   Specimen Type: Blood   Specimen Source: Vein    WBC 8.3 K/uL   RBC 4.59 MIL/uL   Hemoglobin 15.0 g/dL   HCT 56.4 %   MCV 05.1 fL   MCH 32.7 pg   MCHC 34.5 g/dL   RDW 86.7 %   Platelets 198 K/uL   nRBC 0.0 %  Comprehensive metabolic panel [[id number]] (Abnormal)   Collected: 03/25/24 0753   Updated: 03/25/24 0825   Specimen Type: Blood   Specimen Source: Vein    Sodium 138 mmol/L   Potassium 4.1 mmol/L   Chloride 102 mmol/L   CO2 22 mmol/L   Glucose, Bld 198 High  mg/dL   BUN 19 mg/dL   Creatinine, Ser 8.96 mg/dL   Calcium  10.0 mg/dL   Total Protein 7.9 g/dL   Albumin 4.3 g/dL   AST 26 U/L   ALT 41 U/L   Alkaline Phosphatase 106 U/L   Total Bilirubin 0.8 mg/dL   GFR, Estimated >39 mL/min   Anion gap 15  CBC with Differential [[id number]]   Collected: 03/25/24 0753   Updated: 03/25/24 0801   Specimen Type: Blood    Specimen Source: Vein    WBC 8.1 K/uL   RBC 5.13 MIL/uL   Hemoglobin 16.9 g/dL   HCT 51.7 %   MCV 05.9 fL   MCH 32.9 pg   MCHC 35.1 g/dL   RDW 87.1 %   Platelets 189 K/uL   nRBC 0.0 %   Neutrophils Relative % 64 %   Neutro Abs 5.2 K/uL   Lymphocytes Relative 25 %   Lymphs Abs 2.0 K/uL   Monocytes Relative 8 %   Monocytes Absolute 0.6 K/uL   Eosinophils Relative 2 %   Eosinophils Absolute 0.2 K/uL   Basophils Relative 1 %   Basophils Absolute 0.1 K/uL   Immature Granulocytes 0 %   Abs Immature Granulocytes 0.02 K/uL    Plan of care: The patient is accepted for admission to Progressive unit, at [facility].  Dr. Avram from [facility] GI and Dr. Teresa from general surgery were contacted.  Please notify GI of the patient's arrival to the hospital.  Author: Alm Dorn Castor, MD 03/25/2024  Check [email or web address] for on-call coverage.  Nursing staff, Please call [facility] Admits & Consults System-Wide number on Amion as soon as patient's arrival, so appropriate admitting provider can evaluate the pt.

## 2024-03-25 NOTE — ED Triage Notes (Signed)
 Reports large amount of blood in stool this morning. Denies ABD pain. Hx colostomy reversal in August. Hx of stroke.

## 2024-03-25 NOTE — Consult Note (Addendum)
 Referring Provider: EDP Primary Care Physician:  Micheal Wolm ORN, MD Primary Gastroenterologist:  Dr. Avram  Reason for Consultation:  GI bleed  HPI: Joshua [name] is a 68 y.o. male with history of psoriatic arthritis (on Enbrel), HTN, HLD, DM, bladder ca (2010), anal fistula, and diverticulitis.  He has a history recurrent diverticulitis and did not get elective surgery for this but ultimately presented with urgent need for laparotomy with partial colectomy and hartmanns procedure 02/26/23 (Dr. Vernetta) with colostomy. Path without malignancy. He had a stroke 05/2023 and is on ASA 81 mg only.  On 01/17/24 he underwent robotic takedown of colostomy, lysis of adhesions by Dr. Teresa. Just prior to the surgery in August he had colonoscopy as below.  Patient presented to [facility] emergency department this morning after having sudden onset of GI bleeding.  Says it started out of the blue at 6 AM.  He felt like he had have a bowel movement and thought it was diarrhea when it came out although that seemed unusual because he said he felt fine.  When he stood up and looked in the toilet bowl it was all blood.  They took a photo and showed watery red blood filling the toilet bowl.  They immediately proceeded to the emergency department.  Happened several more times the same way in the ER at [facility].  Had 1 episode here in the ED now at [facility] as well for a total of probably 7 or 8 episodes and it does not seem be slowing at this point..  Luckily his hemoglobin is still within normal range at 15 g.  BUN is normal.  He had 1 episode of getting somewhat lightheaded with the last bloody bowel movement, but otherwise has not felt dizzy, lightheaded, etc.  No abdominal pain, no nausea or vomiting.  Says that he was moving his bowels great leading up to this without straining, etc. He took 1 Motrin  last week, but otherwise only his daily ASA and no other NSAIDs.  CT angio abdomen pelvis as below with  no evidence of acute bleeding.  IMPRESSION: 1. No acute findings in the abdomen/pelvis. No evidence of lower GI bleed. 2. Evidence of patient's prior partial colectomy. 3. Aortic atherosclerosis. Atherosclerotic coronary artery disease.   Aortic Atherosclerosis (ICD10-I70.0).   Colonoscopy 01/2024:  - Patent end sigmoid colostomy. - Two diminutive polyps in the transverse colon and in the cecum, removed with a cold snare. Resected and retrieved. - Diverticulosis in the sigmoid colon, in the descending colon and in the transverse colon. - The examination was otherwise normal. Examined rectal pouch but limited views due to retained mucous   1. Surgical [P], colon, transverse and cecum, polyp (2) :       - POLYPOID COLONIC MUCOSA WITH MILD HYPERPLASTIC CHANGES       - NEGATIVE FOR DYSPLASIA       - MULTIPLE STEP SECTIONS WERE EXAMINED    Past Medical History:  Diagnosis Date   Anal fistula    Chronic kidney disease    Hidradenitis    History of bladder cancer 2009   urologist-- dr rosalind---  s/p TURBT 2010,  recurrence in office 2014 ,  none since per pt   History of colonic polyps    History of COVID-19 04/2019   per pt had covid pneumonia recovered at home, no oxygen, symptoms resolved   History of diverticulitis of colon    History of hypertension 04/05/2009   per the pt he  lost 55 lbs and htn has been reversed   History of kidney stones    Hydradenitis    Hyperlipidemia 04/05/2009   pt states he does not take med for cholesterol   Hypertension    OA (osteoarthritis) 04/05/2009   Pneumonia    Psoriatic arthritis (HCC)    rheumonotologist--- dr ronal jacob   Stroke [facility])    2024   Type 2 diabetes mellitus (HCC) 04/05/2009   followed by pcp---  (01-31-2021  per pt only checks blood sugar once weekly, last fasting sugar-- 122)    Past Surgical History:  Procedure Laterality Date   COLONOSCOPY     last one 11-04-2020  by gessner   CYSTOSCOPY WITH INDOCYANINE GREEN   IMAGING (ICG) N/A 01/17/2024   Procedure: CYSTOSCOPY WITH INDOCYANINE GREEN  IMAGING (ICG);  Surgeon: Shane Steffan BROCKS, MD;  Location: [facility];  Service: Urology;  Laterality: N/A;  FIREFLYER   EXTRACORPOREAL SHOCK WAVE LITHOTRIPSY  2015   FLEXIBLE SIGMOIDOSCOPY N/A 01/17/2024   Procedure: KINGSTON SIDE;  Surgeon: Teresa Lonni HERO, MD;  Location: [facility];  Service: General;  Laterality: N/A;   INCISION AND DRAINAGE ABSCESS N/A 02/03/2021   Procedure: EXCISION OF PERIANAL AND PERINEAL HYDRADENITIS, INTERROGATION OF PERIANAL WOUNDS;  Surgeon: Teresa Lonni HERO, MD;  Location: [facility];  Service: General;  Laterality: N/A;   LAPAROSCOPIC LYSIS OF ADHESIONS N/A 01/17/2024   Procedure: LYSIS, ADHESIONS, LAPAROSCOPIC;  Surgeon: Teresa Lonni HERO, MD;  Location: [facility];  Service: General;  Laterality: N/A;   PARTIAL COLECTOMY N/A 02/26/2023   Procedure: EXPLORATORY LAPAROTOMY; PARTIAL COLECTOMY; COLOSTOMY;  Surgeon: Vernetta Berg, MD;  Location: [facility];  Service: General;  Laterality: N/A;   RECTAL EXAM UNDER ANESTHESIA  02/03/2021   Procedure: ANORECTAL EXAM UNDER ANESTHESIA;  Surgeon: Teresa Lonni HERO, MD;  Location: [facility];  Service: General;;   TONSILLECTOMY     child   TRANSURETHRAL RESECTION OF BLADDER TUMOR  02/2008   XI ROBOTIC ASSISTED COLOSTOMY TAKEDOWN N/A 01/17/2024   Procedure: CLOSURE, COLOSTOMY, ROBOT-ASSISTED;  Surgeon: Teresa Lonni HERO, MD;  Location: [facility];  Service: General;  Laterality: N/A;    Prior to Admission medications   Medication Sig Start Date End Date Taking? Authorizing Provider  acetaminophen  (TYLENOL ) 500 MG tablet Take 2 tablets (1,000 mg total) by mouth every 6 (six) hours as needed. 03/02/23   Augustus Almarie RAMAN, PA-C  aspirin 81 MG chewable tablet Chew 81 mg by mouth daily.    [provider]  BAYER MICROLET LANCETS lancets Check Blood Sugar Once Daily. 02/21/16   Burchette, Wolm ORN, MD   betamethasone  dipropionate 0.05 % lotion Apply 1 Application topically daily as needed (irritation). 03/26/23   [provider]  CINNAMON PO Take 1 capsule by mouth daily.    [provider]  clindamycin  (CLEOCIN  T) 1 % lotion Apply 1 application  topically daily as needed (irritation). 03/07/21   [provider]  COSENTYX SENSOREADY, 300 MG, 150 MG/ML SOAJ Inject 300 mg into the skin every 28 (twenty-eight) days.    [provider]  empagliflozin  (JARDIANCE ) 25 MG TABS tablet Take 1 tablet (25 mg total) by mouth daily before breakfast. 02/06/24   Burchette, Wolm ORN, MD  ONETOUCH ULTRA test strip USE AS DIRECTED TO CHECK BLOOD GLUCOSE ONE TIME DAILY 08/29/19   Burchette, Wolm ORN, MD  Pediatric Multivitamins-Fl (MULTIVITAMIN DROPS/FLUORIDE PO) Take 1 tablet by mouth daily.    [provider]  polyethylene glycol (MIRALAX  / GLYCOLAX )  17 g packet Take 17 g by mouth daily as needed for mild constipation. 03/02/23   Augustus Almarie RAMAN, PA-C  rosuvastatin  (CRESTOR ) 10 MG tablet TAKE 1 TABLET(10 MG) BY MOUTH DAILY 01/21/24   Burchette, Wolm ORN, MD  triamcinolone  cream (KENALOG ) 0.1 % Apply 1 Application topically 2 (two) times daily as needed (irritation). 12/29/22   [provider]    Current Facility-Administered Medications  Medication Dose Route Frequency Provider Last Rate Last Admin   pantoprazole (PROTONIX) injection 80 mg  80 mg Intravenous Once Tee, Andrew R, MD       Current Outpatient Medications  Medication Sig Dispense Refill   acetaminophen  (TYLENOL ) 500 MG tablet Take 2 tablets (1,000 mg total) by mouth every 6 (six) hours as needed. 30 tablet 0   aspirin 81 MG chewable tablet Chew 81 mg by mouth daily.     BAYER MICROLET LANCETS lancets Check Blood Sugar Once Daily. 30 each 5   betamethasone  dipropionate 0.05 % lotion Apply 1 Application topically daily as needed (irritation).     CINNAMON PO Take 1 capsule by mouth daily.      clindamycin  (CLEOCIN  T) 1 % lotion Apply 1 application  topically daily as needed (irritation).     COSENTYX SENSOREADY, 300 MG, 150 MG/ML SOAJ Inject 300 mg into the skin every 28 (twenty-eight) days.     empagliflozin  (JARDIANCE ) 25 MG TABS tablet Take 1 tablet (25 mg total) by mouth daily before breakfast. 90 tablet 3   ONETOUCH ULTRA test strip USE AS DIRECTED TO CHECK BLOOD GLUCOSE ONE TIME DAILY 100 strip 10   Pediatric Multivitamins-Fl (MULTIVITAMIN DROPS/FLUORIDE PO) Take 1 tablet by mouth daily.     polyethylene glycol (MIRALAX  / GLYCOLAX ) 17 g packet Take 17 g by mouth daily as needed for mild constipation. 14 each 0   rosuvastatin  (CRESTOR ) 10 MG tablet TAKE 1 TABLET(10 MG) BY MOUTH DAILY 90 tablet 1   triamcinolone  cream (KENALOG ) 0.1 % Apply 1 Application topically 2 (two) times daily as needed (irritation).      Allergies as of 03/25/2024 - Review Complete 03/25/2024  Allergen Reaction Noted   Penicillin g Hives 09/08/2020   Penicillins Hives 04/05/2009   Adalimumab Other (See Comments) 09/08/2020   Metformin  and related Other (See Comments) 10/12/2023    Family History  Problem Relation Age of Onset   Diabetes Mother    Hypertension Mother    Arthritis Father    Crohn's disease Daughter    Esophageal cancer Neg Hx    Colon cancer Neg Hx    Pancreatic cancer Neg Hx    Stomach cancer Neg Hx    Colon polyps Neg Hx    Rectal cancer Neg Hx     Social History   Socioeconomic History   Marital status: Married    Spouse name: Not on file   Number of children: Not on file   Years of education: Not on file   Highest education level: Bachelor's degree (e.g., BA, AB, BS)  Occupational History   Not on file  Tobacco Use   Smoking status: Some Days    Types: Cigarettes, Cigars   Smokeless tobacco: Never   Tobacco comments:    01-31-2021  per pt quit cigarettes 1981 (age 77) for 8 yrs;  since then has smoked cigar's socially  Vaping Use   Vaping status: Never Used   Substance and Sexual Activity   Alcohol use: Yes    Alcohol/week: 14.0 standard drinks of alcohol  Types: 14 Cans of beer per week   Drug use: No   Sexual activity: Not on file  Other Topics Concern   Not on file  Social History Narrative   Patient is married, a daughter has Crohn's disease   No alcohol or drug use he is an intermittent smoker of cigarettes and cigars   Social Drivers of Corporate investment banker Strain: Low Risk  (07/10/2023)   Overall Financial Resource Strain (CARDIA)    Difficulty of Paying Living Expenses: Not hard at all  Food Insecurity: No Food Insecurity (01/17/2024)   Hunger Vital Sign    Worried About Running Out of Food in the Last Year: Never true    Ran Out of Food in the Last Year: Never true  Transportation Needs: No Transportation Needs (01/17/2024)   PRAPARE - Administrator, Civil Service (Medical): No    Lack of Transportation (Non-Medical): No  Physical Activity: Insufficiently Active (07/10/2023)   Exercise Vital Sign    Days of Exercise per Week: 4 days    Minutes of Exercise per Session: 30 min  Stress: No Stress Concern Present (07/10/2023)   [employer] of Occupational Health - Occupational Stress Questionnaire    Feeling of Stress : Not at all  Social Connections: Socially Integrated (01/17/2024)   Social Connection and Isolation Panel    Frequency of Communication with Friends and Family: Twice a week    Frequency of Social Gatherings with Friends and Family: Once a week    Attends Religious Services: More than 4 times per year    Active Member of [employer] or Organizations: Yes    Attends Engineer, structural: More than 4 times per year    Marital Status: Married  Catering manager Violence: Not At Risk (01/17/2024)   Humiliation, Afraid, Rape, and Kick questionnaire    Fear of Current or Ex-Partner: No    Emotionally Abused: No    Physically Abused: No    Sexually Abused: No    Review of Systems: ROS is  O/W negative except as mentioned in HPI.  Physical Exam: Vital signs in last 24 hours: Temp:  [98.2 F (36.8 C)-98.7 F (37.1 C)] 98.2 F (36.8 C) (10/21 1138) Pulse Rate:  [78-97] 95 (10/21 1330) Resp:  [14-21] 17 (10/21 1330) BP: (112-158)/(66-92) 133/83 (10/21 1330) SpO2:  [100 %] 100 % (10/21 1330)   General:  Alert, Well-developed, well-nourished, pleasant and cooperative in NAD Head:  Normocephalic and atraumatic. Eyes:  Sclera clear, no icterus.  Conjunctiva pink. Ears:  Normal auditory acuity. Mouth:  No deformity or lesions.   Lungs:  Clear throughout to auscultation.  No wheezes, crackles, or rhonchi.  Heart:  Regular rate and rhythm; no murmurs, clicks, rubs, or gallops. Abdomen:  Soft, non-distended.  BS present.  Scars noted from previous surgeries and look well-healed.  Non-tender. Msk:  Symmetrical without gross deformities. Pulses:  Normal pulses noted. Extremities:  Without clubbing or edema. Neurologic:  Alert and oriented x 4;  grossly normal neurologically. Skin:  Intact without significant lesions or rashes. Psych:  Alert and cooperative. Normal mood and affect.  Intake/Output this shift: Total I/O In: 1000 [IV Piggyback:1000] Out: -   Lab Results: Recent Labs    03/25/24 0753 03/25/24 1143  WBC 8.1 8.3  HGB 16.9 15.0  HCT 48.2 43.5  PLT 189 198   BMET Recent Labs    03/25/24 0753  NA 138  K 4.1  CL 102  CO2 22  GLUCOSE 198*  BUN 19  CREATININE 1.03  CALCIUM  10.0   LFT Recent Labs    03/25/24 0753  PROT 7.9  ALBUMIN 4.3  AST 26  ALT 41  ALKPHOS 106  BILITOT 0.8   Studies/Results: CT Angio Abd/Pel W and/or Wo Contrast Result Date: 03/25/2024 CLINICAL DATA:  Several episodes of bloody stools this morning. History of partial colectomy with reversal in August 2025. Evaluate for lower GI bleed. EXAM: CTA ABDOMEN AND PELVIS WITHOUT AND WITH CONTRAST TECHNIQUE: Multidetector CT imaging of the abdomen and pelvis was performed using  the standard protocol during bolus administration of intravenous contrast. Multiplanar reconstructed images and MIPs were obtained and reviewed to evaluate the vascular anatomy. RADIATION DOSE REDUCTION: This exam was performed according to the departmental dose-optimization program which includes automated exposure control, adjustment of the mA and/or kV according to patient size and/or use of iterative reconstruction technique. CONTRAST:  OMNIPAQUE  IOHEXOL  350 MG/ML SOLN COMPARISON:  02/20/2023, 01/19/2023. FINDINGS: VASCULAR Aorta: Minimal calcified plaque over the descending thoracic aorta. Abdominal aorta is normal in caliber with minimal calcified plaque present. There is no evidence of aneurysm or dissection. Celiac: Patent without evidence of aneurysm, dissection, vasculitis or significant stenosis. SMA: Patent without evidence of aneurysm, dissection, vasculitis or significant stenosis. Renals: Both renal arteries are patent without evidence of aneurysm, dissection, vasculitis, fibromuscular dysplasia or significant stenosis. Small inferior accessory left renal arteries present. IMA: Patent without evidence of aneurysm, dissection, vasculitis or significant stenosis. Inflow: Patent without evidence of aneurysm, dissection, vasculitis or significant stenosis. Proximal Outflow: Bilateral common femoral and visualized portions of the superficial and profunda femoral arteries are patent without evidence of aneurysm, dissection, vasculitis or significant stenosis. Veins: No obvious venous abnormality within the limitations of this arterial phase study. Review of the MIP images confirms the above findings. NON-VASCULAR Lower chest: Heart is normal size. Calcified plaque over the right coronary artery and descending thoracic aorta. Lung bases are clear. Hepatobiliary: Liver, gallbladder and biliary tree are normal. Pancreas: Normal. Spleen: Normal. Adrenals/Urinary Tract: Adrenal glands are normal. Kidneys  are normal in size without hydronephrosis or nephrolithiasis. No focal renal mass. Ureters and bladder are normal. Stomach/Bowel: Stomach and small bowel are normal. Appendix is normal. Evidence of patient's prior partial colectomy. Anastomotic site over the rectosigmoid colon is intact. No evidence of lower GI bleed. Lymphatic: No significant adenopathy. Reproductive: Prostate is unremarkable. Other: No free fluid or focal inflammatory change. Postsurgical changes over the anterior abdominal wall with diastasis of the rectus abdominus muscles at the level of the umbilicus. Musculoskeletal: Moderate degenerative changes of the spine. IMPRESSION: 1. No acute findings in the abdomen/pelvis. No evidence of lower GI bleed. 2. Evidence of patient's prior partial colectomy. 3. Aortic atherosclerosis. Atherosclerotic coronary artery disease. Aortic Atherosclerosis (ICD10-I70.0). Electronically Signed   By: Toribio Agreste M.D.   On: 03/25/2024 13:20   IMPRESSION:  *GI bleed in a patient with colostomy takedown/reversal in August.  Also had diverticulosis on recent colonoscopy in August.  Bleeding possibly diverticular versus from anastomosis site.  CTA negative for bleeding source.  Hemoglobin normal at 15 g at this point.  BUN is normal arguing against upper GI source.  Also, no NSAID use except 1 motrin  last week.   *Recent complaints of dysphagia to pills and some solid food as well that has been occurring for a while and he meant to address it with Dr. Avram previously. *History of CVA in December 2024 on ASA 81 mg only  PLAN: -  Observe the patient and monitor hemoglobin. - If bleeding continues to significant degree need to consider repeat CTA. - Otherwise we will reassess the patient in the morning.  I am going to leave him n.p.o. in case of need for flexible sigmoidoscopy to assess the area of anastomosis.  Would just plan to do this unprepped.  Can consider endoscopy as well although I think upper GI  bleed is very much less likely.  Harlene BIRCH. Zehr  03/25/2024, 2:53 PM     Attending Physician's Attestation   I have taken an interval history, reviewed the chart and examined the patient.   This is a patient that the GI service is asked to evaluate in the setting of rectal bleeding and acute blood loss anemia.  Patient with history of known diverticular disease who had within the last 2 months a colostomy takedown with LOA and colon reanastomosis after Hartman's procedure.  He came in for further evaluation with bright red blood per rectum that was painless.  This continue to occur.  Due to the progressive nature of things he was seen in the emergency department at [facility].  Cross-sectional imaging performed showed no evidence of acute bleed.  He continued to have issues and was sent from [facility] to [facility] for admission.  He just had a colonoscopy in August with: Diverticulosis of the sigmoid/descending/transverse colon.  No evidence of any other masses or lesions were found.  It is after that that he underwent his Hetty reversal.  On exam he is hemodynamically stable.  Though since being in the emergency department he has had multiple bowel movements with bright red blood.  Most recent hemogram was still in the normal range.  He is not tachycardic at this time.  I suspect that he will drop his hemoglobin over the course of tonight into tomorrow.  He does not have overt reasoning to have a source for an upper GI bleed but has had some dysphagia symptoms that have been mild and may require an endoscopic evaluation at some point.  If he continues to have progressive issues, the etiology and differential diagnosis of his bleeding most likely would be diverticular though anastomotic bleeding for some reason should also be considered.  Since his anastomosis would be in the left colon we could evaluate this with a sigmoidoscopy.  If a sigmoidoscopy is required, we can consider a diagnostic  endoscopy for evaluation of his dysphagia symptoms.  I think it is okay to have him on IV PPI, but his BUN is normal and this is bright red blood without hemodynamic compromise, so gastric ulceration or duodenal ulceration seems less likely but we can have this overnight for safety purposes.  Will reevaluate where things are tomorrow with the hope that things stabilize.  If he has progressive bright red blood per rectum on multiple other occasions overnight, I recommend a repeat CT angiography GI bleed scan.  Our team will see him in follow-up tomorrow.  Please call inpatient GI coverage if necessary overnight after CT angiography has been completed.  Hopefully he will not need any endoscopic evaluation but we will try to get a spot in case needed for tomorrow.  This plan has been agreed upon with the patient.  I agree with the Advanced Practitioner's note, impression, and recommendations with updates and my documentation as noted above.  The majority of the medical decision making/process, formulation of the impression/plan of action for the patient were performed by me with substantive portion of this encounter (>  50% time spent including complete performance of at least one of the key components of MDM, History, and/or Exam).   Aloha Finner, MD [facility] Advanced [facility]oscopy Office # [phone]

## 2024-03-25 NOTE — H&P (Signed)
 History and Physical    Patient: Joshua [name] FMW:[id number] DOB: 30-Jun-1955 DOA: 03/25/2024 DOS: the patient was seen and examined on 03/25/2024 PCP: Micheal Wolm ORN, MD  Patient coming from: Home  Chief Complaint:  Chief Complaint  Patient presents with   Rectal Bleeding   HPI: Joshua [name] is a 68 y.o. male with medical history significant for psoriatic arthritis (previously on Enbrel), perianal hidradenitis, type 2 diabetes mellitus, hypertension, and hyperlipidemia He is status post Hartman's procedure September 2024 for chronic diverticulitis.  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 Since recovering from the surgery patient reports he was doing very well.  He was having regular bowel movements without discomfort.  This morning at about 6:45 AM he went to the bathroom and had had what felt like diarrhea.  There was a big gush from his rectum.  When he got up to wipe he saw that it was all bright red blood.  He and his wife went to the ED for evaluation.  The patient has had a total of 7 additional bloody bowel movements in the ED since this morning. Denies any pain or nausea, chest pain or fevers. With the last bowel movement however he did feel a little short of breath and very flushed maybe a little faint.  His evaluation in the emergency department included blood work.  His initial hemoglobin was 16.9 later repeated his hemoglobin was 15.0.  He will be admitted to the hospitalist service. Gastroenterology and general surgery have been consulted.   Review of Systems: As mentioned in the history of present illness. All other systems reviewed and are negative. Past Medical History:  Diagnosis Date   Anal fistula    Chronic kidney disease    Hidradenitis    History of bladder cancer 2009   urologist-- dr rosalind---  s/p TURBT 2010,  recurrence in office 2014 ,  none since per pt   History of colonic  polyps    History of COVID-19 04/2019   per pt had covid pneumonia recovered at home, no oxygen, symptoms resolved   History of diverticulitis of colon    History of hypertension 04/05/2009   per the pt he lost 55 lbs and htn has been reversed   History of kidney stones    Hydradenitis    Hyperlipidemia 04/05/2009   pt states he does not take med for cholesterol   Hypertension    OA (osteoarthritis) 04/05/2009   Pneumonia    Psoriatic arthritis (HCC)    rheumonotologist--- dr ronal jacob   Stroke [facility])    2024   Type 2 diabetes mellitus (HCC) 04/05/2009   followed by pcp---  (01-31-2021  per pt only checks blood sugar once weekly, last fasting sugar-- 122)   Past Surgical History:  Procedure Laterality Date   COLONOSCOPY     last one 11-04-2020  by gessner   CYSTOSCOPY WITH INDOCYANINE GREEN  IMAGING (ICG) N/A 01/17/2024   Procedure: CYSTOSCOPY WITH INDOCYANINE GREEN  IMAGING (ICG);  Surgeon: Shane Steffan BROCKS, MD;  Location: [facility];  Service: Urology;  Laterality: N/A;  FIREFLYER   EXTRACORPOREAL SHOCK WAVE LITHOTRIPSY  2015   FLEXIBLE SIGMOIDOSCOPY N/A 01/17/2024   Procedure: KINGSTON SIDE;  Surgeon: Teresa Lonni HERO, MD;  Location: [facility];  Service: General;  Laterality: N/A;   INCISION AND DRAINAGE ABSCESS N/A 02/03/2021   Procedure: EXCISION OF PERIANAL AND PERINEAL HYDRADENITIS, INTERROGATION OF PERIANAL  WOUNDS;  Surgeon: Teresa Lonni HERO, MD;  Location: [facility];  Service: General;  Laterality: N/A;   LAPAROSCOPIC LYSIS OF ADHESIONS N/A 01/17/2024   Procedure: LYSIS, ADHESIONS, LAPAROSCOPIC;  Surgeon: Teresa Lonni HERO, MD;  Location: [facility];  Service: General;  Laterality: N/A;   PARTIAL COLECTOMY N/A 02/26/2023   Procedure: EXPLORATORY LAPAROTOMY; PARTIAL COLECTOMY; COLOSTOMY;  Surgeon: Vernetta Berg, MD;  Location: [facility];  Service: General;  Laterality: N/A;   RECTAL EXAM UNDER ANESTHESIA  02/03/2021   Procedure: ANORECTAL EXAM UNDER  ANESTHESIA;  Surgeon: Teresa Lonni HERO, MD;  Location: [facility] [place];  Service: General;;   TONSILLECTOMY     child   TRANSURETHRAL RESECTION OF BLADDER TUMOR  02/2008   XI ROBOTIC ASSISTED COLOSTOMY TAKEDOWN N/A 01/17/2024   Procedure: CLOSURE, COLOSTOMY, ROBOT-ASSISTED;  Surgeon: Teresa Lonni HERO, MD;  Location: [facility];  Service: General;  Laterality: N/A;   Social History:  reports that he has been smoking cigarettes and cigars. He has never used smokeless tobacco. He reports current alcohol use of about 14.0 standard drinks of alcohol per week. He reports that he does not use drugs.  Allergies  Allergen Reactions   Penicillin G Hives   Penicillins Hives    And fever blisters   Adalimumab Other (See Comments)    Wasn't effective   Metformin  And Related Other (See Comments)    Upset stomach    Family History  Problem Relation Age of Onset   Diabetes Mother    Hypertension Mother    Arthritis Father    Crohn's disease Daughter    Esophageal cancer Neg Hx    Colon cancer Neg Hx    Pancreatic cancer Neg Hx    Stomach cancer Neg Hx    Colon polyps Neg Hx    Rectal cancer Neg Hx     Prior to Admission medications   Medication Sig Start Date End Date Taking? Authorizing Provider  acetaminophen  (TYLENOL ) 500 MG tablet Take 2 tablets (1,000 mg total) by mouth every 6 (six) hours as needed. 03/02/23   Augustus Almarie RAMAN, PA-C  aspirin 81 MG chewable tablet Chew 81 mg by mouth daily.    [provider]  BAYER MICROLET LANCETS lancets Check Blood Sugar Once Daily. 02/21/16   Burchette, Wolm ORN, MD  betamethasone  dipropionate 0.05 % lotion Apply 1 Application topically daily as needed (irritation). 03/26/23   [provider]  CINNAMON PO Take 1 capsule by mouth daily.    [provider]  clindamycin  (CLEOCIN  T) 1 % lotion Apply 1 application  topically daily as needed (irritation). 03/07/21   [provider]  COSENTYX SENSOREADY,  300 MG, 150 MG/ML SOAJ Inject 300 mg into the skin every 28 (twenty-eight) days.    [provider]  empagliflozin  (JARDIANCE ) 25 MG TABS tablet Take 1 tablet (25 mg total) by mouth daily before breakfast. 02/06/24   Burchette, Wolm ORN, MD  ONETOUCH ULTRA test strip USE AS DIRECTED TO CHECK BLOOD GLUCOSE ONE TIME DAILY 08/29/19   Burchette, Wolm ORN, MD  Pediatric Multivitamins-Fl (MULTIVITAMIN DROPS/FLUORIDE PO) Take 1 tablet by mouth daily.    [provider]  polyethylene glycol (MIRALAX  / GLYCOLAX ) 17 g packet Take 17 g by mouth daily as needed for mild constipation. 03/02/23   Augustus Almarie RAMAN, PA-C  rosuvastatin  (CRESTOR ) 10 MG tablet TAKE 1 TABLET(10 MG) BY MOUTH DAILY 01/21/24   Burchette, Wolm ORN, MD  triamcinolone  cream (KENALOG ) 0.1 % Apply 1 Application  topically 2 (two) times daily as needed (irritation). 12/29/22   [provider]    Physical Exam: Vitals:   03/25/24 1200 03/25/24 1230 03/25/24 1300 03/25/24 1330  BP: 121/66 112/70 127/72 133/83  Pulse: 80 78 84 95  Resp: 14 14 16 17   Temp:      TempSrc:      SpO2: 100% 100% 100% 100%   Physical Exam:  General: No acute distress, well developed, well nourished HEENT: Normocephalic, atraumatic, PERRL Cardiovascular: Normal rate and rhythm. Distal pulses intact. Pulmonary: Normal pulmonary effort, normal breath sounds Gastrointestinal: Nondistended abdomen, soft, non-tender, normoactive bowel sounds Musculoskeletal:Normal ROM, no lower ext edema Lymphadenopathy: No cervical LAD. Skin: Skin is warm and dry. Old colostomy site well healed Neuro: No focal deficits noted, AAOx3. PSYCH: Attentive and cooperative  Data Reviewed:  Results for orders placed or performed during the hospital encounter of 03/25/24 (from the past 24 hours)  CBC with Differential     Status: None   Collection Time: 03/25/24  7:53 AM  Result Value Ref Range   WBC 8.1 4.0 - 10.5 K/uL   RBC 5.13 4.22 - 5.81 MIL/uL    Hemoglobin 16.9 13.0 - 17.0 g/dL   HCT 51.7 60.9 - 47.9 %   MCV 94.0 80.0 - 100.0 fL   MCH 32.9 26.0 - 34.0 pg   MCHC 35.1 30.0 - 36.0 g/dL   RDW 87.1 88.4 - 84.4 %   Platelets 189 150 - 400 K/uL   nRBC 0.0 0.0 - 0.2 %   Neutrophils Relative % 64 %   Neutro Abs 5.2 1.7 - 7.7 K/uL   Lymphocytes Relative 25 %   Lymphs Abs 2.0 0.7 - 4.0 K/uL   Monocytes Relative 8 %   Monocytes Absolute 0.6 0.1 - 1.0 K/uL   Eosinophils Relative 2 %   Eosinophils Absolute 0.2 0.0 - 0.5 K/uL   Basophils Relative 1 %   Basophils Absolute 0.1 0.0 - 0.1 K/uL   Immature Granulocytes 0 %   Abs Immature Granulocytes 0.02 0.00 - 0.07 K/uL  Comprehensive metabolic panel     Status: Abnormal   Collection Time: 03/25/24  7:53 AM  Result Value Ref Range   Sodium 138 135 - 145 mmol/L   Potassium 4.1 3.5 - 5.1 mmol/L   Chloride 102 98 - 111 mmol/L   CO2 22 22 - 32 mmol/L   Glucose, Bld 198 (H) 70 - 99 mg/dL   BUN 19 8 - 23 mg/dL   Creatinine, Ser 8.96 0.61 - 1.24 mg/dL   Calcium  10.0 8.9 - 10.3 mg/dL   Total Protein 7.9 6.5 - 8.1 g/dL   Albumin 4.3 3.5 - 5.0 g/dL   AST 26 15 - 41 U/L   ALT 41 0 - 44 U/L   Alkaline Phosphatase 106 38 - 126 U/L   Total Bilirubin 0.8 0.0 - 1.2 mg/dL   GFR, Estimated >39 >39 mL/min   Anion gap 15 5 - 15  CBC     Status: None   Collection Time: 03/25/24 11:43 AM  Result Value Ref Range   WBC 8.3 4.0 - 10.5 K/uL   RBC 4.59 4.22 - 5.81 MIL/uL   Hemoglobin 15.0 13.0 - 17.0 g/dL   HCT 56.4 60.9 - 47.9 %   MCV 94.8 80.0 - 100.0 fL   MCH 32.7 26.0 - 34.0 pg   MCHC 34.5 30.0 - 36.0 g/dL   RDW 86.7 88.4 - 84.4 %   Platelets 198 150 -  400 K/uL   nRBC 0.0 0.0 - 0.2 %     Assessment and Plan: Lower GI Bleed -  - Clear liquids this afternoon.  N.p.o. after midnight - IV fluids - Monitor hemoglobin Q6 - Appreciate GI and general surgery consultation - Hold daily aspirin - Empiric Protonix though this is likely a lower GI bleed.  2. DMT2 -holding Jardiance  while the  patient is NPO.  Monitor blood sugars and corrective dose insulin  as needed  3. H/o CVA - Resume aspirin as soon as possible.   Advance Care Planning:   Code Status: Prior  The patient names his wife as a surrogate decision maker and wants to be full code.   Consults: Margarete Berke, gen surgery  Family Communication: Wife and pastor at bedside  Severity of Illness: The appropriate patient status for this patient is OBSERVATION. Observation status is judged to be reasonable and necessary in order to provide the required intensity of service to ensure the patient's safety. The patient's presenting symptoms, physical exam findings, and initial radiographic and laboratory data in the context of their medical condition is felt to place them at decreased risk for further clinical deterioration. Furthermore, it is anticipated that the patient will be medically stable for discharge from the hospital within 2 midnights of admission.   Author: ARTHEA CHILD, MD 03/25/2024 3:30 PM  For on call review [email or web address].

## 2024-03-25 NOTE — ED Notes (Signed)
 Pt went to the restroom... During episode, appeared to only be blood in the stool, possible clots... V/S - Very SOB, HR 120, RR 26, BP 126/70, SpO2 98... Provider aware.SABRASABRA

## 2024-03-26 ENCOUNTER — Encounter (HOSPITAL_COMMUNITY): Admission: EM | Disposition: A | Discharge status: Home / Self Care | Payer: Self-pay | Source: Home / Self Care

## 2024-03-26 ENCOUNTER — Encounter (HOSPITAL_COMMUNITY): Payer: Self-pay | Admitting: Certified Registered Nurse Anesthetist

## 2024-03-26 DIAGNOSIS — K922 Gastrointestinal hemorrhage, unspecified: Secondary | ICD-10-CM | POA: Diagnosis not present

## 2024-03-26 DIAGNOSIS — K5731 Diverticulosis of large intestine without perforation or abscess with bleeding: Secondary | ICD-10-CM | POA: Diagnosis not present

## 2024-03-26 DIAGNOSIS — K625 Hemorrhage of anus and rectum: Secondary | ICD-10-CM | POA: Diagnosis not present

## 2024-03-26 DIAGNOSIS — Z9889 Other specified postprocedural states: Secondary | ICD-10-CM | POA: Diagnosis not present

## 2024-03-26 LAB — BASIC METABOLIC PANEL WITH GFR
Anion gap: 12 (ref 5–15)
BUN: 19 mg/dL (ref 8–23)
CO2: 21 mmol/L — ABNORMAL LOW (ref 22–32)
Calcium: 8.5 mg/dL — ABNORMAL LOW (ref 8.9–10.3)
Chloride: 103 mmol/L (ref 98–111)
Creatinine, Ser: 0.91 mg/dL (ref 0.61–1.24)
GFR, Estimated: >60 mL/min (ref >60)
Glucose, Bld: 115 mg/dL — ABNORMAL HIGH (ref 70–99)
Potassium: 4.2 mmol/L (ref 3.5–5.1)
Sodium: 136 mmol/L (ref 135–145)

## 2024-03-26 LAB — CBC
HCT: 32.3 % — ABNORMAL LOW (ref 39.0–52.0)
Hemoglobin: 10.5 g/dL — ABNORMAL LOW (ref 13.0–17.0)
MCH: 31.6 pg (ref 26.0–34.0)
MCHC: 32.5 g/dL (ref 30.0–36.0)
MCV: 97.3 fL (ref 80.0–100.0)
Platelets: 176 K/uL (ref 150–400)
RBC: 3.32 MIL/uL — ABNORMAL LOW (ref 4.22–5.81)
RDW: 13.2 % (ref 11.5–15.5)
WBC: 6.8 K/uL (ref 4.0–10.5)
nRBC: 0 % (ref 0.0–0.2)

## 2024-03-26 LAB — GLUCOSE, CAPILLARY
Glucose-Capillary: 117 mg/dL — ABNORMAL HIGH (ref 70–99)
Glucose-Capillary: 219 mg/dL — ABNORMAL HIGH (ref 70–99)
Glucose-Capillary: 192 mg/dL — ABNORMAL HIGH (ref 70–99)
Glucose-Capillary: 206 mg/dL — ABNORMAL HIGH (ref 70–99)

## 2024-03-26 LAB — HIV ANTIBODY (ROUTINE TESTING W REFLEX): HIV Screen 4th Generation wRfx: NONREACTIVE

## 2024-03-26 SURGERY — SIGMOIDOSCOPY, FLEXIBLE
Anesthesia: Monitor Anesthesia Care

## 2024-03-26 NOTE — Progress Notes (Cosign Needed)
 [facility] Progress Note  CC:   GI bleed   Subjective:  No bleeding since 7 PM last night.  He is starving.  Objective:  Vital signs in last 24 hours: Temp:  [98.2 F (36.8 C)-98.6 F (37 C)] 98.2 F (36.8 C) (10/22 0759) Pulse Rate:  [71-95] 78 (10/22 0600) Resp:  [6-22] 15 (10/22 0600) BP: (98-162)/(46-83) 118/56 (10/22 0600) SpO2:  [96 %-100 %] 99 % (10/22 0600) Weight:  [84.3 kg] 84.3 kg (10/21 1945) Last BM Date : 03/26/24 General:  Alert, Well-developed, in NAD Heart:  Regular rate and rhythm; no murmurs Pulm:  CTAB.  No W/R/R. Abdomen:  Soft, non-distended.  BS present.  Non-tender. Extremities:  Without edema. Neurologic:  Alert and oriented x 4;  grossly normal neurologically. Psych:  Alert and cooperative. Normal mood and affect.  Intake/Output from previous day: 10/21 0701 - 10/22 0700 In: 3645.4 [I.V.:1646.4; IV Piggyback:1999] Out: -   Lab Results: Recent Labs    03/25/24 0753 03/25/24 1143 03/25/24 1607 03/25/24 2109 03/26/24 0318  WBC 8.1 8.3  --   --  6.8  HGB 16.9 15.0 14.2 12.2* 10.5*  HCT 48.2 43.5 42.9 37.7* 32.3*  PLT 189 198  --   --  176   BMET Recent Labs    03/25/24 0753 03/26/24 0318  NA 138 136  K 4.1 4.2  CL 102 103  CO2 22 21*  GLUCOSE 198* 115*  BUN 19 19  CREATININE 1.03 0.91  CALCIUM  10.0 8.5*   LFT Recent Labs    03/25/24 0753  PROT 7.9  ALBUMIN 4.3  AST 26  ALT 41  ALKPHOS 106  BILITOT 0.8   CT Angio Abd/Pel W and/or Wo Contrast Result Date: 03/25/2024 CLINICAL DATA:  Several episodes of bloody stools this morning. History of partial colectomy with reversal in August 2025. Evaluate for lower GI bleed. EXAM: CTA ABDOMEN AND PELVIS WITHOUT AND WITH CONTRAST TECHNIQUE: Multidetector CT imaging of the abdomen and pelvis was performed using the standard protocol during bolus administration of intravenous contrast. Multiplanar reconstructed images and MIPs were obtained and reviewed to evaluate  the vascular anatomy. RADIATION DOSE REDUCTION: This exam was performed according to the departmental dose-optimization program which includes automated exposure control, adjustment of the mA and/or kV according to patient size and/or use of iterative reconstruction technique. CONTRAST:  OMNIPAQUE  IOHEXOL  350 MG/ML SOLN COMPARISON:  02/20/2023, 01/19/2023. FINDINGS: VASCULAR Aorta: Minimal calcified plaque over the descending thoracic aorta. Abdominal aorta is normal in caliber with minimal calcified plaque present. There is no evidence of aneurysm or dissection. Celiac: Patent without evidence of aneurysm, dissection, vasculitis or significant stenosis. SMA: Patent without evidence of aneurysm, dissection, vasculitis or significant stenosis. Renals: Both renal arteries are patent without evidence of aneurysm, dissection, vasculitis, fibromuscular dysplasia or significant stenosis. Small inferior accessory left renal arteries present. IMA: Patent without evidence of aneurysm, dissection, vasculitis or significant stenosis. Inflow: Patent without evidence of aneurysm, dissection, vasculitis or significant stenosis. Proximal Outflow: Bilateral common femoral and visualized portions of the superficial and profunda femoral arteries are patent without evidence of aneurysm, dissection, vasculitis or significant stenosis. Veins: No obvious venous abnormality within the limitations of this arterial phase study. Review of the MIP images confirms the above findings. NON-VASCULAR Lower chest: Heart is normal size. Calcified plaque over the right coronary artery and descending thoracic aorta. Lung bases are clear. Hepatobiliary: Liver, gallbladder and biliary tree are normal. Pancreas: Normal. Spleen: Normal. Adrenals/Urinary Tract: Adrenal glands  are normal. Kidneys are normal in size without hydronephrosis or nephrolithiasis. No focal renal mass. Ureters and bladder are normal. Stomach/Bowel: Stomach and small bowel are  normal. Appendix is normal. Evidence of patient's prior partial colectomy. Anastomotic site over the rectosigmoid colon is intact. No evidence of lower GI bleed. Lymphatic: No significant adenopathy. Reproductive: Prostate is unremarkable. Other: No free fluid or focal inflammatory change. Postsurgical changes over the anterior abdominal wall with diastasis of the rectus abdominus muscles at the level of the umbilicus. Musculoskeletal: Moderate degenerative changes of the spine. IMPRESSION: 1. No acute findings in the abdomen/pelvis. No evidence of lower GI bleed. 2. Evidence of patient's prior partial colectomy. 3. Aortic atherosclerosis. Atherosclerotic coronary artery disease. Aortic Atherosclerosis (ICD10-I70.0). Electronically Signed   By: Toribio Agreste M.D.   On: 03/25/2024 13:20   Assessment / Plan: *GI bleed in a patient with colostomy takedown/reversal in August.  Also had diverticulosis on recent colonoscopy in August.  Bleeding possibly diverticular versus from anastomosis site.  CTA negative for bleeding source.  Hemoglobin 10.5 g down from 15-16 gram baseline.  BUN is normal arguing against upper GI source.  Also, no NSAID use except 1 motrin  last week.  No bleeding in over 14 hours. *Recent complaints of dysphagia to pills and some solid food as well that has been occurring for a while and he meant to address it with Dr. Avram previously. *History of CVA in December 2024 on ASA 81 mg only  -Will plan for ongoing observation for now.  Will cancel procedures since no ongoing bleeding.   -Will allow full liquids and advance to soft for lunch if does ok. -Monitor Hgb. -Plan for discharge tomorrow AM if remains stable.   LOS: 0 days   Joshua [name]. Zehr  03/26/2024, 9:41 AM     Attending Physician's Attestation   I have taken an interval history, reviewed the chart and examined the patient.   We are happy that the patient no longer has had any further bleeding as of this morning.  He  has stopped the bleeding, the presumption is that this was a diverticular hemorrhage.  We will cancel the held slots for sigmoidoscopy/endoscopy.  Pending he is doing well into tomorrow, he will be able to be discharged.  Our team can then work on scheduling patient for an upper endoscopy and follow-up with Dr. Avram and workup of dysphagia symptoms that have developed.  This can be followed up as an outpatient otherwise.  If clinical changes develop we can consider sigmoidoscopy if necessary during this hospitalization.  I agree with the Advanced Practitioner's note, impression, and recommendations with updates and my documentation as noted above.  The majority of the medical decision making/process, formulation of the impression/plan of action for the patient were performed by me with substantive portion of this encounter (>50% time spent including complete performance of at least one of the key components of MDM, History, and/or Exam).   Aloha Finner, MD [facility] Advanced Endoscopy Office # [phone]

## 2024-03-26 NOTE — Progress Notes (Signed)
 [facility] PROGRESS NOTE    Progress Note  Joshua [name]  FMW:[id number] DOB: 1956-03-04 DOA: 03/25/2024 PCP: Micheal Wolm ORN, MD     Brief Narrative:   Joshua [name] is an 68 y.o. 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   Assessment/Plan:   Acute lower GI bleeding 2 large-bore's IV was placed.  Baseline hemoglobin around 16-15. CTA of the abdomen pelvis showed no acute findings.  But it did show evidence of prior colectomy Placed n.p.o. after midnight. Continue IV fluids with hemoglobin every 12 hours, this morning is 10.5 GI was consulted recommended conservative management possible flex sig Was started empirically on Protonix.  Type 2 diabetes mellitus (HCC) Continue sliding scale insulin , hold all oral hypoglycemic agents.  History of CVA: Continue to hold aspirin.  Psoriatic arthritis (HCC) Resume meds as an outpatient.  DVT prophylaxis: scd Family Communication:none Status is: Observation The patient remains OBS appropriate and will d/c before 2 midnights.  Transferred to MedSurg    Code Status:     Code Status Orders  (From admission, onward)           Start     Ordered   03/25/24 1600  Full code  Continuous       Question:  By:  Answer:  Consent: discussion documented in EHR   03/25/24 1600           Code Status History     Date Active Date Inactive Code Status Order ID Comments User Context   01/17/2024 1630 01/19/2024 1530 Full Code [id number]  Teresa Lonni HERO, MD Inpatient   02/20/2023 1636 03/02/2023 1927 Full Code [id number]  Doug Lyle LABOR, PA-C ED         IV  Access:   Peripheral IV   Procedures and diagnostic studies:   CT Angio Abd/Pel W and/or Wo Contrast Result Date: 03/25/2024 CLINICAL DATA:  Several episodes of bloody stools this morning. History of partial colectomy with reversal in August 2025. Evaluate for lower GI bleed. EXAM: CTA ABDOMEN AND PELVIS WITHOUT AND WITH CONTRAST TECHNIQUE: Multidetector CT imaging of the abdomen and pelvis was performed using the standard protocol during bolus administration of intravenous contrast. Multiplanar reconstructed images and MIPs were obtained and reviewed to evaluate the vascular anatomy. RADIATION DOSE REDUCTION: This exam was performed according to the departmental dose-optimization program which includes automated exposure control, adjustment of the mA and/or kV according to patient size and/or use of iterative reconstruction technique. CONTRAST:  OMNIPAQUE  IOHEXOL  350 MG/ML SOLN COMPARISON:  02/20/2023, 01/19/2023. FINDINGS: VASCULAR Aorta: Minimal calcified plaque over the descending thoracic aorta. Abdominal aorta is normal in caliber with minimal calcified plaque present. There is no evidence of aneurysm or dissection. Celiac: Patent without evidence of aneurysm, dissection, vasculitis or significant stenosis. SMA: Patent without evidence of aneurysm, dissection, vasculitis or significant stenosis. Renals: Both renal arteries are patent without evidence of aneurysm, dissection, vasculitis, fibromuscular dysplasia or significant stenosis. Small inferior accessory left renal arteries present. IMA: Patent without evidence of aneurysm, dissection, vasculitis or significant stenosis. Inflow: Patent without evidence of aneurysm, dissection, vasculitis or significant stenosis. Proximal Outflow: Bilateral common femoral and visualized portions of the superficial and profunda femoral arteries are  patent without evidence of aneurysm, dissection, vasculitis or significant stenosis. Veins: No obvious venous  abnormality within the limitations of this arterial phase study. Review of the MIP images confirms the above findings. NON-VASCULAR Lower chest: Heart is normal size. Calcified plaque over the right coronary artery and descending thoracic aorta. Lung bases are clear. Hepatobiliary: Liver, gallbladder and biliary tree are normal. Pancreas: Normal. Spleen: Normal. Adrenals/Urinary Tract: Adrenal glands are normal. Kidneys are normal in size without hydronephrosis or nephrolithiasis. No focal renal mass. Ureters and bladder are normal. Stomach/Bowel: Stomach and small bowel are normal. Appendix is normal. Evidence of patient's prior partial colectomy. Anastomotic site over the rectosigmoid colon is intact. No evidence of lower GI bleed. Lymphatic: No significant adenopathy. Reproductive: Prostate is unremarkable. Other: No free fluid or focal inflammatory change. Postsurgical changes over the anterior abdominal wall with diastasis of the rectus abdominus muscles at the level of the umbilicus. Musculoskeletal: Moderate degenerative changes of the spine. IMPRESSION: 1. No acute findings in the abdomen/pelvis. No evidence of lower GI bleed. 2. Evidence of patient's prior partial colectomy. 3. Aortic atherosclerosis. Atherosclerotic coronary artery disease. Aortic Atherosclerosis (ICD10-I70.0). Electronically Signed   By: Toribio Agreste M.D.   On: 03/25/2024 13:20     Medical Consultants:   None.   Subjective:    Kiley J Mannan had only 1 GI bleed overnight really hungry.  Objective:    Vitals:   03/26/24 0300 03/26/24 0400 03/26/24 0500 03/26/24 0600  BP: (!) 98/58 (!) 113/46 (!) 103/52 (!) 118/56  Pulse: 71 73 72 78  Resp: (!) 6 16 14 15   Temp: 98.6 F (37 C)     TempSrc: Oral     SpO2: 98% 98% 98% 99%  Weight:      Height:       SpO2: 99 %   Intake/Output Summary (Last 24 hours) at 03/26/2024 9367 Last data filed at 03/26/2024 0600 Gross per 24 hour  Intake 3645.43 ml  Output --   Net 3645.43 ml   Filed Weights   03/25/24 1945  Weight: 84.3 kg    Exam: General exam: In no acute distress. Respiratory system: Good air movement and clear to auscultation. Cardiovascular system: S1 & S2 heard, RRR. No JVD, Gastrointestinal system: Abdomen is nondistended, soft and nontender.  Extremities: No pedal edema. Skin: No rashes, lesions or ulcers Psychiatry: Judgement and insight appear normal. Mood & affect appropriate.    Data Reviewed:    Labs: Basic Metabolic Panel: Recent Labs  Lab 03/25/24 0753 03/26/24 0318  NA 138 136  K 4.1 4.2  CL 102 103  CO2 22 21*  GLUCOSE 198* 115*  BUN 19 19  CREATININE 1.03 0.91  CALCIUM  10.0 8.5*   GFR Estimated Creatinine Clearance: 82.2 mL/min (by C-G formula based on SCr of 0.91 mg/dL). Liver Function Tests: Recent Labs  Lab 03/25/24 0753  AST 26  ALT 41  ALKPHOS 106  BILITOT 0.8  PROT 7.9  ALBUMIN 4.3   No results for input(s): LIPASE, AMYLASE in the last 168 hours. No results for input(s): AMMONIA in the last 168 hours. Coagulation profile No results for input(s): INR, PROTIME in the last 168 hours. COVID-19 Labs  No results for input(s): DDIMER, FERRITIN, LDH, CRP in the last 72 hours.  Lab Results  Component Value Date   SARSCOV2NAA RESULT: NEGATIVE 05/03/2020    CBC: Recent Labs  Lab 03/25/24 0753 03/25/24 1143 03/25/24 1607 03/25/24 2109 03/26/24 0318  WBC 8.1 8.3  --   --  6.8  NEUTROABS 5.2  --   --   --   --   HGB 16.9 15.0 14.2 12.2* 10.5*  HCT 48.2 43.5 42.9 37.7* 32.3*  MCV 94.0 94.8  --   --  97.3  PLT 189 198  --   --  176   Cardiac Enzymes: No results for input(s): CKTOTAL, CKMB, CKMBINDEX, TROPONINI in the last 168 hours. BNP (last 3 results) No results for input(s): PROBNP in the last 8760 hours. CBG: Recent Labs  Lab 03/25/24 1807 03/25/24 2143  GLUCAP 164* 143*   D-Dimer: No results for input(s): DDIMER in the last 72 hours. Hgb  A1c: No results for input(s): HGBA1C in the last 72 hours. Lipid Profile: No results for input(s): CHOL, HDL, LDLCALC, TRIG, CHOLHDL, LDLDIRECT in the last 72 hours. Thyroid  function studies: No results for input(s): TSH, T4TOTAL, T3FREE, THYROIDAB in the last 72 hours.  Invalid input(s): FREET3 Anemia work up: No results for input(s): VITAMINB12, FOLATE, FERRITIN, TIBC, IRON, RETICCTPCT in the last 72 hours. Sepsis Labs: Recent Labs  Lab 03/25/24 0753 03/25/24 1143 03/26/24 0318  WBC 8.1 8.3 6.8   Microbiology No results found for this or any previous visit (from the past 240 hours).   Medications:    Chlorhexidine  Gluconate Cloth  6 each Topical Daily   insulin  aspart  0-6 Units Subcutaneous TID WC   pantoprazole (PROTONIX) IV  40 mg Intravenous Q12H   Continuous Infusions:  sodium chloride  Stopped (03/25/24 1628)   lactated ringers  125 mL/hr at 03/26/24 0600      LOS: 0 days   Erle Odell Castor  [facility]  03/26/2024, 6:32 AM

## 2024-03-26 NOTE — Progress Notes (Signed)
 Progress Note: General Surgery Service   Chief Complaint/Subjective: No Bms this AM  Objective: Vital signs in last 24 hours: Temp:  [98.2 F (36.8 C)-98.6 F (37 C)] 98.2 F (36.8 C) (10/22 0759) Pulse Rate:  [71-95] 78 (10/22 0600) Resp:  [6-22] 15 (10/22 0600) BP: (98-162)/(46-92) 118/56 (10/22 0600) SpO2:  [96 %-100 %] 99 % (10/22 0600) Weight:  [84.3 kg] 84.3 kg (10/21 1945) Last BM Date : 03/26/24  Intake/Output from previous day: 10/21 0701 - 10/22 0700 In: 3645.4 [I.V.:1646.4; IV Piggyback:1999] Out: -  Intake/Output this shift: No intake/output data recorded.  Constitutional: NAD; conversant; no deformities Eyes: Moist conjunctiva; no lid lag; anicteric; PERRL Neck: Trachea midline; no thyromegaly Lungs: Normal respiratory effort; no tactile fremitus CV: RRR; no palpable thrills; no pitting edema GI: Abd Soft, nontender; no palpable hepatosplenomegaly MSK: Normal range of motion of extremities; no clubbing/cyanosis Psychiatric: Appropriate affect; alert and oriented x3 Lymphatic: No palpable cervical or axillary lymphadenopathy  Lab Results: CBC  Recent Labs    03/25/24 1143 03/25/24 1607 03/25/24 2109 03/26/24 0318  WBC 8.3  --   --  6.8  HGB 15.0   < > 12.2* 10.5*  HCT 43.5   < > 37.7* 32.3*  PLT 198  --   --  176   < > = values in this interval not displayed.   BMET Recent Labs    03/25/24 0753 03/26/24 0318  NA 138 136  K 4.1 4.2  CL 102 103  CO2 22 21*  GLUCOSE 198* 115*  BUN 19 19  CREATININE 1.03 0.91  CALCIUM  10.0 8.5*   PT/INR No results for input(s): LABPROT, INR in the last 72 hours. ABG No results for input(s): PHART, HCO3 in the last 72 hours.  Invalid input(s): PCO2, PO2  Anti-infectives: Anti-infectives (From admission, onward)    None       Medications: Scheduled Meds:  Chlorhexidine  Gluconate Cloth  6 each Topical Daily   insulin  aspart  0-6 Units Subcutaneous TID WC   pantoprazole (PROTONIX) IV   40 mg Intravenous Q12H   Continuous Infusions:  sodium chloride  Stopped (03/25/24 1628)   lactated ringers  125 mL/hr at 03/26/24 0600   PRN Meds:.acetaminophen  **OR** acetaminophen , ondansetron  **OR** ondansetron  (ZOFRAN ) IV, mouth rinse  Assessment/Plan: Painless rectal bleeding S/p Laparotomy and hartmanns procedure 02/26/23 by Dr. Vernetta (Path without malagnancy).  Robotic takedown of colostomy, lysis of adhesions by Dr. Teresa 01/17/2024 -CTA abdomen and pelvis w/o and w contrast showed no acute findings in the abdomen/pelvis. No evidence of lower GI bleed. -Some drop in hemoglobin -No abdominal tenderness. No n/v. -Monitor HGB and transfuse if needed. -GI with plans for endoscopy, I think this is reasonable at this point post-op -Surgery team will follow peripherally, hopefully bleeding able to be addressed endoscopically     LOS: 0 days   Deward JINNY Foy, MD  [facility] Use [email or web address] to contact on call provider  Daily Billing: [phone] - Moderate MDM

## 2024-03-26 NOTE — Plan of Care (Signed)
  Problem: Education: Goal: Ability to describe self-care measures that may prevent or decrease complications (Diabetes Survival Skills Education) will improve Outcome: Progressing Goal: Individualized Educational Video(s) Outcome: Progressing   Problem: Coping: Goal: Ability to adjust to condition or change in health will improve Outcome: Progressing   Problem: Fluid Volume: Goal: Ability to maintain a balanced intake and output will improve Outcome: Progressing   Problem: Skin Integrity: Goal: Risk for impaired skin integrity will decrease Outcome: Progressing   Problem: Clinical Measurements: Goal: Will remain free from infection Outcome: Progressing   Problem: Coping: Goal: Level of anxiety will decrease Outcome: Progressing

## 2024-03-26 NOTE — Care Management Obs Status (Signed)
 MEDICARE OBSERVATION STATUS NOTIFICATION   Patient Details  Name: Joshua [name] MRN: [id number] Date of Birth: 1955/11/13   Medicare Observation Status Notification Given:  Yes    Jon ONEIDA Anon, RN 03/26/2024, 1:21 PM

## 2024-03-27 ENCOUNTER — Telehealth: Payer: Self-pay

## 2024-03-27 DIAGNOSIS — K922 Gastrointestinal hemorrhage, unspecified: Secondary | ICD-10-CM | POA: Diagnosis not present

## 2024-03-27 DIAGNOSIS — R1310 Dysphagia, unspecified: Secondary | ICD-10-CM

## 2024-03-27 DIAGNOSIS — K5731 Diverticulosis of large intestine without perforation or abscess with bleeding: Secondary | ICD-10-CM

## 2024-03-27 DIAGNOSIS — K921 Melena: Principal | ICD-10-CM

## 2024-03-27 DIAGNOSIS — Z9889 Other specified postprocedural states: Secondary | ICD-10-CM

## 2024-03-27 LAB — CBC
HCT: 30.8 % — ABNORMAL LOW (ref 39.0–52.0)
Hemoglobin: 10.1 g/dL — ABNORMAL LOW (ref 13.0–17.0)
MCH: 31.7 pg (ref 26.0–34.0)
MCHC: 32.8 g/dL (ref 30.0–36.0)
MCV: 96.6 fL (ref 80.0–100.0)
Platelets: 170 K/uL (ref 150–400)
RBC: 3.19 MIL/uL — ABNORMAL LOW (ref 4.22–5.81)
RDW: 13.2 % (ref 11.5–15.5)
WBC: 7 K/uL (ref 4.0–10.5)
nRBC: 0 % (ref 0.0–0.2)

## 2024-03-27 LAB — GLUCOSE, CAPILLARY: Glucose-Capillary: 187 mg/dL — ABNORMAL HIGH (ref 70–99)

## 2024-03-27 MED ORDER — ASPIRIN 81 MG PO CHEW: 81.0000 mg | CHEWABLE_TABLET | Freq: Every morning | ORAL | Status: AC

## 2024-03-27 NOTE — Progress Notes (Cosign Needed)
 [facility] Progress Note  CC:  GI bleed   Subjective:  Feels well.  Had a BM yesterday evening with normal stool and no blood in the bowl.  Had just some red blood on the toilet paper upon wiping and nothing since then.  Noises in abdomen have calmed down as well.  Feels great.  Ate 3 full meals yesterday.  Objective:  Vital signs in last 24 hours: Temp:  [98.1 F (36.7 C)-98.5 F (36.9 C)] 98.4 F (36.9 C) (10/23 0310) Pulse Rate:  [66-101] 75 (10/23 0310) Resp:  [10-21] 17 (10/23 0310) BP: (112-156)/(47-71) 128/56 (10/23 0310) SpO2:  [99 %-100 %] 100 % (10/23 0310) Last BM Date : 03/26/24 General:  Alert, Well-developed, in NAD Heart:  Regular rate and rhythm; no murmurs Pulm:  CTAB.  No W/R/R. Abdomen:  Soft, non-distended.  BS present.  Non-tender. Extremities:  Without edema. Neurologic:  Alert and oriented x 4;  grossly normal neurologically. Psych:  Alert and cooperative. Normal mood and affect.  Intake/Output from previous day: 10/22 0701 - 10/23 0700 In: 1489.3 [P.O.:360; I.V.:1129.3] Out: 0   Lab Results: Recent Labs    03/25/24 0753 03/25/24 1143 03/25/24 1607 03/25/24 2109 03/26/24 0318  WBC 8.1 8.3  --   --  6.8  HGB 16.9 15.0 14.2 12.2* 10.5*  HCT 48.2 43.5 42.9 37.7* 32.3*  PLT 189 198  --   --  176   BMET Recent Labs    03/25/24 0753 03/26/24 0318  NA 138 136  K 4.1 4.2  CL 102 103  CO2 22 21*  GLUCOSE 198* 115*  BUN 19 19  CREATININE 1.03 0.91  CALCIUM  10.0 8.5*   LFT Recent Labs    03/25/24 0753  PROT 7.9  ALBUMIN 4.3  AST 26  ALT 41  ALKPHOS 106  BILITOT 0.8   CT Angio Abd/Pel W and/or Wo Contrast Result Date: 03/25/2024 CLINICAL DATA:  Several episodes of bloody stools this morning. History of partial colectomy with reversal in August 2025. Evaluate for lower GI bleed. EXAM: CTA ABDOMEN AND PELVIS WITHOUT AND WITH CONTRAST TECHNIQUE: Multidetector CT imaging of the abdomen and pelvis was performed using  the standard protocol during bolus administration of intravenous contrast. Multiplanar reconstructed images and MIPs were obtained and reviewed to evaluate the vascular anatomy. RADIATION DOSE REDUCTION: This exam was performed according to the departmental dose-optimization program which includes automated exposure control, adjustment of the mA and/or kV according to patient size and/or use of iterative reconstruction technique. CONTRAST:  OMNIPAQUE  IOHEXOL  350 MG/ML SOLN COMPARISON:  02/20/2023, 01/19/2023. FINDINGS: VASCULAR Aorta: Minimal calcified plaque over the descending thoracic aorta. Abdominal aorta is normal in caliber with minimal calcified plaque present. There is no evidence of aneurysm or dissection. Celiac: Patent without evidence of aneurysm, dissection, vasculitis or significant stenosis. SMA: Patent without evidence of aneurysm, dissection, vasculitis or significant stenosis. Renals: Both renal arteries are patent without evidence of aneurysm, dissection, vasculitis, fibromuscular dysplasia or significant stenosis. Small inferior accessory left renal arteries present. IMA: Patent without evidence of aneurysm, dissection, vasculitis or significant stenosis. Inflow: Patent without evidence of aneurysm, dissection, vasculitis or significant stenosis. Proximal Outflow: Bilateral common femoral and visualized portions of the superficial and profunda femoral arteries are patent without evidence of aneurysm, dissection, vasculitis or significant stenosis. Veins: No obvious venous abnormality within the limitations of this arterial phase study. Review of the MIP images confirms the above findings. NON-VASCULAR Lower chest: Heart is normal size. Calcified  plaque over the right coronary artery and descending thoracic aorta. Lung bases are clear. Hepatobiliary: Liver, gallbladder and biliary tree are normal. Pancreas: Normal. Spleen: Normal. Adrenals/Urinary Tract: Adrenal glands are normal. Kidneys  are normal in size without hydronephrosis or nephrolithiasis. No focal renal mass. Ureters and bladder are normal. Stomach/Bowel: Stomach and small bowel are normal. Appendix is normal. Evidence of patient's prior partial colectomy. Anastomotic site over the rectosigmoid colon is intact. No evidence of lower GI bleed. Lymphatic: No significant adenopathy. Reproductive: Prostate is unremarkable. Other: No free fluid or focal inflammatory change. Postsurgical changes over the anterior abdominal wall with diastasis of the rectus abdominus muscles at the level of the umbilicus. Musculoskeletal: Moderate degenerative changes of the spine. IMPRESSION: 1. No acute findings in the abdomen/pelvis. No evidence of lower GI bleed. 2. Evidence of patient's prior partial colectomy. 3. Aortic atherosclerosis. Atherosclerotic coronary artery disease. Aortic Atherosclerosis (ICD10-I70.0). Electronically Signed   By: Toribio Agreste M.D.   On: 03/25/2024 13:20   Assessment / Plan: *GI bleed in a patient with colostomy takedown/reversal in August.  Also had diverticulosis on recent colonoscopy in August.  Bleeding possibly diverticular versus from anastomosis site.  CTA negative for bleeding source.  Hemoglobin 10.5 g down from 15-16 gram baseline.  BUN is normal arguing against upper GI source.  Also, no NSAID use except 1 motrin  last week.  *Recent complaints of dysphagia to pills and some solid food as well that has been occurring for a while and he meant to address it with Dr. Avram previously. *History of CVA in December 2024 on ASA 81 mg only   -Anticipate discharge today.  Await results of AM CBC.  I will get him outpatient follow-up and can address dysphagia issues at that time.    LOS: 0 days   Harlene BIRCH. Zehr  03/27/2024, 9:19 AM     Attending Physician's Attestation   I have taken an interval history, reviewed the chart and examined the patient.   Thankfully the patient has done well without significant  change in his status.  He has had returned of his normal bowel habits with a small amount of blood noted in toilet paper.  No overt signs of significant bleeding otherwise.  Hemoglobin is stable.  No new abdominal pain or discomfort noted on examination.  Reasonable that this was a diverticular hemorrhage he is okay for discharge.  Should he have recurrence of his symptoms, could consider a sigmoidoscopy in the future to evaluate the anastomosis, but thankfully looks like this was just diverticular in origin.  We will sign off at this time.  I agree with the Advanced Practitioner's note, impression, and recommendations with updates and my documentation as noted above.  The majority of the medical decision making/process, formulation of the impression/plan of action for the patient were performed by me with substantive portion of this encounter (>50% time spent including complete performance of at least one of the key components of MDM, History, and/or Exam).   Aloha Finner, MD [facility] Advanced [facility]oscopy Office # [phone]

## 2024-03-27 NOTE — Plan of Care (Signed)
  Problem: Safety: Goal: Ability to remain free from injury will improve Outcome: Progressing   Problem: Pain Managment: Goal: General experience of comfort will improve and/or be controlled Outcome: Progressing   Problem: Elimination: Goal: Will not experience complications related to urinary retention Outcome: Progressing   Problem: Activity: Goal: Risk for activity intolerance will decrease Outcome: Progressing

## 2024-03-27 NOTE — Discharge Summary (Signed)
 Physician Discharge Summary  Joshua [name] FMW:[id number] DOB: 09-Jul-1955 DOA: 03/25/2024  PCP: Micheal Wolm ORN, MD  Admit date: 03/25/2024 Discharge date: 03/27/2024  Admitted From: Home Disposition:  Home  Recommendations for Outpatient Follow-up:  Follow up with GI in 1-2 weeks Please obtain CBC in one week   Home Health:no Equipment/Devices:None  Discharge Condition:Stable CODE STATUS:FUll Diet recommendation: Heart Healthy    Brief/Interim Summary:  68 y.o. male past medical history significant for psoriatic arthritis, perianal hidradenitis suppurativa, diabetes mellitus type 2, essential hypertension, status post Hartmann procedure in September 2024 for chronic diverticulitis with a takedown and lysis of adhesion in August 2025, right sided MCA in December 2024 comes in for rectal bleeding about 7 bloody bowel movements and also felt lightheaded.  Discharge Diagnoses:  Principal Problem:   Acute lower GI bleeding Active Problems:   Type 2 diabetes mellitus (HCC)   Psoriatic arthritis (HCC)   Cerebrovascular accident (CVA) (HCC)   S/P colostomy takedown   Hematochezia   Diverticulosis of colon with hemorrhage   History of colon surgery  Acute lower GI bleed: 2 large-bore's IV were placed. His hemoglobin usually runs around 16-15. Question diverticular bleed. CT scan of the abdomen pelvis showed no acute findings that did show evidence of prior colectomy. His hemoglobin dropped to 10.5 but his bleeding resolved.  Diabetes mellitus type 2: No change made to his medication resume oral hypoglycemic agents and outpatient.  History of CVA: Will resume aspirin in 2 days.  Psoriatic arthritis: Follow-up with rheumatology as an outpatient.  Discharge Instructions  Discharge Instructions     Diet - low sodium heart healthy   Complete by: As directed    Increase activity slowly   Complete by: As directed       Allergies as of 03/27/2024       Reactions    Penicillins Hives   And fever blisters   Adalimumab Other (See Comments)   Wasn't effective (Humira)   Metformin  And Related Nausea Only, Other (See Comments)   Upset the stomach        Medication List     TAKE these medications    acetaminophen  500 MG tablet Commonly known as: TYLENOL  Take 2 tablets (1,000 mg total) by mouth every 6 (six) hours as needed.   aspirin 81 MG chewable tablet Chew 1 tablet (81 mg total) by mouth in the morning. Start taking on: March 29, 2024 What changed: These instructions start on March 29, 2024. If you are unsure what to do until then, ask your doctor or other care provider.   Bayer Microlet Lancets lancets Check Blood Sugar Once Daily.   Benefiber Powd Take 1 Scoop by mouth in the morning. Mix 1 scoopful (tablespoonful) as directed into a desired beverage and drink by mouth in the morning   CINNAMON PO Take 1 capsule by mouth daily.   Cosentyx Sensoready (300 MG) 150 MG/ML Soaj Generic drug: Secukinumab (300 MG Dose) Inject 300 mg into the skin every 28 (twenty-eight) days.   empagliflozin  25 MG Tabs tablet Commonly known as: Jardiance  Take 1 tablet (25 mg total) by mouth daily before breakfast.   Motrin  IB 200 MG tablet Generic drug: ibuprofen  Take 800 mg by mouth every 6 (six) hours as needed for mild pain (pain score 1-3) (or headaches).   multivitamin tablet Take 1 tablet by mouth daily with breakfast.   OneTouch Ultra test strip Generic drug: glucose blood USE AS DIRECTED TO CHECK BLOOD GLUCOSE ONE TIME DAILY  polyethylene glycol 17 g packet Commonly known as: MIRALAX  / GLYCOLAX  Take 17 g by mouth daily as needed for mild constipation.   rosuvastatin  10 MG tablet Commonly known as: CRESTOR  TAKE 1 TABLET(10 MG) BY MOUTH DAILY        Follow-up Information     Zehr, Jessica D, PA-C Follow up on 05/15/2024.   Specialty: Gastroenterology Why: 9:30 am Contact information: [address] [place]  [address] [phone]                Allergies  Allergen Reactions   Penicillins Hives    And fever blisters   Adalimumab Other (See Comments)    Wasn't effective (Humira)   Metformin  And Related Nausea Only and Other (See Comments)    Upset the stomach    Consultations: Gastroenterology   Procedures/Studies: CUP PACEART REMOTE DEVICE CHECK Result Date: 03/25/2024 ILR summary report received. Battery status OK. Normal device function. No new symptom, tachy, brady, or pause episodes. 218 new AT episodes, not a new finding.  V-rates 74-120 bpm, episodes appear SR/ST. Episodes 2-26 min.  Monthly summary reports and ROV/PRN ML, CVRS  CT Angio Abd/Pel W and/or Wo Contrast Result Date: 03/25/2024 CLINICAL DATA:  Several episodes of bloody stools this morning. History of partial colectomy with reversal in August 2025. Evaluate for lower GI bleed. EXAM: CTA ABDOMEN AND PELVIS WITHOUT AND WITH CONTRAST TECHNIQUE: Multidetector CT imaging of the abdomen and pelvis was performed using the standard protocol during bolus administration of intravenous contrast. Multiplanar reconstructed images and MIPs were obtained and reviewed to evaluate the vascular anatomy. RADIATION DOSE REDUCTION: This exam was performed according to the departmental dose-optimization program which includes automated exposure control, adjustment of the mA and/or kV according to patient size and/or use of iterative reconstruction technique. CONTRAST:  OMNIPAQUE  IOHEXOL  350 MG/ML SOLN COMPARISON:  02/20/2023, 01/19/2023. FINDINGS: VASCULAR Aorta: Minimal calcified plaque over the descending thoracic aorta. Abdominal aorta is normal in caliber with minimal calcified plaque present. There is no evidence of aneurysm or dissection. Celiac: Patent without evidence of aneurysm, dissection, vasculitis or significant stenosis. SMA: Patent without evidence of aneurysm, dissection, vasculitis or significant stenosis. Renals: Both  renal arteries are patent without evidence of aneurysm, dissection, vasculitis, fibromuscular dysplasia or significant stenosis. Small inferior accessory left renal arteries present. IMA: Patent without evidence of aneurysm, dissection, vasculitis or significant stenosis. Inflow: Patent without evidence of aneurysm, dissection, vasculitis or significant stenosis. Proximal Outflow: Bilateral common femoral and visualized portions of the superficial and profunda femoral arteries are patent without evidence of aneurysm, dissection, vasculitis or significant stenosis. Veins: No obvious venous abnormality within the limitations of this arterial phase study. Review of the MIP images confirms the above findings. NON-VASCULAR Lower chest: Heart is normal size. Calcified plaque over the right coronary artery and descending thoracic aorta. Lung bases are clear. Hepatobiliary: Liver, gallbladder and biliary tree are normal. Pancreas: Normal. Spleen: Normal. Adrenals/Urinary Tract: Adrenal glands are normal. Kidneys are normal in size without hydronephrosis or nephrolithiasis. No focal renal mass. Ureters and bladder are normal. Stomach/Bowel: Stomach and small bowel are normal. Appendix is normal. Evidence of patient's prior partial colectomy. Anastomotic site over the rectosigmoid colon is intact. No evidence of lower GI bleed. Lymphatic: No significant adenopathy. Reproductive: Prostate is unremarkable. Other: No free fluid or focal inflammatory change. Postsurgical changes over the anterior abdominal wall with diastasis of the rectus abdominus muscles at the level of the umbilicus. Musculoskeletal: Moderate degenerative changes of the spine. IMPRESSION:  1. No acute findings in the abdomen/pelvis. No evidence of lower GI bleed. 2. Evidence of patient's prior partial colectomy. 3. Aortic atherosclerosis. Atherosclerotic coronary artery disease. Aortic Atherosclerosis (ICD10-I70.0). Electronically Signed   By: Toribio Agreste  M.D.   On: 03/25/2024 13:20     Subjective: No complaints  Discharge Exam: Vitals:   03/26/24 2123 03/27/24 0310  BP: (!) 119/54 (!) 128/56  Pulse: 66 75  Resp: 16 17  Temp: 98.5 F (36.9 C) 98.4 F (36.9 C)  SpO2: 100% 100%   Vitals:   03/26/24 1600 03/26/24 1855 03/26/24 2123 03/27/24 0310  BP:  136/60 (!) 119/54 (!) 128/56  Pulse:  90 66 75  Resp:  16 16 17   Temp: 98.4 F (36.9 C) 98.5 F (36.9 C) 98.5 F (36.9 C) 98.4 F (36.9 C)  TempSrc: Oral Oral Oral Oral  SpO2:  100% 100% 100%  Weight:      Height:        General: Pt is alert, awake, not in acute distress Cardiovascular: RRR, S1/S2 +, no rubs, no gallops Respiratory: CTA bilaterally, no wheezing, no rhonchi Abdominal: Soft, NT, ND, bowel sounds + Extremities: no edema, no cyanosis    The results of significant diagnostics from this hospitalization (including imaging, microbiology, ancillary and laboratory) are listed below for reference.     Microbiology: No results found for this or any previous visit (from the past 240 hours).   Labs: BNP (last 3 results) No results for input(s): BNP in the last 8760 hours. Basic Metabolic Panel: Recent Labs  Lab 03/25/24 0753 03/26/24 0318  NA 138 136  K 4.1 4.2  CL 102 103  CO2 22 21*  GLUCOSE 198* 115*  BUN 19 19  CREATININE 1.03 0.91  CALCIUM  10.0 8.5*   Liver Function Tests: Recent Labs  Lab 03/25/24 0753  AST 26  ALT 41  ALKPHOS 106  BILITOT 0.8  PROT 7.9  ALBUMIN 4.3   No results for input(s): LIPASE, AMYLASE in the last 168 hours. No results for input(s): AMMONIA in the last 168 hours. CBC: Recent Labs  Lab 03/25/24 0753 03/25/24 1143 03/25/24 1607 03/25/24 2109 03/26/24 0318 03/27/24 0954  WBC 8.1 8.3  --   --  6.8 7.0  NEUTROABS 5.2  --   --   --   --   --   HGB 16.9 15.0 14.2 12.2* 10.5* 10.1*  HCT 48.2 43.5 42.9 37.7* 32.3* 30.8*  MCV 94.0 94.8  --   --  97.3 96.6  PLT 189 198  --   --  176 170   Cardiac  Enzymes: No results for input(s): CKTOTAL, CKMB, CKMBINDEX, TROPONINI in the last 168 hours. BNP: Invalid input(s): POCBNP CBG: Recent Labs  Lab 03/26/24 0739 03/26/24 1208 03/26/24 1650 03/26/24 2125 03/27/24 0721  GLUCAP 117* 219* 192* 206* 187*   D-Dimer No results for input(s): DDIMER in the last 72 hours. Hgb A1c No results for input(s): HGBA1C in the last 72 hours. Lipid Profile No results for input(s): CHOL, HDL, LDLCALC, TRIG, CHOLHDL, LDLDIRECT in the last 72 hours. Thyroid  function studies No results for input(s): TSH, T4TOTAL, T3FREE, THYROIDAB in the last 72 hours.  Invalid input(s): FREET3 Anemia work up No results for input(s): VITAMINB12, FOLATE, FERRITIN, TIBC, IRON, RETICCTPCT in the last 72 hours. Urinalysis    Component Value Date/Time   BILIRUBINUR neg 10/17/2022 1502   PROTEINUR Negative 10/17/2022 1502   UROBILINOGEN negative (A) 10/17/2022 1502   NITRITE neg 10/17/2022 1502  LEUKOCYTESUR Negative 10/17/2022 1502   Sepsis Labs Recent Labs  Lab 03/25/24 0753 03/25/24 1143 03/26/24 0318 03/27/24 0954  WBC 8.1 8.3 6.8 7.0   Microbiology No results found for this or any previous visit (from the past 240 hours).   Time coordinating discharge: Over 35 minutes  SIGNED:   Erle Odell Castor, MD  [facility] 03/27/2024, 10:16 AM Pager   If 7PM-7AM, please contact night-coverage [email or web address] Password [email or web address]

## 2024-03-27 NOTE — Telephone Encounter (Signed)
-----   Message from Elmira D. Zehr sent at 03/27/2024 11:02 AM EDT ----- Patient just got discharged from the hospital.  I had scheduled him to see me in follow-up about his dysphagia issues to schedule an EGD with Avram, but that appointment is not until December 11.  Mansouraty told me that we can go ahead and just schedule him for a direct EGD with Avram.  Can you please contact him to do that?  Thank you,  Jess

## 2024-03-28 ENCOUNTER — Encounter: Payer: Self-pay | Admitting: Internal Medicine

## 2024-03-28 NOTE — Progress Notes (Signed)
 Remote Loop Recorder Transmission

## 2024-03-28 NOTE — Telephone Encounter (Signed)
 The pt has been advised and EGD has been set up. Pt instructed over the phone and also sent to My Chart

## 2024-03-31 ENCOUNTER — Ambulatory Visit: Payer: Self-pay | Admitting: Cardiology

## 2024-04-02 NOTE — Progress Notes (Unsigned)
 [name] History and Physical   Primary Care Physician:  Micheal Wolm ORN, MD   Reason for Procedure:    Encounter Diagnosis  Name Primary?   Esophageal dysphagia Yes     Plan:    EGD, esophageal dilation     HPI: Joshua [name] is a 68 y.o. male recently admitted to the hospital with lower GI bleed which resolved spontaneously - he is s/p colon resection of diverticular stricture with Hartmann's pouch  and recent colostomy takedown. He c/o dysphagia when in hospital (pills and some foods) and EGD was arranged by GI team.   Past Medical History:  Diagnosis Date   Anal fistula    Chronic kidney disease    Hidradenitis    History of bladder cancer 2009   urologist-- dr rosalind---  s/p TURBT 2010,  recurrence in office 2014 ,  none since per pt   History of colonic polyps    History of COVID-19 04/2019   per pt had covid pneumonia recovered at home, no oxygen, symptoms resolved   History of diverticulitis of colon    History of hypertension 04/05/2009   per the pt he lost 55 lbs and htn has been reversed   History of kidney stones    Hydradenitis    Hyperlipidemia 04/05/2009   pt states he does not take med for cholesterol   Hypertension    OA (osteoarthritis) 04/05/2009   Pneumonia    Psoriatic arthritis (HCC)    rheumonotologist--- dr ronal jacob   Stroke [facility])    2024   Type 2 diabetes mellitus (HCC) 04/05/2009   followed by pcp---  (01-31-2021  per pt only checks blood sugar once weekly, last fasting sugar-- 122)    Past Surgical History:  Procedure Laterality Date   COLONOSCOPY     last one 11-04-2020  by Tyreque Finken   CYSTOSCOPY WITH INDOCYANINE GREEN  IMAGING (ICG) N/A 01/17/2024   Procedure: CYSTOSCOPY WITH INDOCYANINE GREEN  IMAGING (ICG);  Surgeon: Shane Steffan BROCKS, MD;  Location: [facility];  Service: Urology;  Laterality: N/A;  FIREFLYER   EXTRACORPOREAL SHOCK WAVE LITHOTRIPSY  2015   FLEXIBLE SIGMOIDOSCOPY N/A 01/17/2024   Procedure:  [place] SIDE;  Surgeon: Teresa Lonni HERO, MD;  Location: [facility];  Service: General;  Laterality: N/A;   INCISION AND DRAINAGE ABSCESS N/A 02/03/2021   Procedure: EXCISION OF PERIANAL AND PERINEAL HYDRADENITIS, INTERROGATION OF PERIANAL WOUNDS;  Surgeon: Teresa Lonni HERO, MD;  Location: [facility];  Service: General;  Laterality: N/A;   LAPAROSCOPIC LYSIS OF ADHESIONS N/A 01/17/2024   Procedure: LYSIS, ADHESIONS, LAPAROSCOPIC;  Surgeon: Teresa Lonni HERO, MD;  Location: [facility];  Service: General;  Laterality: N/A;   PARTIAL COLECTOMY N/A 02/26/2023   Procedure: EXPLORATORY LAPAROTOMY; PARTIAL COLECTOMY; COLOSTOMY;  Surgeon: Vernetta Berg, MD;  Location: [facility];  Service: General;  Laterality: N/A;   RECTAL EXAM UNDER ANESTHESIA  02/03/2021   Procedure: ANORECTAL EXAM UNDER ANESTHESIA;  Surgeon: Teresa Lonni HERO, MD;  Location: [facility];  Service: General;;   TONSILLECTOMY     child   TRANSURETHRAL RESECTION OF BLADDER TUMOR  02/2008   XI ROBOTIC ASSISTED COLOSTOMY TAKEDOWN N/A 01/17/2024   Procedure: CLOSURE, COLOSTOMY, ROBOT-ASSISTED;  Surgeon: Teresa Lonni HERO, MD;  Location: [facility];  Service: General;  Laterality: N/A;     Current Outpatient Medications  Medication Sig Dispense Refill   acetaminophen  (TYLENOL ) 500 MG tablet Take 2 tablets (1,000 mg total) by mouth every 6 (six) hours as needed.  30 tablet 0   aspirin 81 MG chewable tablet Chew 1 tablet (81 mg total) by mouth in the morning.     BAYER MICROLET LANCETS lancets Check Blood Sugar Once Daily. 30 each 5   CINNAMON PO Take 1 capsule by mouth daily.     empagliflozin  (JARDIANCE ) 25 MG TABS tablet Take 1 tablet (25 mg total) by mouth daily before breakfast. 90 tablet 3   Multiple Vitamin (MULTIVITAMIN) tablet Take 1 tablet by mouth daily with breakfast.     ONETOUCH ULTRA test strip USE AS DIRECTED TO CHECK BLOOD GLUCOSE ONE TIME DAILY 100 strip 10   rosuvastatin   (CRESTOR ) 10 MG tablet TAKE 1 TABLET(10 MG) BY MOUTH DAILY 90 tablet 1   Wheat Dextrin (BENEFIBER) POWD Take 1 Scoop by mouth in the morning. Mix 1 scoopful (tablespoonful) as directed into a desired beverage and drink by mouth in the morning     COSENTYX SENSOREADY, 300 MG, 150 MG/ML SOAJ Inject 300 mg into the skin every 28 (twenty-eight) days.     MOTRIN  IB 200 MG tablet Take 800 mg by mouth every 6 (six) hours as needed for mild pain (pain score 1-3) (or headaches).     polyethylene glycol (MIRALAX  / GLYCOLAX ) 17 g packet Take 17 g by mouth daily as needed for mild constipation. (Patient not taking: Reported on 03/25/2024) 14 each 0   Current Facility-Administered Medications  Medication Dose Route Frequency Provider Last Rate Last Admin   0.9 %  sodium chloride  infusion  500 mL Intravenous Once Avram Lupita BRAVO, MD        Allergies as of 04/03/2024 - Review Complete 04/03/2024  Allergen Reaction Noted   Penicillins Hives 04/05/2009   Adalimumab Other (See Comments) 09/08/2020   Metformin  and related Nausea Only and Other (See Comments) 10/12/2023    Family History  Problem Relation Age of Onset   Diabetes Mother    Hypertension Mother    Arthritis Father    Crohn's disease Daughter    Esophageal cancer Neg Hx    Colon cancer Neg Hx    Pancreatic cancer Neg Hx    Stomach cancer Neg Hx    Colon polyps Neg Hx    Rectal cancer Neg Hx     Social History   Socioeconomic History   Marital status: Married    Spouse name: Not on file   Number of children: Not on file   Years of education: Not on file   Highest education level: Bachelor's degree (e.g., BA, AB, BS)  Occupational History   Not on file  Tobacco Use   Smoking status: Some Days    Types: Cigars   Smokeless tobacco: Never   Tobacco comments:    01-31-2021  per pt quit cigarettes 1981 (age 6) for 8 yrs;  since then has smoked cigar's socially  Vaping Use   Vaping status: Never Used  Substance and Sexual Activity    Alcohol use: Yes    Alcohol/week: 14.0 standard drinks of alcohol    Types: 14 Cans of beer per week   Drug use: No   Sexual activity: Not on file  Other Topics Concern   Not on file  Social History Narrative   Patient is married, a daughter has Crohn's disease   No alcohol or drug use he is an intermittent smoker of cigarettes and cigars   Social Drivers of Corporate Investment Banker Strain: Low Risk  (07/10/2023)   Overall Financial Resource Strain (CARDIA)  Difficulty of Paying Living Expenses: Not hard at all  Food Insecurity: No Food Insecurity (03/25/2024)   Hunger Vital Sign    Worried About Running Out of Food in the Last Year: Never true    Ran Out of Food in the Last Year: Never true  Transportation Needs: No Transportation Needs (03/25/2024)   PRAPARE - Administrator, Civil Service (Medical): No    Lack of Transportation (Non-Medical): No  Physical Activity: Insufficiently Active (07/10/2023)   Exercise Vital Sign    Days of Exercise per Week: 4 days    Minutes of Exercise per Session: 30 min  Stress: No Stress Concern Present (07/10/2023)   [employer] of Occupational Health - Occupational Stress Questionnaire    Feeling of Stress : Not at all  Social Connections: Socially Integrated (03/25/2024)   Social Connection and Isolation Panel    Frequency of Communication with Friends and Family: Twice a week    Frequency of Social Gatherings with Friends and Family: Once a week    Attends Religious Services: 1 to 4 times per year    Active Member of [employer] or Organizations: Yes    Attends Engineer, Structural: More than 4 times per year    Marital Status: Married  Catering Manager Violence: Not At Risk (03/25/2024)   Humiliation, Afraid, Rape, and Kick questionnaire    Fear of Current or Ex-Partner: No    Emotionally Abused: No    Physically Abused: No    Sexually Abused: No    Review of Systems:  All other review of systems negative  except as mentioned in the HPI.  Physical Exam: Vital signs BP (!) 108/56   Pulse 70   Temp 98.4 F (36.9 C) (Temporal)   Ht 5' 8 (1.727 m)   Wt 185 lb (83.9 kg)   SpO2 100%   BMI 28.13 kg/m   General:   Alert,  Well-developed, well-nourished, pleasant and cooperative in NAD Lungs:  Clear throughout to auscultation.   Heart:  Regular rate and rhythm; no murmurs, clicks, rubs,  or gallops. Abdomen:  Soft, nontender and nondistended. Normal bowel sounds.   Neuro/Psych:  Alert and cooperative. Normal mood and affect. A and O x 3   @Lorik Guo  CHARLENA Commander, MD, [facility] [phone] (pager) 04/03/2024 11:01 AM@

## 2024-04-03 ENCOUNTER — Encounter: Payer: Self-pay | Admitting: Internal Medicine

## 2024-04-03 ENCOUNTER — Ambulatory Visit: Admitting: Internal Medicine

## 2024-04-03 VITALS — BP 130/68 | HR 62 | Temp 98.4°F | Resp 17 | Ht 68.0 in | Wt 185.0 lb

## 2024-04-03 DIAGNOSIS — R1310 Dysphagia, unspecified: Secondary | ICD-10-CM

## 2024-04-03 DIAGNOSIS — K2951 Unspecified chronic gastritis with bleeding: Secondary | ICD-10-CM

## 2024-04-03 DIAGNOSIS — K2970 Gastritis, unspecified, without bleeding: Secondary | ICD-10-CM

## 2024-04-03 DIAGNOSIS — B9681 Helicobacter pylori [H. pylori] as the cause of diseases classified elsewhere: Secondary | ICD-10-CM

## 2024-04-03 DIAGNOSIS — K3189 Other diseases of stomach and duodenum: Secondary | ICD-10-CM

## 2024-04-03 DIAGNOSIS — R1319 Other dysphagia: Secondary | ICD-10-CM

## 2024-04-03 DIAGNOSIS — K2950 Unspecified chronic gastritis without bleeding: Secondary | ICD-10-CM | POA: Diagnosis not present

## 2024-04-03 MED ORDER — SODIUM CHLORIDE 0.9 % IV SOLN: 500.0000 mL | Freq: Once | INTRAVENOUS | Status: DC

## 2024-04-03 NOTE — Progress Notes (Signed)
 Pt's states no medical or surgical changes since previsit or office visit.

## 2024-04-03 NOTE — Patient Instructions (Addendum)
 The stomach is inflamed.  I took biopsies to understand that better.  Your esophagus looked normal.  No cause of swallowing problems found on the exam.  I did dilate the esophagus.  Looking at the neck CT scans you had recently I think your problem is coming from changes in your cervical spine with something called osteophytes that pushed forward and are causing your swallowing difficulties.    You should modify your diet as needed and sometimes tucking your chin down (flexion of the neck) can facilitate swallowing.  I can refer you to speech therapists who help people with this if it is bad enough.  Just let me know.  I appreciate the opportunity to care for you. Lupita CHARLENA Commander, MD, [facility]  Handouts given: Gastritis, Post Esophageal Dilation Diet Clear liquids x 1 hour then soft foods rest of day. Start prior diet tomorrow. Continue present medications.  Await pathology results. Diet modification, chin tuck w/ swallow - see speech therapy if needed.  YOU HAD AN ENDOSCOPIC PROCEDURE TODAY AT THE [facility]:   Refer to the procedure report that was given to you for any specific questions about what was found during the examination.  If the procedure report does not answer your questions, please call your gastroenterologist to clarify.  If you requested that your care partner not be given the details of your procedure findings, then the procedure report has been included in a sealed envelope for you to review at your convenience later.  YOU SHOULD EXPECT: Some feelings of bloating in the abdomen. Passage of more gas than usual.  Walking can help get rid of the air that was put into your GI tract during the procedure and reduce the bloating. If you had a lower endoscopy (such as a colonoscopy or flexible sigmoidoscopy) you may notice spotting of blood in your stool or on the toilet paper. If you underwent a bowel prep for your procedure, you may not have a normal bowel movement for a few  days.  Please Note:  You might notice some irritation and congestion in your nose or some drainage.  This is from the oxygen used during your procedure.  There is no need for concern and it should clear up in a day or so.  SYMPTOMS TO REPORT IMMEDIATELY:  Following upper endoscopy (EGD)  Vomiting of blood or coffee ground material  New chest pain or pain under the shoulder blades  Painful or persistently difficult swallowing  New shortness of breath  Fever of 100F or higher  Black, tarry-looking stools  For urgent or emergent issues, a gastroenterologist can be reached at any hour by calling [phone]. Do not use MyChart messaging for urgent concerns.    DIET:  We do recommend a small meal at first, but then you may proceed to your regular diet.  Drink plenty of fluids but you should avoid alcoholic beverages for 24 hours.  ACTIVITY:  You should plan to take it easy for the rest of today and you should NOT DRIVE or use heavy machinery until tomorrow (because of the sedation medicines used during the test).    FOLLOW UP: Our staff will call the number listed on your records the next business day following your procedure.  We will call around 7:15- 8:00 am to check on you and address any questions or concerns that you may have regarding the information given to you following your procedure. If we do not reach you, we will leave a message.  If any biopsies were taken you will be contacted by phone or by letter within the next 1-3 weeks.  Please call us  at [phone] if you have not heard about the biopsies in 3 weeks.    SIGNATURES/CONFIDENTIALITY: You and/or your care partner have signed paperwork which will be entered into your electronic medical record.  These signatures attest to the fact that that the information above on your After Visit Summary has been reviewed and is understood.  Full responsibility of the confidentiality of this discharge information lies with you  and/or your care-partner.

## 2024-04-03 NOTE — Progress Notes (Signed)
 1104 Robinul 0.1 mg IV given due large amount of secretions upon assessment.  MD made aware, vss

## 2024-04-03 NOTE — Op Note (Signed)
 [facility] Patient Name: Joshua [name] Procedure Date: 04/03/2024 11:01 AM MRN: [id number] Endoscopist: Lupita FORBES Commander , MD, [id number] Age: 68 Referring MD:  Date of Birth: Mar 01, 1956 Gender: Male Account #: [id number] Procedure:                Upper GI endoscopy Indications:              Dysphagia Medicines:                Monitored Anesthesia Care Procedure:                Pre-Anesthesia Assessment:                           - Prior to the procedure, a History and Physical                            was performed, and patient medications and                            allergies were reviewed. The patient's tolerance of                            previous anesthesia was also reviewed. The risks                            and benefits of the procedure and the sedation                            options and risks were discussed with the patient.                            All questions were answered, and informed consent                            was obtained. Prior Anticoagulants: The patient has                            taken no anticoagulant or antiplatelet agents. ASA                            Grade Assessment: II - A patient with mild systemic                            disease. After reviewing the risks and benefits,                            the patient was deemed in satisfactory condition to                            undergo the procedure.                           After obtaining informed consent, the endoscope was  passed under direct vision. Throughout the                            procedure, the patient's blood pressure, pulse, and                            oxygen saturations were monitored continuously. The                            Olympus Scope [id number] was introduced through the                            mouth, and advanced to the second part of duodenum.                            The upper GI endoscopy was accomplished  without                            difficulty. The patient tolerated the procedure                            well. Scope In: Scope Out: Findings:                 No endoscopic abnormality was evident in the                            esophagus to explain the patient's complaint of                            dysphagia. It was decided, however, to proceed with                            dilation of the entire esophagus. The scope was                            withdrawn. Dilation was performed with a Maloney                            dilator with mild resistance at 54 Fr. The dilation                            site was examined and showed no change. Estimated                            blood loss: none.                           Diffuse moderate inflammation characterized by                            punctate submucosal heme was found in the gastric                            fundus  and in the gastric body. Biopsies were taken                            with a cold forceps for histology. Verification of                            patient identification for the specimen was done.                            Estimated blood loss was minimal.                           Patchy mildly erythematous mucosa without bleeding                            was found in the gastric antrum. Biopsies were                            taken with a cold forceps for histology.                            Verification of patient identification for the                            specimen was done. Estimated blood loss was minimal.                           The exam was otherwise without abnormality.                           The cardia and gastric fundus were otherwise normal                            on retroflexion.                           The gastroesophageal flap valve was visualized                            endoscopically and classified as Hill Grade II                            (fold present, opens  with respiration). Complications:            No immediate complications. Estimated Blood Loss:     Estimated blood loss was minimal. Impression:               - No endoscopic esophageal abnormality to explain                            patient's dysphagia. Esophagus dilated. Dilated.                           - Gastritis. Biopsied.                           -  Erythematous mucosa in the antrum. Biopsied.                           - The examination was otherwise normal.                           - he has severe anterior cervical osteophytes                            visible on a neck CT and I think those are cause of                            dysphagia. Recommendation:           - Patient has a contact number available for                            emergencies. The signs and symptoms of potential                            delayed complications were discussed with the                            patient. Return to normal activities tomorrow.                            Written discharge instructions were provided to the                            patient.                           - Clear liquids x 1 hour then soft foods rest of                            day. Start prior diet tomorrow.                           - Continue present medications.                           - Await pathology results.                           - Diet modification, chin tuck w/ swallow - see                            speech therapy if needed Lupita FORBES Commander, MD 04/03/2024 11:30:19 AM This report has been signed electronically.

## 2024-04-03 NOTE — Progress Notes (Signed)
 Called to room to assist during endoscopic procedure.  Patient ID and intended procedure confirmed with present staff. Received instructions for my participation in the procedure from the performing physician.

## 2024-04-04 ENCOUNTER — Telehealth: Payer: Self-pay

## 2024-04-04 NOTE — Telephone Encounter (Signed)
  Follow up Call-     04/03/2024   10:15 AM 01/11/2024    2:04 PM  Call back number  Post procedure Call Back phone  # [phone]  Permission to leave phone message Yes Yes     Patient questions:  Do you have a fever, pain , or abdominal swelling? No. Pain Score  0 *  Have you tolerated food without any problems? Yes.    Have you been able to return to your normal activities? Yes.    Do you have any questions about your discharge instructions: Diet   No. Medications  No. Follow up visit  No.  Do you have questions or concerns about your Care? No.  Actions: * If pain score is 4 or above: No action needed, pain <4.

## 2024-04-07 LAB — SURGICAL PATHOLOGY

## 2024-04-08 ENCOUNTER — Encounter: Payer: Self-pay | Admitting: Family Medicine

## 2024-04-08 ENCOUNTER — Ambulatory Visit: Admitting: Family Medicine

## 2024-04-08 VITALS — BP 130/68 | HR 66 | Temp 98.1°F | Wt 190.1 lb

## 2024-04-08 DIAGNOSIS — K2970 Gastritis, unspecified, without bleeding: Secondary | ICD-10-CM | POA: Diagnosis not present

## 2024-04-08 DIAGNOSIS — E119 Type 2 diabetes mellitus without complications: Secondary | ICD-10-CM | POA: Diagnosis not present

## 2024-04-08 DIAGNOSIS — R5383 Other fatigue: Secondary | ICD-10-CM | POA: Diagnosis not present

## 2024-04-08 DIAGNOSIS — D649 Anemia, unspecified: Secondary | ICD-10-CM

## 2024-04-08 DIAGNOSIS — Z7984 Long term (current) use of oral hypoglycemic drugs: Secondary | ICD-10-CM

## 2024-04-08 LAB — CBC WITH DIFFERENTIAL/PLATELET
Basophils Absolute: 0.1 K/uL (ref 0.0–0.1)
Basophils Relative: 0.9 % (ref 0.0–3.0)
Eosinophils Absolute: 0.2 K/uL (ref 0.0–0.7)
Eosinophils Relative: 2.4 % (ref 0.0–5.0)
HCT: 38.5 % — ABNORMAL LOW (ref 39.0–52.0)
Hemoglobin: 12.8 g/dL — ABNORMAL LOW (ref 13.0–17.0)
Lymphocytes Relative: 21.6 % (ref 12.0–46.0)
Lymphs Abs: 2 K/uL (ref 0.7–4.0)
MCHC: 33.3 g/dL (ref 30.0–36.0)
MCV: 94.1 fl (ref 78.0–100.0)
Monocytes Absolute: 0.5 K/uL (ref 0.1–1.0)
Monocytes Relative: 6 % (ref 3.0–12.0)
Neutro Abs: 6.4 K/uL (ref 1.4–7.7)
Neutrophils Relative %: 69.1 % (ref 43.0–77.0)
Platelets: 349 K/uL (ref 150.0–400.0)
RBC: 4.09 Mil/uL — ABNORMAL LOW (ref 4.22–5.81)
RDW: 13.3 % (ref 11.5–15.5)
WBC: 9.2 K/uL (ref 4.0–10.5)

## 2024-04-08 LAB — POCT GLYCOSYLATED HEMOGLOBIN (HGB A1C): Hemoglobin A1C: 7.5 % — AB (ref 4.0–5.6)

## 2024-04-08 LAB — COMPREHENSIVE METABOLIC PANEL WITH GFR
ALT: 35 U/L (ref 0–53)
AST: 19 U/L (ref 0–37)
Albumin: 4.6 g/dL (ref 3.5–5.2)
Alkaline Phosphatase: 88 U/L (ref 39–117)
BUN: 20 mg/dL (ref 6–23)
CO2: 25 meq/L (ref 19–32)
Calcium: 9.8 mg/dL (ref 8.4–10.5)
Chloride: 103 meq/L (ref 96–112)
Creatinine, Ser: 1.01 mg/dL (ref 0.40–1.50)
GFR: 76.4 mL/min (ref >60.00)
Glucose, Bld: 157 mg/dL — ABNORMAL HIGH (ref 70–99)
Potassium: 4.3 meq/L (ref 3.5–5.1)
Sodium: 136 meq/L (ref 135–145)
Total Bilirubin: 0.5 mg/dL (ref 0.2–1.2)
Total Protein: 8.2 g/dL (ref 6.0–8.3)

## 2024-04-08 NOTE — Patient Instructions (Signed)
 A1C today 7.5 (goal < 7.0)  Follow up with GI regarding H Pylori from recent EGD

## 2024-04-08 NOTE — Progress Notes (Signed)
 Established Patient Office Visit  Subjective   Patient ID: Joshua [name], male    DOB: 09/14/1955  Age: 68 y.o. MRN: [id number]  Chief Complaint  Patient presents with   Medical Management of Chronic Issues    HPI   Joshua [name] has a history of hypertension, CVA, diverticulosis bleed, history of complicated diverticulitis, type 2 diabetes, osteoarthritis, psoriatic arthritis, history of bladder cancer, hyperlipidemia.  He had recent colostomy takedown.  He seen be doing well from that but developed lower GI bleed which was felt to be most likely diverticulosis related.  Admitted 10-21 through 10-23 and hemoglobin remained stable.  His discharge hemoglobin around 10.1.  He had no obvious bleeding since then.  He had some recurrent dysphagia and saw GI in follow-up after his hospitalization and had EGD done on October 30.  There was mention of gastritis changes and pathology showed H. pylori associated gastritis.  Has not heard back from GI since EGD yet.  Generally feels lethargic and at times dizzy.  He wonders if some of this may be related to his recent stroke and perhaps surgery as well.  He is also aware he had some recent anemia.  He has had occasional epigastric burning recently.  Does take baby aspirin but no other nonsteroidals.  Essentially no alcohol use.  Past Medical History:  Diagnosis Date   Anal fistula    Chronic kidney disease    Hidradenitis    History of bladder cancer 2009   urologist-- dr rosalind---  s/p TURBT 2010,  recurrence in office 2014 ,  none since per pt   History of colonic polyps    History of COVID-19 04/2019   per pt had covid pneumonia recovered at home, no oxygen, symptoms resolved   History of diverticulitis of colon    History of hypertension 04/05/2009   per the pt he lost 55 lbs and htn has been reversed   History of kidney stones    Hydradenitis    Hyperlipidemia 04/05/2009   pt states he does not take med for cholesterol   Hypertension     Lower GI bleed 03/2024   OA (osteoarthritis) 04/05/2009   Pneumonia    Psoriatic arthritis (HCC)    rheumonotologist--- dr ronal jacob   Stroke [facility])    2024   Type 2 diabetes mellitus (HCC) 04/05/2009   followed by pcp---  (01-31-2021  per pt only checks blood sugar once weekly, last fasting sugar-- 122)   Past Surgical History:  Procedure Laterality Date   COLONOSCOPY     last one 11-04-2020  by gessner   CYSTOSCOPY WITH INDOCYANINE GREEN  IMAGING (ICG) N/A 01/17/2024   Procedure: CYSTOSCOPY WITH INDOCYANINE GREEN  IMAGING (ICG);  Surgeon: Shane Steffan BROCKS, MD;  Location: [facility];  Service: Urology;  Laterality: N/A;  FIREFLYER   EXTRACORPOREAL SHOCK WAVE LITHOTRIPSY  2015   FLEXIBLE SIGMOIDOSCOPY N/A 01/17/2024   Procedure: KINGSTON SIDE;  Surgeon: Teresa Lonni HERO, MD;  Location: [facility];  Service: General;  Laterality: N/A;   INCISION AND DRAINAGE ABSCESS N/A 02/03/2021   Procedure: EXCISION OF PERIANAL AND PERINEAL HYDRADENITIS, INTERROGATION OF PERIANAL WOUNDS;  Surgeon: Teresa Lonni HERO, MD;  Location: [facility];  Service: General;  Laterality: N/A;   LAPAROSCOPIC LYSIS OF ADHESIONS N/A 01/17/2024   Procedure: LYSIS, ADHESIONS, LAPAROSCOPIC;  Surgeon: Teresa Lonni HERO, MD;  Location: [facility];  Service: General;  Laterality: N/A;   PARTIAL COLECTOMY N/A 02/26/2023   Procedure: EXPLORATORY LAPAROTOMY; PARTIAL COLECTOMY; COLOSTOMY;  Surgeon: Vernetta Berg, MD;  Location: [facility];  Service: General;  Laterality: N/A;   RECTAL EXAM UNDER ANESTHESIA  02/03/2021   Procedure: ANORECTAL EXAM UNDER ANESTHESIA;  Surgeon: Teresa Lonni HERO, MD;  Location: [facility];  Service: General;;   TONSILLECTOMY     child   TRANSURETHRAL RESECTION OF BLADDER TUMOR  02/2008   XI ROBOTIC ASSISTED COLOSTOMY TAKEDOWN N/A 01/17/2024   Procedure: CLOSURE, COLOSTOMY, ROBOT-ASSISTED;  Surgeon: Teresa Lonni HERO, MD;  Location: [facility];  Service: General;   Laterality: N/A;    reports that he has been smoking cigars. He has never used smokeless tobacco. He reports current alcohol use of about 14.0 standard drinks of alcohol per week. He reports that he does not use drugs. family history includes Arthritis in his father; Crohn's disease in his daughter; Diabetes in his mother; Hypertension in his mother. Allergies  Allergen Reactions   Penicillins Hives    And fever blisters   Adalimumab Other (See Comments)    Wasn't effective (Humira)   Metformin  And Related Nausea Only and Other (See Comments)    Upset the stomach    Review of Systems  Constitutional:  Positive for malaise/fatigue. Negative for chills and fever.  Eyes:  Negative for blurred vision.  Respiratory:  Negative for shortness of breath.   Cardiovascular:  Negative for chest pain.  Gastrointestinal:  Negative for blood in stool, melena, nausea and vomiting.  Genitourinary:  Negative for dysuria.  Neurological:  Positive for dizziness. Negative for weakness and headaches.      Objective:     BP 130/68   Pulse 66   Temp 98.1 F (36.7 C) (Oral)   Wt 190 lb 1.6 oz (86.2 kg)   SpO2 98%   BMI 28.90 kg/m  BP Readings from Last 3 Encounters:  04/08/24 130/68  04/03/24 130/68  03/27/24 (!) 128/56   Wt Readings from Last 3 Encounters:  04/08/24 190 lb 1.6 oz (86.2 kg)  04/03/24 185 lb (83.9 kg)  03/25/24 185 lb 13.6 oz (84.3 kg)      Physical Exam Vitals reviewed.  Constitutional:      General: He is not in acute distress.    Appearance: He is not ill-appearing.  Cardiovascular:     Rate and Rhythm: Normal rate and regular rhythm.  Pulmonary:     Effort: Pulmonary effort is normal.     Breath sounds: Normal breath sounds. No wheezing or rales.  Abdominal:     General: There is no distension.     Palpations: Abdomen is soft.     Tenderness: There is no abdominal tenderness.  Musculoskeletal:     Right lower leg: No edema.     Left lower leg: No edema.   Neurological:     Mental Status: He is alert.      Results for orders placed or performed in visit on 04/08/24  POC HgB A1c  Result Value Ref Range   Hemoglobin A1C 7.5 (A) 4.0 - 5.6 %   HbA1c POC (<> result, manual entry)     HbA1c, POC (prediabetic range)     HbA1c, POC (controlled diabetic range)        The ASCVD Risk score (Arnett DK, et al., 2019) failed to calculate for the following reasons:   Risk score cannot be calculated because patient has a medical history suggesting prior/existing ASCVD    Assessment & Plan:   Problem List Items Addressed This Visit   None Visit Diagnoses  Controlled type 2 diabetes mellitus without complication, without long-term current use of insulin  (HCC)    -  Primary   Relevant Orders   POC HgB A1c (Completed)     Fatigue, unspecified type       Relevant Orders   CMP   TSH     Gastritis, presence of bleeding unspecified, unspecified chronicity, unspecified gastritis type         Anemia, unspecified type       Relevant Orders   CBC with Differential/Platelet   Vitamin B12     Joshua [name] is seen for medical follow-up.  Recent admission for diverticulosis bleed.  Hemoglobin dropped down to around 10.1.  He has not noted any further bleeding.  Generally having significant amount of fatigue and some lightheadedness.  Recommend the following  -Check additional labs with CBC, CMP, TSH, B12 - A1c today 7.5%.  He is currently on Jardiance .  Previous intolerance to metformin .  We discussed other potential medication options but he prefers given this 3 months of lifestyle modification first.  Will set up 65-month follow-up and if not further to goal at that time consider additional medication - Patient will follow-up with GI regarding recent EGD results with H. pylori associated gastritis.  Return in about 3 months (around 07/09/2024).    Wolm Scarlet, MD

## 2024-04-09 ENCOUNTER — Encounter: Payer: Self-pay | Admitting: Internal Medicine

## 2024-04-09 ENCOUNTER — Ambulatory Visit: Payer: Self-pay | Admitting: Family Medicine

## 2024-04-09 LAB — VITAMIN B12: Vitamin B-12: >1500 pg/mL — ABNORMAL HIGH (ref 211–911)

## 2024-04-09 LAB — TSH: TSH: 3.64 u[IU]/mL (ref 0.35–5.50)

## 2024-04-10 ENCOUNTER — Encounter

## 2024-04-10 ENCOUNTER — Ambulatory Visit: Payer: Self-pay | Admitting: Internal Medicine

## 2024-04-10 DIAGNOSIS — B9681 Helicobacter pylori [H. pylori] as the cause of diseases classified elsewhere: Secondary | ICD-10-CM | POA: Insufficient documentation

## 2024-04-10 DIAGNOSIS — K080 Exfoliation of teeth due to systemic causes: Secondary | ICD-10-CM | POA: Diagnosis not present

## 2024-04-10 NOTE — Telephone Encounter (Signed)
 Patient has H. pylori gastritis and needs the following prescription sent in.  He can do a standard H. pylori stool test through our lab.   They use MyChart so you can message that way.   Thank you.  1) Omeprazole 20 mg 2 times a day x 14 d 2) Pepto Bismol 2 tabs (262 mg each) 4 times a day x 14 d 3) Metronidazole  250 mg 4 times a day x 14 d 4) doxycycline  100 mg 2 times a day x 14 d  After 14 d stop omeprazole also  In 4 weeks after treatment completed do H. Pylori stool antigen - dx H. Pylori gastritis

## 2024-04-11 ENCOUNTER — Other Ambulatory Visit: Payer: Self-pay

## 2024-04-11 DIAGNOSIS — B9681 Helicobacter pylori [H. pylori] as the cause of diseases classified elsewhere: Secondary | ICD-10-CM

## 2024-04-11 MED ORDER — OMEPRAZOLE 20 MG PO CPDR: 20.0000 mg | DELAYED_RELEASE_CAPSULE | Freq: Two times a day (BID) | ORAL | 0 refills | Status: DC

## 2024-04-11 MED ORDER — BISMUTH SUBSALICYLATE 262 MG PO TABS: 2.0000 | ORAL_TABLET | Freq: Four times a day (QID) | ORAL | 0 refills | Status: AC

## 2024-04-11 MED ORDER — DOXYCYCLINE HYCLATE 100 MG PO CAPS: 100.0000 mg | ORAL_CAPSULE | Freq: Two times a day (BID) | ORAL | 0 refills | Status: AC

## 2024-04-11 MED ORDER — METRONIDAZOLE 250 MG PO TABS: 250.0000 mg | ORAL_TABLET | Freq: Four times a day (QID) | ORAL | 0 refills | Status: AC

## 2024-04-22 ENCOUNTER — Other Ambulatory Visit: Payer: Self-pay | Admitting: Family Medicine

## 2024-04-24 ENCOUNTER — Ambulatory Visit: Attending: Cardiology

## 2024-04-24 ENCOUNTER — Encounter

## 2024-04-24 DIAGNOSIS — I639 Cerebral infarction, unspecified: Secondary | ICD-10-CM

## 2024-04-24 LAB — CUP PACEART REMOTE DEVICE CHECK
Date Time Interrogation Session: 20251119233833
Implantable Pulse Generator Implant Date: 20250410

## 2024-04-28 DIAGNOSIS — L400 Psoriasis vulgaris: Secondary | ICD-10-CM | POA: Diagnosis not present

## 2024-04-28 DIAGNOSIS — L821 Other seborrheic keratosis: Secondary | ICD-10-CM | POA: Diagnosis not present

## 2024-04-28 DIAGNOSIS — L732 Hidradenitis suppurativa: Secondary | ICD-10-CM | POA: Diagnosis not present

## 2024-04-28 NOTE — Progress Notes (Signed)
 Remote Loop Recorder Transmission

## 2024-04-29 ENCOUNTER — Ambulatory Visit: Payer: Self-pay | Admitting: Cardiology

## 2024-05-08 ENCOUNTER — Other Ambulatory Visit: Payer: Self-pay

## 2024-05-12 ENCOUNTER — Other Ambulatory Visit

## 2024-05-12 DIAGNOSIS — B9681 Helicobacter pylori [H. pylori] as the cause of diseases classified elsewhere: Secondary | ICD-10-CM

## 2024-05-12 DIAGNOSIS — K2970 Gastritis, unspecified, without bleeding: Secondary | ICD-10-CM | POA: Diagnosis not present

## 2024-05-14 ENCOUNTER — Ambulatory Visit: Payer: Self-pay | Admitting: Internal Medicine

## 2024-05-14 LAB — H. PYLORI ANTIGEN, STOOL: H pylori Ag, Stl: NEGATIVE

## 2024-05-15 ENCOUNTER — Encounter: Payer: Self-pay | Admitting: Gastroenterology

## 2024-05-15 ENCOUNTER — Encounter

## 2024-05-15 ENCOUNTER — Ambulatory Visit: Admitting: Gastroenterology

## 2024-05-15 VITALS — BP 124/68 | HR 75 | Ht 68.0 in | Wt 191.4 lb

## 2024-05-15 DIAGNOSIS — R1310 Dysphagia, unspecified: Secondary | ICD-10-CM

## 2024-05-15 DIAGNOSIS — Z8719 Personal history of other diseases of the digestive system: Secondary | ICD-10-CM | POA: Diagnosis not present

## 2024-05-15 DIAGNOSIS — Z8619 Personal history of other infectious and parasitic diseases: Secondary | ICD-10-CM | POA: Diagnosis not present

## 2024-05-15 DIAGNOSIS — B9681 Helicobacter pylori [H. pylori] as the cause of diseases classified elsewhere: Secondary | ICD-10-CM

## 2024-05-15 DIAGNOSIS — R1319 Other dysphagia: Secondary | ICD-10-CM | POA: Insufficient documentation

## 2024-05-15 NOTE — Progress Notes (Signed)
 05/15/2024 Joshua [name] [id number] Jun 28, 1955   Discussed the use of AI scribe software for clinical note transcription with the patient, who gave verbal consent to proceed.  History of Present Illness Joshua [name] is a 68 year old male who presents for a follow-up visit after hospitalization for a gastrointestinal bleed.  He was recently hospitalized for a gastrointestinal bleed, suspected to be diverticular in origin. After discharge, he experienced some light bleeding that resolved completely within a few days, and his stool returned to normal. He has not had any further issues since then. He is concerned about the possibility of recurrent diverticular bleeding, noting the absence of pain and the large volume of blood during the initial episode. He is not currently on any blood thinners except for aspirin .  Last Hgb on 11/4 was 12.8 grams.  During his hospital stay, he had an EGD for dysphagia and was diagnosed with gastritis and tested positive for H. pylori, for which he received treatment. A follow-up stool study confirmed the eradication of H. pylori. He possibly acquired that during his time in the [place], which has been treated successfully. He is not on any reflux medication at present.  Of note, was treated with PPI, doxycycline , Flagyl , bismuth  for his Hpylori.  He was also having difficulty swallowing, which was addressed by esophageal dilation. This intervention provided significant relief.   Colonoscopy 01/2024: - Patent end sigmoid colostomy. - Two diminutive polyps in the transverse colon and in the cecum, removed with a cold snare. Resected and retrieved. - Diverticulosis in the sigmoid colon, in the descending colon and in the transverse colon. - The examination was otherwise normal. Examined rectal pouch but limited views due to retained mucous - Personal history of colonic polyps. 10/ 2015 - 7 mm ssp - 11/ 30/ 21 30 mm cecal adenoma June 2022- diminutive SSP no  residual adenoma  EGD 03/2024: - No endoscopic esophageal abnormality to explain patient' s dysphagia. Esophagus dilated. Dilated. - Gastritis. Biopsied. - Erythematous mucosa in the antrum. Biopsied. - The examination was otherwise normal. - he has severe anterior cervical osteophytes visible on a neck CT and I think those are cause of dysphagia.    1. Surgical [P], gastric antrum :       H.PYLORI ASSOCIATED CHRONIC ACTIVE GASTRITIS.       H. PYLORI IDENTIFIED ON H&E STAIN.       NEGATIVE FOR INTESTINAL METAPLASIA OR DYSPLASIA.        2. Surgical [P], gastric body :       H.PYLORI ASSOCIATED CHRONIC ACTIVE GASTRITIS.       H. PYLORI IDENTIFIED ON H&E STAIN.       NEGATIVE FOR INTESTINAL METAPLASIA OR DYSPLASIA.   Past Medical History:  Diagnosis Date   Anal fistula    Chronic kidney disease    Diverticulitis    Gastritis    Hidradenitis    History of bladder cancer 2009   urologist-- dr rosalind---  s/p TURBT 2010,  recurrence in office 2014 ,  none since per pt   History of colonic polyps    History of COVID-19 04/2019   per pt had covid pneumonia recovered at home, no oxygen, symptoms resolved   History of diverticulitis of colon    History of hypertension 04/05/2009   per the pt he lost 55 lbs and htn has been reversed   History of kidney stones    Hydradenitis    Hyperlipidemia 04/05/2009   pt  states he does not take med for cholesterol   Hypertension    Lower GI bleed 03/2024   OA (osteoarthritis) 04/05/2009   Pneumonia    Psoriatic arthritis (HCC)    rheumonotologist--- dr ronal jacob   Stroke [facility])    2024   Type 2 diabetes mellitus (HCC) 04/05/2009   followed by pcp---  (01-31-2021  per pt only checks blood sugar once weekly, last fasting sugar-- 122)   Past Surgical History:  Procedure Laterality Date   COLONOSCOPY     last one 11-04-2020  by gessner   CYSTOSCOPY WITH INDOCYANINE GREEN  IMAGING (ICG) N/A 01/17/2024   Procedure: CYSTOSCOPY WITH INDOCYANINE GREEN   IMAGING (ICG);  Surgeon: Shane Steffan BROCKS, MD;  [facility];  Service: Urology;  Laterality: N/A;  FIREFLYER   EXTRACORPOREAL SHOCK WAVE LITHOTRIPSY  2015   FLEXIBLE SIGMOIDOSCOPY N/A 01/17/2024   Procedure: KINGSTON SIDE;  Surgeon: Teresa Lonni HERO, MD;  [facility];  Service: General;  Laterality: N/A;   INCISION AND DRAINAGE ABSCESS N/A 02/03/2021   Procedure: EXCISION OF PERIANAL AND PERINEAL HYDRADENITIS, INTERROGATION OF PERIANAL WOUNDS;  Surgeon: Teresa Lonni HERO, MD;  Location: [facility];  Service: General;  Laterality: N/A;   LAPAROSCOPIC LYSIS OF ADHESIONS N/A 01/17/2024   Procedure: LYSIS, ADHESIONS, LAPAROSCOPIC;  Surgeon: Teresa Lonni HERO, MD;  [facility];  Service: General;  Laterality: N/A;   PARTIAL COLECTOMY N/A 02/26/2023   Procedure: EXPLORATORY LAPAROTOMY; PARTIAL COLECTOMY; COLOSTOMY;  Surgeon: Vernetta Berg, MD;  [facility];  Service: General;  Laterality: N/A;   RECTAL EXAM UNDER ANESTHESIA  02/03/2021   Procedure: ANORECTAL EXAM UNDER ANESTHESIA;  Surgeon: Teresa Lonni HERO, MD;  Location: [facility];  Service: General;;   TONSILLECTOMY     child   TRANSURETHRAL RESECTION OF BLADDER TUMOR  02/2008   XI ROBOTIC ASSISTED COLOSTOMY TAKEDOWN N/A 01/17/2024   Procedure: CLOSURE, COLOSTOMY, ROBOT-ASSISTED;  Surgeon: Teresa Lonni HERO, MD;  [facility];  Service: General;  Laterality: N/A;    reports that he has been smoking cigars. He has never used smokeless tobacco. He reports current alcohol use of about 14.0 standard drinks of alcohol per week. He reports that he does not use drugs. family history includes Arthritis in his father; Crohn's disease in his daughter; Diabetes in his mother; Hypertension in his mother. Allergies[1]    Outpatient Encounter Medications as of 05/15/2024  Medication Sig   clindamycin  (CLEOCIN  T) 1 % lotion SMARTSIG:liberally Topical Daily   triamcinolone   cream (KENALOG ) 0.1 % Apply topically.   acetaminophen  (TYLENOL ) 500 MG tablet Take 2 tablets (1,000 mg total) by mouth every 6 (six) hours as needed.   aspirin  81 MG chewable tablet Chew 1 tablet (81 mg total) by mouth in the morning.   BAYER MICROLET LANCETS lancets Check Blood Sugar Once Daily.   CINNAMON PO Take 1 capsule by mouth daily.   COSENTYX SENSOREADY, 300 MG, 150 MG/ML SOAJ Inject 300 mg into the skin every 28 (twenty-eight) days.   empagliflozin  (JARDIANCE ) 25 MG TABS tablet Take 1 tablet (25 mg total) by mouth daily before breakfast.   MOTRIN  IB 200 MG tablet Take 800 mg by mouth every 6 (six) hours as needed for mild pain (pain score 1-3) (or headaches).   Multiple Vitamin (MULTIVITAMIN) tablet Take 1 tablet by mouth daily with breakfast.   ONETOUCH ULTRA test strip USE AS DIRECTED TO CHECK BLOOD GLUCOSE ONE TIME DAILY   polyethylene glycol (MIRALAX  / GLYCOLAX ) 17  g packet Take 17 g by mouth daily as needed for mild constipation.   rosuvastatin  (CRESTOR ) 10 MG tablet TAKE 1 TABLET(10 MG) BY MOUTH DAILY   Wheat Dextrin (BENEFIBER) POWD Take 1 Scoop by mouth in the morning. Mix 1 scoopful (tablespoonful) as directed into a desired beverage and drink by mouth in the morning   [DISCONTINUED] omeprazole  (PRILOSEC) 20 MG capsule Take 1 capsule (20 mg total) by mouth 2 (two) times daily before a meal for 14 days.   No facility-administered encounter medications on file as of 05/15/2024.     REVIEW OF SYSTEMS  : All other systems reviewed and negative except where noted in the History of Present Illness.   PHYSICAL EXAM: BP 124/68   Pulse 75   Ht 5' 8 (1.727 m)   Wt 191 lb 6 oz (86.8 kg)   BMI 29.10 kg/m  General: Well developed male in no acute distress Head: Normocephalic and atraumatic Eyes:  Sclerae anicteric, conjunctiva pink. Ears: Normal auditory acuity Musculoskeletal: Symmetrical with no gross deformities  Skin: No lesions on visible extremities Neurological:  Alert oriented x 4, grossly non-focal Psychological:  Alert and cooperative. Normal mood and affect  Assessment & Plan Diverticular gastrointestinal bleeding Recent episode likely due to diverticular disease, resolved without recurrence. No current symptoms.  Emphasized maintaining soft stools and adequate hydration to prevent recurrence.  Hgb 11/4 was 12.8 grams. - Encouraged maintaining soft stools and adequate hydration.  Dysphagia Improved following esophageal dilation. No strictures observed on endoscopy. Possible extrinsic compression from cervical bone spurs. Discussed potential need for repeat dilation if symptoms recur, with variable intervals between procedures. - Monitor for recurrence of dysphagia symptoms. - Will consider repeat esophageal dilation if symptoms recur.  Helicobacter pylori gastritis, resolved H. pylori gastritis treated successfully with metronidazole , doxycycline , PPI, bismuth . Follow-up stool study negative for H. pylori.   **Follow-up prn for now.   CC:  Burchette, Wolm ORN, MD        [1]  Allergies Allergen Reactions   Penicillins Hives    And fever blisters   Adalimumab Other (See Comments)    Wasn't effective (Humira)   Metformin  And Related Nausea Only and Other (See Comments)    Upset the stomach

## 2024-05-15 NOTE — Patient Instructions (Signed)
 Follow up as needed.   _______________________________________________________  If your blood pressure at your visit was 140/90 or greater, please contact your primary care physician to follow up on this.  _______________________________________________________  If you are age 68 or older, your body mass index should be between 23-30. Your Body mass index is 29.1 kg/m. If this is out of the aforementioned range listed, please consider follow up with your Primary Care Provider.  If you are age 51 or younger, your body mass index should be between 19-25. Your Body mass index is 29.1 kg/m. If this is out of the aformentioned range listed, please consider follow up with your Primary Care Provider.   ________________________________________________________  The [facility] providers would like to encourage you to use MYCHART to communicate with providers for non-urgent requests or questions.  Due to long hold times on the telephone, sending your provider a message by [facility] may be a faster and more efficient way to get a response.  Please allow 48 business hours for a response.  Please remember that this is for non-urgent requests.  _______________________________________________________  [facility] is using a team-based approach to care.  Your team is made up of your doctor and two to three APPS. Our APPS (Nurse Practitioners and Physician Assistants) work with your physician to ensure care continuity for you. They are fully qualified to address your health concerns and develop a treatment plan. They communicate directly with your gastroenterologist to care for you. Seeing the Advanced Practice Practitioners on your physician's team can help you by facilitating care more promptly, often allowing for earlier appointments, access to diagnostic testing, procedures, and other specialty referrals.

## 2024-05-25 ENCOUNTER — Ambulatory Visit

## 2024-05-25 DIAGNOSIS — I639 Cerebral infarction, unspecified: Secondary | ICD-10-CM

## 2024-05-26 ENCOUNTER — Encounter

## 2024-05-27 LAB — CUP PACEART REMOTE DEVICE CHECK
Date Time Interrogation Session: 20251220233218
Implantable Pulse Generator Implant Date: 20250410

## 2024-05-28 NOTE — Progress Notes (Signed)
 Remote Loop Recorder Transmission

## 2024-06-01 ENCOUNTER — Ambulatory Visit: Payer: Self-pay | Admitting: Cardiology

## 2024-06-19 ENCOUNTER — Encounter

## 2024-06-25 ENCOUNTER — Ambulatory Visit

## 2024-06-25 DIAGNOSIS — I639 Cerebral infarction, unspecified: Secondary | ICD-10-CM

## 2024-06-26 LAB — CUP PACEART REMOTE DEVICE CHECK
Date Time Interrogation Session: 20260120234019
Implantable Pulse Generator Implant Date: 20250410

## 2024-06-28 NOTE — Progress Notes (Signed)
 Remote Loop Recorder Transmission

## 2024-06-30 ENCOUNTER — Ambulatory Visit: Payer: Self-pay | Admitting: Cardiology

## 2024-07-09 ENCOUNTER — Ambulatory Visit: Admitting: Family Medicine

## 2024-07-09 ENCOUNTER — Encounter: Payer: Self-pay | Admitting: Family Medicine

## 2024-07-09 VITALS — BP 124/60 | HR 65 | Temp 97.8°F | Wt 197.0 lb

## 2024-07-09 DIAGNOSIS — E1165 Type 2 diabetes mellitus with hyperglycemia: Secondary | ICD-10-CM

## 2024-07-09 DIAGNOSIS — Z8673 Personal history of transient ischemic attack (TIA), and cerebral infarction without residual deficits: Secondary | ICD-10-CM

## 2024-07-09 DIAGNOSIS — E785 Hyperlipidemia, unspecified: Secondary | ICD-10-CM

## 2024-07-09 LAB — POCT GLYCOSYLATED HEMOGLOBIN (HGB A1C): Hemoglobin A1C: 9.1 % — AB (ref 4.0–5.6)

## 2024-07-09 MED ORDER — RYBELSUS 3 MG PO TABS: 3.0000 mg | ORAL_TABLET | Freq: Every day | ORAL | 2 refills | Status: AC

## 2024-07-09 NOTE — Progress Notes (Signed)
 "  Established Patient Office Visit  Subjective   Patient ID: Joshua [name], male    DOB: 1955-10-02  Age: 69 y.o. MRN: [id number]  Chief Complaint  Patient presents with   Medical Management of Chronic Issues    HPI    Joshua [name] is seen for medical follow-up.  He has history of hypertension, type 2 diabetes, CVA, history of diverticulosis of the colon with prior hemorrhage, history of recurrent diverticulitis, psoriatic arthritis, history of bladder cancer, hyperlipidemia.  Generally doing well although has had some recent poor dietary compliance regarding his diabetes.  His fasting blood sugars have been ranging generally 150s to 174.  Currently takes Jardiance .  Did not tolerate any form of metformin .  He was on Ozempic  briefly but felt more lethargic on that.  He is going to the gym fairly regularly and weight is actually up slightly compared with previous weights.  He attributes some of this to poor compliance through the holidays with diet.Joshua [name] Has hyperlipidemia treated with statin with most recent LDL 65 and total cholesterol 152.  Is on rosuvastatin  10 mg daily.  His only current regular medications are aspirin  81 mg daily, chronic rosuvastatin  10 mg daily, and Jardiance  25 mg daily.  Denies any recent chest pains or shortness of breath.  Past Medical History:  Diagnosis Date   Anal fistula    Chronic kidney disease    Diverticulitis    Gastritis    Hidradenitis    History of bladder cancer 2009   urologist-- dr rosalind---  s/p TURBT 2010,  recurrence in office 2014 ,  none since per pt   History of colonic polyps    History of COVID-19 04/2019   per pt had covid pneumonia recovered at home, no oxygen, symptoms resolved   History of diverticulitis of colon    History of hypertension 04/05/2009   per the pt he lost 55 lbs and htn has been reversed   History of kidney stones    Hydradenitis    Hyperlipidemia 04/05/2009   pt states he does not take med for cholesterol    Hypertension    Lower GI bleed 03/2024   OA (osteoarthritis) 04/05/2009   Pneumonia    Psoriatic arthritis (HCC)    rheumonotologist--- dr ronal jacob   Stroke [facility])    2024   Type 2 diabetes mellitus (HCC) 04/05/2009   followed by pcp---  (01-31-2021  per pt only checks blood sugar once weekly, last fasting sugar-- 122)   Past Surgical History:  Procedure Laterality Date   COLONOSCOPY     last one 11-04-2020  by gessner   CYSTOSCOPY WITH INDOCYANINE GREEN  IMAGING (ICG) N/A 01/17/2024   Procedure: CYSTOSCOPY WITH INDOCYANINE GREEN  IMAGING (ICG);  Surgeon: Shane Steffan BROCKS, MD;  Location: [facility];  Service: Urology;  Laterality: N/A;  FIREFLYER   EXTRACORPOREAL SHOCK WAVE LITHOTRIPSY  2015   FLEXIBLE SIGMOIDOSCOPY N/A 01/17/2024   Procedure: KINGSTON SIDE;  Surgeon: Teresa Lonni HERO, MD;  Location: [facility];  Service: General;  Laterality: N/A;   INCISION AND DRAINAGE ABSCESS N/A 02/03/2021   Procedure: EXCISION OF PERIANAL AND PERINEAL HYDRADENITIS, INTERROGATION OF PERIANAL WOUNDS;  Surgeon: Teresa Lonni HERO, MD;  Location: [facility];  Service: General;  Laterality: N/A;   LAPAROSCOPIC LYSIS OF ADHESIONS N/A 01/17/2024   Procedure: LYSIS, ADHESIONS, LAPAROSCOPIC;  Surgeon: Teresa Lonni HERO, MD;  Location: [facility];  Service: General;  Laterality: N/A;   PARTIAL COLECTOMY N/A 02/26/2023   Procedure: EXPLORATORY LAPAROTOMY;  PARTIAL COLECTOMY; COLOSTOMY;  Surgeon: Vernetta Berg, MD;  Location: [facility];  Service: General;  Laterality: N/A;   RECTAL EXAM UNDER ANESTHESIA  02/03/2021   Procedure: ANORECTAL EXAM UNDER ANESTHESIA;  Surgeon: Teresa Lonni HERO, MD;  Location: [facility];  Service: General;;   TONSILLECTOMY     child   TRANSURETHRAL RESECTION OF BLADDER TUMOR  02/2008   XI ROBOTIC ASSISTED COLOSTOMY TAKEDOWN N/A 01/17/2024   Procedure: CLOSURE, COLOSTOMY, ROBOT-ASSISTED;  Surgeon: Teresa Lonni HERO, MD;  Location: [facility];   Service: General;  Laterality: N/A;    reports that he has been smoking cigars. He has never used smokeless tobacco. He reports current alcohol use of about 14.0 standard drinks of alcohol per week. He reports that he does not use drugs. family history includes Arthritis in his father; Crohn's disease in his daughter; Diabetes in his mother; Hypertension in his mother. Allergies[1]  Review of Systems  Constitutional:  Negative for malaise/fatigue.  Eyes:  Negative for blurred vision.  Respiratory:  Negative for shortness of breath.   Cardiovascular:  Negative for chest pain.  Neurological:  Negative for dizziness, weakness and headaches.      Objective:     BP 124/60   Pulse 65   Temp 97.8 F (36.6 C) (Oral)   Wt 197 lb (89.4 kg)   SpO2 99%   BMI 29.95 kg/m  BP Readings from Last 3 Encounters:  07/09/24 124/60  05/15/24 124/68  04/08/24 130/68   Wt Readings from Last 3 Encounters:  07/09/24 197 lb (89.4 kg)  05/15/24 191 lb 6 oz (86.8 kg)  04/08/24 190 lb 1.6 oz (86.2 kg)      Physical Exam Vitals reviewed.  Constitutional:      General: He is not in acute distress.    Appearance: He is well-developed. He is not ill-appearing.  Eyes:     Pupils: Pupils are equal, round, and reactive to light.  Neck:     Thyroid : No thyromegaly.  Cardiovascular:     Rate and Rhythm: Normal rate and regular rhythm.  Pulmonary:     Effort: Pulmonary effort is normal. No respiratory distress.     Breath sounds: Normal breath sounds. No wheezing or rales.  Musculoskeletal:     Cervical back: Neck supple.     Right lower leg: No edema.     Left lower leg: No edema.  Skin:    Comments: Feet reveal no skin lesions. Good distal foot pulses. Good capillary refill. No calluses. Normal sensation with monofilament testing   Neurological:     Mental Status: He is alert and oriented to person, place, and time.      Results for orders placed or performed in visit on 07/09/24  POC HgB  A1c  Result Value Ref Range   Hemoglobin A1C 9.1 (A) 4.0 - 5.6 %   HbA1c POC (<> result, manual entry)     HbA1c, POC (prediabetic range)     HbA1c, POC (controlled diabetic range)      Last CBC Lab Results  Component Value Date   WBC 9.2 04/08/2024   HGB 12.8 (L) 04/08/2024   HCT 38.5 (L) 04/08/2024   MCV 94.1 04/08/2024   MCH 31.7 03/27/2024   RDW 13.3 04/08/2024   PLT 349.0 04/08/2024   Last metabolic panel Lab Results  Component Value Date   GLUCOSE 157 (H) 04/08/2024   NA 136 04/08/2024   K 4.3 04/08/2024   CL 103 04/08/2024   CO2  25 04/08/2024   BUN 20 04/08/2024   CREATININE 1.01 04/08/2024   GFR 76.40 04/08/2024   CALCIUM  9.8 04/08/2024   PROT 8.2 04/08/2024   ALBUMIN 4.6 04/08/2024   BILITOT 0.5 04/08/2024   ALKPHOS 88 04/08/2024   AST 19 04/08/2024   ALT 35 04/08/2024   ANIONGAP 12 03/26/2024   Last lipids Lab Results  Component Value Date   CHOL 152 02/06/2024   HDL 43.60 02/06/2024   LDLCALC 65 02/06/2024   LDLDIRECT 131.0 09/02/2018   TRIG 217.0 (H) 02/06/2024   CHOLHDL 3 02/06/2024   Last hemoglobin A1c Lab Results  Component Value Date   HGBA1C 9.1 (A) 07/09/2024      The ASCVD Risk score (Arnett DK, et al., 2019) failed to calculate for the following reasons:   Risk score cannot be calculated because patient has a medical history suggesting prior/existing ASCVD   * - Cholesterol units were assumed    Assessment & Plan:   #1 type 2 diabetes poorly controlled with A1c 9.1%.  Patient currently on Jardiance .  Previous intolerance with multiple formulations of metformin .  He took Ozempic  but felt more lethargic.  He is wanting to avoid medications that could promote weight gain such as sulfonylureas.  We did discuss possible option of oral GLP-1 medication with Rybelsus  and he agrees to try.  No known contraindications.  Will start 3 mg once daily.  Give feedback in 1 month if tolerating well consider titration at that point to 7 mg daily.   Set up 74-month follow-up and recheck A1c at that point.  Continue yearly urine microalbumin screen and diabetic eye check.  Foot exam today is normal  #2 hyperlipidemia on rosuvastatin  10 mg daily.  Recent LDL 65.  Continue low saturated fat diet.  #3 history of CVA.  We discussed secondary prevention.  Continue aspirin .  Our goals are blood pressure less than 130/80, A1c less than 6.5% ideally, and LDL cholesterol less than 70   Return in about 3 months (around 10/06/2024).    Wolm Scarlet, MD     [1]  Allergies Allergen Reactions   Penicillins Hives    And fever blisters   Adalimumab Other (See Comments)    Wasn't effective (Humira)   Metformin  And Related Nausea Only and Other (See Comments)    Upset the stomach   "

## 2024-07-09 NOTE — Patient Instructions (Signed)
 A1C was 9.1  Start the Rybelsus  3mg  once daily  Give me feedback in  one month if tolerating well and wanting to increase at that point to 7 mg daily.   Set up 3 month follow up.

## 2024-07-26 ENCOUNTER — Ambulatory Visit
